# Patient Record
Sex: Male | Born: 1940 | Race: White | Hispanic: No | Marital: Married | Smoking: Former smoker
Health system: Southern US, Community
[De-identification: ages and names within clinical notes are randomized; demographics above are authoritative.]

## PROBLEM LIST (undated history)

## (undated) DIAGNOSIS — F419 Anxiety disorder, unspecified: Secondary | ICD-10-CM

## (undated) DIAGNOSIS — G4730 Sleep apnea, unspecified: Secondary | ICD-10-CM

## (undated) DIAGNOSIS — I509 Heart failure, unspecified: Secondary | ICD-10-CM

## (undated) DIAGNOSIS — I5022 Chronic systolic (congestive) heart failure: Secondary | ICD-10-CM

## (undated) DIAGNOSIS — E079 Disorder of thyroid, unspecified: Secondary | ICD-10-CM

## (undated) DIAGNOSIS — Z95810 Presence of automatic (implantable) cardiac defibrillator: Secondary | ICD-10-CM

## (undated) DIAGNOSIS — J449 Chronic obstructive pulmonary disease, unspecified: Secondary | ICD-10-CM

## (undated) DIAGNOSIS — J069 Acute upper respiratory infection, unspecified: Secondary | ICD-10-CM

## (undated) DIAGNOSIS — H9190 Unspecified hearing loss, unspecified ear: Secondary | ICD-10-CM

## (undated) DIAGNOSIS — C9110 Chronic lymphocytic leukemia of B-cell type not having achieved remission: Secondary | ICD-10-CM

## (undated) DIAGNOSIS — Z9989 Dependence on other enabling machines and devices: Secondary | ICD-10-CM

## (undated) HISTORY — DX: Acute upper respiratory infection, unspecified: J06.9

## (undated) HISTORY — DX: Dependence on other enabling machines and devices: Z99.89

## (undated) HISTORY — DX: Disorder of thyroid, unspecified: E07.9

## (undated) HISTORY — DX: Heart failure, unspecified: I50.9

## (undated) HISTORY — DX: Chronic lymphocytic leukemia of B-cell type not having achieved remission: C91.10

## (undated) HISTORY — DX: Anxiety disorder, unspecified: F41.9

## (undated) HISTORY — DX: Sleep apnea, unspecified: G47.30

---

## 2003-11-20 ENCOUNTER — Other Ambulatory Visit: Payer: Self-pay

## 2004-03-05 ENCOUNTER — Other Ambulatory Visit: Payer: Self-pay

## 2004-09-17 ENCOUNTER — Ambulatory Visit: Payer: Self-pay | Admitting: Internal Medicine

## 2004-09-29 ENCOUNTER — Ambulatory Visit: Payer: Self-pay | Admitting: Internal Medicine

## 2004-11-05 ENCOUNTER — Ambulatory Visit: Payer: Self-pay | Admitting: Cardiology

## 2005-05-15 ENCOUNTER — Ambulatory Visit: Payer: Self-pay | Admitting: Internal Medicine

## 2005-05-30 ENCOUNTER — Ambulatory Visit: Payer: Self-pay | Admitting: Internal Medicine

## 2005-09-11 ENCOUNTER — Ambulatory Visit: Payer: Self-pay

## 2005-10-22 ENCOUNTER — Ambulatory Visit: Payer: Self-pay | Admitting: General Practice

## 2005-11-11 ENCOUNTER — Ambulatory Visit: Payer: Self-pay | Admitting: Internal Medicine

## 2005-11-27 ENCOUNTER — Ambulatory Visit: Payer: Self-pay | Admitting: Internal Medicine

## 2006-07-09 ENCOUNTER — Ambulatory Visit: Payer: Self-pay | Admitting: Internal Medicine

## 2006-07-30 ENCOUNTER — Ambulatory Visit: Payer: Self-pay | Admitting: Internal Medicine

## 2006-11-24 ENCOUNTER — Ambulatory Visit: Payer: Self-pay | Admitting: Internal Medicine

## 2006-11-28 ENCOUNTER — Ambulatory Visit: Payer: Self-pay | Admitting: Internal Medicine

## 2007-05-26 ENCOUNTER — Ambulatory Visit: Payer: Self-pay | Admitting: Internal Medicine

## 2007-05-31 ENCOUNTER — Ambulatory Visit: Payer: Self-pay | Admitting: Internal Medicine

## 2007-10-13 ENCOUNTER — Ambulatory Visit: Payer: Self-pay | Admitting: Internal Medicine

## 2007-10-14 ENCOUNTER — Ambulatory Visit: Payer: Self-pay | Admitting: Internal Medicine

## 2007-10-31 ENCOUNTER — Ambulatory Visit: Payer: Self-pay | Admitting: Internal Medicine

## 2007-11-28 ENCOUNTER — Ambulatory Visit: Payer: Self-pay | Admitting: Internal Medicine

## 2008-04-05 ENCOUNTER — Ambulatory Visit: Payer: Self-pay | Admitting: Internal Medicine

## 2008-04-25 ENCOUNTER — Ambulatory Visit: Payer: Self-pay | Admitting: Internal Medicine

## 2008-05-30 ENCOUNTER — Ambulatory Visit: Payer: Self-pay | Admitting: Internal Medicine

## 2008-06-26 ENCOUNTER — Ambulatory Visit: Payer: Self-pay | Admitting: Internal Medicine

## 2008-06-29 ENCOUNTER — Ambulatory Visit: Payer: Self-pay | Admitting: Internal Medicine

## 2008-10-30 ENCOUNTER — Ambulatory Visit: Payer: Self-pay | Admitting: Internal Medicine

## 2008-11-22 ENCOUNTER — Ambulatory Visit: Payer: Self-pay | Admitting: Internal Medicine

## 2008-11-27 ENCOUNTER — Ambulatory Visit: Payer: Self-pay | Admitting: Internal Medicine

## 2009-07-27 ENCOUNTER — Ambulatory Visit: Payer: Self-pay | Admitting: Internal Medicine

## 2009-09-17 ENCOUNTER — Ambulatory Visit: Payer: Self-pay | Admitting: Internal Medicine

## 2009-09-29 ENCOUNTER — Ambulatory Visit: Payer: Self-pay | Admitting: Internal Medicine

## 2010-01-23 ENCOUNTER — Ambulatory Visit: Payer: Self-pay | Admitting: Internal Medicine

## 2010-01-27 ENCOUNTER — Ambulatory Visit: Payer: Self-pay | Admitting: Internal Medicine

## 2010-07-26 ENCOUNTER — Ambulatory Visit: Payer: Self-pay | Admitting: Internal Medicine

## 2010-07-30 ENCOUNTER — Ambulatory Visit: Payer: Self-pay | Admitting: Internal Medicine

## 2011-01-02 ENCOUNTER — Ambulatory Visit: Payer: Self-pay | Admitting: Internal Medicine

## 2011-01-28 ENCOUNTER — Ambulatory Visit: Payer: Self-pay | Admitting: Internal Medicine

## 2011-06-24 DIAGNOSIS — M169 Osteoarthritis of hip, unspecified: Secondary | ICD-10-CM | POA: Insufficient documentation

## 2011-08-13 ENCOUNTER — Ambulatory Visit: Payer: Self-pay | Admitting: Internal Medicine

## 2011-08-30 ENCOUNTER — Ambulatory Visit: Payer: Self-pay | Admitting: Internal Medicine

## 2012-08-25 ENCOUNTER — Ambulatory Visit: Payer: Self-pay | Admitting: Internal Medicine

## 2012-08-25 LAB — CBC CANCER CENTER
Basophil #: 0 x10 3/mm (ref 0.0–0.1)
Basophil %: 0 %
Eosinophil %: 0.4 %
HCT: 50.3 % (ref 40.0–52.0)
HGB: 16.8 g/dL (ref 13.0–18.0)
Lymphocyte #: 38.2 x10 3/mm — ABNORMAL HIGH (ref 1.0–3.6)
Lymphocyte %: 84.2 %
MCH: 29.4 pg (ref 26.0–34.0)
MCV: 88 fL (ref 80–100)
Monocyte %: 2.3 %
Neutrophil #: 6 x10 3/mm (ref 1.4–6.5)
Platelet: 101 x10 3/mm — ABNORMAL LOW (ref 150–440)
RBC: 5.72 10*6/uL (ref 4.40–5.90)
WBC: 45.4 x10 3/mm — ABNORMAL HIGH (ref 3.8–10.6)

## 2012-08-29 ENCOUNTER — Ambulatory Visit: Payer: Self-pay | Admitting: Internal Medicine

## 2013-08-09 ENCOUNTER — Ambulatory Visit: Payer: Self-pay | Admitting: Family Medicine

## 2013-08-09 LAB — COMPREHENSIVE METABOLIC PANEL
Albumin: 3.9 g/dL (ref 3.4–5.0)
Alkaline Phosphatase: 54 U/L (ref 50–136)
Calcium, Total: 8.3 mg/dL — ABNORMAL LOW (ref 8.5–10.1)
Chloride: 105 mmol/L (ref 98–107)
Co2: 25 mmol/L (ref 21–32)
EGFR (African American): >60
EGFR (Non-African Amer.): >60
Glucose: 111 mg/dL — ABNORMAL HIGH (ref 65–99)
Osmolality: 280 (ref 275–301)
Potassium: 5.1 mmol/L (ref 3.5–5.1)
SGOT(AST): 32 U/L (ref 15–37)
SGPT (ALT): 44 U/L (ref 12–78)
Total Protein: 6.4 g/dL (ref 6.4–8.2)

## 2013-08-09 LAB — URINALYSIS, COMPLETE
Bilirubin,UR: NEGATIVE
Ketone: NEGATIVE
Nitrite: NEGATIVE
Ph: 6.5 (ref 4.5–8.0)
Squamous Epithelial: NONE SEEN
WBC UR: NONE SEEN /HPF (ref 0–5)

## 2013-08-09 LAB — CBC WITH DIFFERENTIAL/PLATELET
HCT: 50.2 % (ref 40.0–52.0)
MCV: 91 fL (ref 80–100)
Monocytes: 2 %
Platelet: 82 10*3/uL — ABNORMAL LOW (ref 150–440)
RDW: 13.9 % (ref 11.5–14.5)
Segmented Neutrophils: 9 %
Variant Lymphocyte - H1-Rlymph: 89 %
WBC: 52.7 10*3/uL — ABNORMAL HIGH (ref 3.8–10.6)

## 2013-08-19 ENCOUNTER — Ambulatory Visit: Payer: Self-pay | Admitting: Internal Medicine

## 2013-08-19 LAB — CBC CANCER CENTER
Basophil #: 0.1 x10 3/mm (ref 0.0–0.1)
Eosinophil #: 0.2 x10 3/mm (ref 0.0–0.7)
Eosinophil %: 0.4 %
HCT: 50.9 % (ref 40.0–52.0)
HGB: 16.1 g/dL (ref 13.0–18.0)
Lymphocyte %: 89.3 %
MCH: 29 pg (ref 26.0–34.0)
MCHC: 31.6 g/dL — ABNORMAL LOW (ref 32.0–36.0)
MCV: 92 fL (ref 80–100)
Neutrophil #: 4.6 x10 3/mm (ref 1.4–6.5)
Neutrophil %: 8.1 %
Platelet: 82 x10 3/mm — ABNORMAL LOW (ref 150–440)
RBC: 5.56 10*6/uL (ref 4.40–5.90)
RDW: 14.1 % (ref 11.5–14.5)
WBC: 56.6 x10 3/mm — ABNORMAL HIGH (ref 3.8–10.6)

## 2013-08-19 LAB — CREATININE, SERUM
Creatinine: 1.19 mg/dL (ref 0.60–1.30)
EGFR (African American): >60

## 2013-08-29 ENCOUNTER — Ambulatory Visit: Payer: Self-pay | Admitting: Internal Medicine

## 2013-09-29 HISTORY — PX: CARDIAC DEFIBRILLATOR PLACEMENT: SHX171

## 2013-11-22 ENCOUNTER — Ambulatory Visit: Payer: Self-pay | Admitting: Internal Medicine

## 2013-11-28 ENCOUNTER — Ambulatory Visit: Payer: Self-pay | Admitting: Internal Medicine

## 2013-11-28 LAB — CBC CANCER CENTER
BASOS ABS: 0 x10 3/mm (ref 0.0–0.1)
Basophil %: 0 %
EOS ABS: 0.2 x10 3/mm (ref 0.0–0.7)
Eosinophil %: 0.3 %
HCT: 52.6 % — AB (ref 40.0–52.0)
HGB: 16.9 g/dL (ref 13.0–18.0)
LYMPHS ABS: 64.4 x10 3/mm — AB (ref 1.0–3.6)
LYMPHS PCT: 89.2 %
MCH: 28.9 pg (ref 26.0–34.0)
MCHC: 32.1 g/dL (ref 32.0–36.0)
MCV: 90 fL (ref 80–100)
Monocyte #: 1.6 x10 3/mm — ABNORMAL HIGH (ref 0.2–1.0)
Monocyte %: 2.3 %
NEUTROS ABS: 5.9 x10 3/mm (ref 1.4–6.5)
Neutrophil %: 8.2 %
Platelet: 85 x10 3/mm — ABNORMAL LOW (ref 150–440)
RBC: 5.85 10*6/uL (ref 4.40–5.90)
RDW: 13.7 % (ref 11.5–14.5)
WBC: 72.1 x10 3/mm — ABNORMAL HIGH (ref 3.8–10.6)

## 2013-12-06 LAB — CBC CANCER CENTER
Basophil #: 0.1 x10 3/mm (ref 0.0–0.1)
Basophil %: 0.2 %
EOS ABS: 0.2 x10 3/mm (ref 0.0–0.7)
Eosinophil %: 0.3 %
HCT: 52 % (ref 40.0–52.0)
HGB: 16.6 g/dL (ref 13.0–18.0)
LYMPHS ABS: 74.2 x10 3/mm — AB (ref 1.0–3.6)
LYMPHS PCT: 89.6 %
MCH: 28.8 pg (ref 26.0–34.0)
MCHC: 32 g/dL (ref 32.0–36.0)
MCV: 90 fL (ref 80–100)
Monocyte #: 1.8 x10 3/mm — ABNORMAL HIGH (ref 0.2–1.0)
Monocyte %: 2.2 %
NEUTROS PCT: 7.7 %
Neutrophil #: 6.4 x10 3/mm (ref 1.4–6.5)
Platelet: 90 x10 3/mm — ABNORMAL LOW (ref 150–440)
RBC: 5.78 10*6/uL (ref 4.40–5.90)
RDW: 13.7 % (ref 11.5–14.5)
WBC: 82.7 x10 3/mm — AB (ref 3.8–10.6)

## 2013-12-13 LAB — CANCER CENTER HEMATOCRIT: HCT: 49.4 % (ref 40.0–52.0)

## 2013-12-20 LAB — CANCER CENTER HEMATOCRIT: HCT: 47.7 % (ref 40.0–52.0)

## 2013-12-27 LAB — CANCER CENTER HEMATOCRIT: HCT: 48.8 % (ref 40.0–52.0)

## 2013-12-28 ENCOUNTER — Ambulatory Visit: Payer: Self-pay | Admitting: Internal Medicine

## 2014-01-03 LAB — CBC CANCER CENTER
BASOS ABS: 0.1 x10 3/mm (ref 0.0–0.1)
BASOS PCT: 0.1 %
EOS ABS: 0.1 x10 3/mm (ref 0.0–0.7)
Eosinophil %: 0.1 %
HCT: 47.8 % (ref 40.0–52.0)
HGB: 15.2 g/dL (ref 13.0–18.0)
LYMPHS ABS: 68.3 x10 3/mm — AB (ref 1.0–3.6)
Lymphocyte %: 89.1 %
MCH: 28.8 pg (ref 26.0–34.0)
MCHC: 31.8 g/dL — AB (ref 32.0–36.0)
MCV: 91 fL (ref 80–100)
MONO ABS: 1.6 x10 3/mm — AB (ref 0.2–1.0)
MONOS PCT: 2.1 %
Neutrophil #: 6.6 x10 3/mm — ABNORMAL HIGH (ref 1.4–6.5)
Neutrophil %: 8.6 %
PLATELETS: 105 x10 3/mm — AB (ref 150–440)
RBC: 5.27 10*6/uL (ref 4.40–5.90)
RDW: 13.9 % (ref 11.5–14.5)
WBC: 76.6 x10 3/mm — AB (ref 3.8–10.6)

## 2014-01-24 LAB — CANCER CENTER HEMATOCRIT: HCT: 47.5 % (ref 40.0–52.0)

## 2014-01-27 ENCOUNTER — Ambulatory Visit: Payer: Self-pay | Admitting: Internal Medicine

## 2014-02-07 DIAGNOSIS — D751 Secondary polycythemia: Secondary | ICD-10-CM | POA: Insufficient documentation

## 2014-02-07 DIAGNOSIS — C9110 Chronic lymphocytic leukemia of B-cell type not having achieved remission: Secondary | ICD-10-CM | POA: Insufficient documentation

## 2014-02-07 DIAGNOSIS — J449 Chronic obstructive pulmonary disease, unspecified: Secondary | ICD-10-CM | POA: Insufficient documentation

## 2014-02-07 DIAGNOSIS — G4733 Obstructive sleep apnea (adult) (pediatric): Secondary | ICD-10-CM | POA: Insufficient documentation

## 2014-02-07 DIAGNOSIS — Z9989 Dependence on other enabling machines and devices: Secondary | ICD-10-CM

## 2014-02-09 LAB — CANCER CENTER HEMATOCRIT: HCT: 45.6 % (ref 40.0–52.0)

## 2014-02-26 ENCOUNTER — Ambulatory Visit: Payer: Self-pay | Admitting: Physician Assistant

## 2014-02-27 ENCOUNTER — Ambulatory Visit: Payer: Self-pay | Admitting: Internal Medicine

## 2014-02-27 ENCOUNTER — Emergency Department: Payer: Self-pay | Admitting: Emergency Medicine

## 2014-02-27 LAB — BASIC METABOLIC PANEL
ANION GAP: 6 — AB (ref 7–16)
BUN: 15 mg/dL (ref 7–18)
CO2: 24 mmol/L (ref 21–32)
Calcium, Total: 8.2 mg/dL — ABNORMAL LOW (ref 8.5–10.1)
Chloride: 103 mmol/L (ref 98–107)
Creatinine: 1.08 mg/dL (ref 0.60–1.30)
EGFR (African American): >60
EGFR (Non-African Amer.): >60
Glucose: 103 mg/dL — ABNORMAL HIGH (ref 65–99)
OSMOLALITY: 267 (ref 275–301)
POTASSIUM: 4.8 mmol/L (ref 3.5–5.1)
Sodium: 133 mmol/L — ABNORMAL LOW (ref 136–145)

## 2014-02-27 LAB — CBC
HCT: 42.8 % (ref 40.0–52.0)
HGB: 13.8 g/dL (ref 13.0–18.0)
MCH: 28 pg (ref 26.0–34.0)
MCHC: 32.3 g/dL (ref 32.0–36.0)
MCV: 87 fL (ref 80–100)
PLATELETS: 119 10*3/uL — AB (ref 150–440)
RBC: 4.93 10*6/uL (ref 4.40–5.90)
RDW: 13.8 % (ref 11.5–14.5)
WBC: 51.4 10*3/uL — AB (ref 3.8–10.6)

## 2014-02-27 LAB — TROPONIN I: Troponin-I: <0.02 ng/mL

## 2014-03-01 ENCOUNTER — Ambulatory Visit: Payer: Self-pay | Admitting: Internal Medicine

## 2014-03-12 DIAGNOSIS — R590 Localized enlarged lymph nodes: Secondary | ICD-10-CM | POA: Insufficient documentation

## 2014-03-28 LAB — CANCER CENTER HEMATOCRIT: HCT: 47.7 % (ref 40.0–52.0)

## 2014-03-29 ENCOUNTER — Ambulatory Visit: Payer: Self-pay | Admitting: Internal Medicine

## 2014-05-08 ENCOUNTER — Ambulatory Visit: Payer: Self-pay | Admitting: Internal Medicine

## 2014-05-08 LAB — CBC CANCER CENTER
BASOS ABS: 0 x10 3/mm (ref 0.0–0.1)
Basophil %: 0 %
Eosinophil #: 0.1 x10 3/mm (ref 0.0–0.7)
Eosinophil %: 0.1 %
HCT: 48.8 % (ref 40.0–52.0)
HGB: 15.3 g/dL (ref 13.0–18.0)
LYMPHS PCT: 87.6 %
Lymphocyte #: 56.5 x10 3/mm — ABNORMAL HIGH (ref 1.0–3.6)
MCH: 26.5 pg (ref 26.0–34.0)
MCHC: 31.4 g/dL — AB (ref 32.0–36.0)
MCV: 85 fL (ref 80–100)
MONO ABS: 1.2 x10 3/mm — AB (ref 0.2–1.0)
MONOS PCT: 1.9 %
NEUTROS ABS: 6.7 x10 3/mm — AB (ref 1.4–6.5)
NEUTROS PCT: 10.4 %
PLATELETS: 103 x10 3/mm — AB (ref 150–440)
RBC: 5.78 10*6/uL (ref 4.40–5.90)
RDW: 15.8 % — ABNORMAL HIGH (ref 11.5–14.5)
WBC: 64.5 x10 3/mm — ABNORMAL HIGH (ref 3.8–10.6)

## 2014-05-08 LAB — CREATININE, SERUM
Creatinine: 1.24 mg/dL (ref 0.60–1.30)
EGFR (African American): >60
EGFR (Non-African Amer.): 58 — ABNORMAL LOW

## 2014-05-26 LAB — CBC CANCER CENTER
BASOS ABS: 0 x10 3/mm (ref 0.0–0.1)
Basophil %: 0.1 %
EOS ABS: 0.2 x10 3/mm (ref 0.0–0.7)
Eosinophil %: 0.4 %
HCT: 44.4 % (ref 40.0–52.0)
HGB: 13.9 g/dL (ref 13.0–18.0)
LYMPHS PCT: 86.6 %
Lymphocyte #: 51.5 x10 3/mm — ABNORMAL HIGH (ref 1.0–3.6)
MCH: 26.8 pg (ref 26.0–34.0)
MCHC: 31.2 g/dL — AB (ref 32.0–36.0)
MCV: 86 fL (ref 80–100)
MONOS PCT: 2.3 %
Monocyte #: 1.3 x10 3/mm — ABNORMAL HIGH (ref 0.2–1.0)
Neutrophil #: 6.3 x10 3/mm (ref 1.4–6.5)
Neutrophil %: 10.6 %
PLATELETS: 111 x10 3/mm — AB (ref 150–440)
RBC: 5.18 10*6/uL (ref 4.40–5.90)
RDW: 16.6 % — ABNORMAL HIGH (ref 11.5–14.5)
WBC: 59.4 x10 3/mm — ABNORMAL HIGH (ref 3.8–10.6)

## 2014-05-30 ENCOUNTER — Ambulatory Visit: Payer: Self-pay | Admitting: Internal Medicine

## 2014-05-30 ENCOUNTER — Ambulatory Visit: Payer: Self-pay | Admitting: Specialist

## 2014-06-14 ENCOUNTER — Ambulatory Visit: Payer: Self-pay | Admitting: Specialist

## 2014-06-19 LAB — CANCER CENTER HEMATOCRIT: HCT: 44.3 % (ref 40.0–52.0)

## 2014-06-29 ENCOUNTER — Ambulatory Visit: Payer: Self-pay | Admitting: Internal Medicine

## 2014-07-13 DIAGNOSIS — I5022 Chronic systolic (congestive) heart failure: Secondary | ICD-10-CM | POA: Insufficient documentation

## 2014-07-24 ENCOUNTER — Inpatient Hospital Stay: Payer: Self-pay | Admitting: Internal Medicine

## 2014-07-24 LAB — COMPREHENSIVE METABOLIC PANEL
ALK PHOS: 53 U/L
Albumin: 4 g/dL (ref 3.4–5.0)
Anion Gap: 8 (ref 7–16)
BUN: 23 mg/dL — ABNORMAL HIGH (ref 7–18)
Bilirubin,Total: 1.1 mg/dL — ABNORMAL HIGH (ref 0.2–1.0)
CREATININE: 1.48 mg/dL — AB (ref 0.60–1.30)
Calcium, Total: 7.9 mg/dL — ABNORMAL LOW (ref 8.5–10.1)
Chloride: 101 mmol/L (ref 98–107)
Co2: 23 mmol/L (ref 21–32)
EGFR (African American): >60
EGFR (Non-African Amer.): 50 — ABNORMAL LOW
Glucose: 117 mg/dL — ABNORMAL HIGH (ref 65–99)
OSMOLALITY: 269 (ref 275–301)
Potassium: 5.3 mmol/L — ABNORMAL HIGH (ref 3.5–5.1)
SGOT(AST): 31 U/L (ref 15–37)
SGPT (ALT): 23 U/L
Sodium: 132 mmol/L — ABNORMAL LOW (ref 136–145)
Total Protein: 6.4 g/dL (ref 6.4–8.2)

## 2014-07-24 LAB — CBC
HCT: 42.3 % (ref 40.0–52.0)
HGB: 12.9 g/dL — AB (ref 13.0–18.0)
MCH: 26.2 pg (ref 26.0–34.0)
MCHC: 30.6 g/dL — ABNORMAL LOW (ref 32.0–36.0)
MCV: 86 fL (ref 80–100)
Platelet: 127 10*3/uL — ABNORMAL LOW (ref 150–440)
RBC: 4.94 10*6/uL (ref 4.40–5.90)
RDW: 15.3 % — AB (ref 11.5–14.5)
WBC: 70.3 10*3/uL — AB (ref 3.8–10.6)

## 2014-07-24 LAB — TROPONIN I
Troponin-I: <0.02 ng/mL
Troponin-I: <0.02 ng/mL
Troponin-I: <0.02 ng/mL

## 2014-07-24 LAB — PRO B NATRIURETIC PEPTIDE: B-Type Natriuretic Peptide: 6967 pg/mL — ABNORMAL HIGH (ref 0–125)

## 2014-07-25 DIAGNOSIS — I428 Other cardiomyopathies: Secondary | ICD-10-CM | POA: Insufficient documentation

## 2014-07-25 LAB — CBC WITH DIFFERENTIAL/PLATELET
BASOS ABS: 0 10*3/uL (ref 0.0–0.1)
Basophil %: 0 %
EOS ABS: 0 10*3/uL (ref 0.0–0.7)
EOS PCT: 0 %
HCT: 41.1 % (ref 40.0–52.0)
HGB: 12.7 g/dL — AB (ref 13.0–18.0)
Lymphocyte #: 65.9 10*3/uL — ABNORMAL HIGH (ref 1.0–3.6)
Lymphocyte %: 85.4 %
MCH: 26.6 pg (ref 26.0–34.0)
MCHC: 31 g/dL — ABNORMAL LOW (ref 32.0–36.0)
MCV: 86 fL (ref 80–100)
MONOS PCT: 0.8 %
Monocyte #: 0.6 x10 3/mm (ref 0.2–1.0)
Neutrophil #: 10.7 10*3/uL — ABNORMAL HIGH (ref 1.4–6.5)
Neutrophil %: 13.8 %
Platelet: 114 10*3/uL — ABNORMAL LOW (ref 150–440)
RBC: 4.79 10*6/uL (ref 4.40–5.90)
RDW: 15.9 % — AB (ref 11.5–14.5)
WBC: 77.2 10*3/uL — ABNORMAL HIGH (ref 3.8–10.6)

## 2014-07-25 LAB — BASIC METABOLIC PANEL
Anion Gap: 11 (ref 7–16)
BUN: 33 mg/dL — ABNORMAL HIGH (ref 7–18)
CREATININE: 1.79 mg/dL — AB (ref 0.60–1.30)
Calcium, Total: 8.3 mg/dL — ABNORMAL LOW (ref 8.5–10.1)
Chloride: 101 mmol/L (ref 98–107)
Co2: 24 mmol/L (ref 21–32)
EGFR (African American): 48 — ABNORMAL LOW
GFR CALC NON AF AMER: 40 — AB
GLUCOSE: 143 mg/dL — AB (ref 65–99)
Osmolality: 282 (ref 275–301)
POTASSIUM: 5.6 mmol/L — AB (ref 3.5–5.1)
SODIUM: 136 mmol/L (ref 136–145)

## 2014-07-25 LAB — TSH: Thyroid Stimulating Horm: 1.4 u[IU]/mL

## 2014-07-25 LAB — LIPID PANEL
Cholesterol: 104 mg/dL (ref 0–200)
HDL: 18 mg/dL — AB (ref 40–60)
Ldl Cholesterol, Calc: 74 mg/dL (ref 0–100)
TRIGLYCERIDES: 59 mg/dL (ref 0–200)
VLDL CHOLESTEROL, CALC: 12 mg/dL (ref 5–40)

## 2014-07-29 LAB — CULTURE, BLOOD (SINGLE)

## 2014-08-23 ENCOUNTER — Ambulatory Visit: Payer: Self-pay | Admitting: Internal Medicine

## 2014-08-23 LAB — CREATININE, SERUM
CREATININE: 1.52 mg/dL — AB (ref 0.60–1.30)
EGFR (Non-African Amer.): 48 — ABNORMAL LOW
GFR CALC AF AMER: 58 — AB

## 2014-08-23 LAB — CBC CANCER CENTER
Basophil #: 0.1 x10 3/mm (ref 0.0–0.1)
Basophil %: 0.2 %
Eosinophil #: 0.1 x10 3/mm (ref 0.0–0.7)
Eosinophil %: 0.1 %
HCT: 40.9 % (ref 40.0–52.0)
HGB: 12.8 g/dL — ABNORMAL LOW (ref 13.0–18.0)
Lymphocyte #: 47.5 x10 3/mm — ABNORMAL HIGH (ref 1.0–3.6)
Lymphocyte %: 84.5 %
MCH: 26.2 pg (ref 26.0–34.0)
MCHC: 31.2 g/dL — ABNORMAL LOW (ref 32.0–36.0)
MCV: 84 fL (ref 80–100)
MONO ABS: 1 x10 3/mm (ref 0.2–1.0)
MONOS PCT: 1.8 %
NEUTROS ABS: 7.5 x10 3/mm — AB (ref 1.4–6.5)
NEUTROS PCT: 13.4 %
Platelet: 113 x10 3/mm — ABNORMAL LOW (ref 150–440)
RBC: 4.89 10*6/uL (ref 4.40–5.90)
RDW: 15.4 % — AB (ref 11.5–14.5)
WBC: 56.1 x10 3/mm — AB (ref 3.8–10.6)

## 2014-08-23 LAB — LACTATE DEHYDROGENASE: LDH: 121 U/L (ref 85–241)

## 2014-08-29 ENCOUNTER — Ambulatory Visit: Payer: Self-pay | Admitting: Internal Medicine

## 2014-09-01 DIAGNOSIS — I480 Paroxysmal atrial fibrillation: Secondary | ICD-10-CM | POA: Insufficient documentation

## 2014-09-18 DIAGNOSIS — I2510 Atherosclerotic heart disease of native coronary artery without angina pectoris: Secondary | ICD-10-CM | POA: Insufficient documentation

## 2014-09-20 ENCOUNTER — Ambulatory Visit: Payer: Self-pay | Admitting: Internal Medicine

## 2014-10-19 ENCOUNTER — Encounter: Payer: Self-pay | Admitting: Internal Medicine

## 2014-10-23 ENCOUNTER — Ambulatory Visit: Payer: Self-pay | Admitting: Internal Medicine

## 2014-10-23 LAB — CANCER CENTER HEMATOCRIT: HCT: 42.1 % (ref 40.0–52.0)

## 2014-10-30 ENCOUNTER — Ambulatory Visit: Payer: Self-pay | Admitting: Internal Medicine

## 2014-12-04 ENCOUNTER — Ambulatory Visit
Admit: 2014-12-04 | Disposition: A | Discharge status: Home / Self Care | Payer: Self-pay | Attending: Internal Medicine | Admitting: Internal Medicine

## 2014-12-29 ENCOUNTER — Ambulatory Visit
Admit: 2014-12-29 | Disposition: A | Discharge status: Home / Self Care | Payer: Self-pay | Attending: Internal Medicine | Admitting: Internal Medicine

## 2015-01-15 LAB — CBC CANCER CENTER
BASOS ABS: 0.1 x10 3/mm (ref 0.0–0.1)
Basophil %: 0.1 %
EOS PCT: 0.2 %
Eosinophil #: 0.2 x10 3/mm (ref 0.0–0.7)
HCT: 45.6 % (ref 40.0–52.0)
HGB: 14.7 g/dL (ref 13.0–18.0)
LYMPHS ABS: 77.7 x10 3/mm — AB (ref 1.0–3.6)
Lymphocyte %: 90.3 %
MCH: 29 pg (ref 26.0–34.0)
MCHC: 32.3 g/dL (ref 32.0–36.0)
MCV: 90 fL (ref 80–100)
MONO ABS: 1.7 x10 3/mm — AB (ref 0.2–1.0)
Monocyte %: 1.9 %
NEUTROS ABS: 6.5 x10 3/mm (ref 1.4–6.5)
Neutrophil %: 7.5 %
Platelet: 82 x10 3/mm — ABNORMAL LOW (ref 150–440)
RBC: 5.09 10*6/uL (ref 4.40–5.90)
RDW: 14.8 % — AB (ref 11.5–14.5)
WBC: 86.2 x10 3/mm — ABNORMAL HIGH (ref 3.8–10.6)

## 2015-01-15 LAB — CREATININE, SERUM
CREATININE: 1.34 mg/dL — AB
GFR CALC NON AF AMER: 52 — AB

## 2015-01-20 NOTE — Discharge Summary (Signed)
PATIENT NAME:  Clifford Herrera, Clifford Herrera MR#:  [id number] DATE OF BIRTH:  [birth year]  DATE OF ADMISSION:  07/24/2014 DATE OF DISCHARGE:  07/25/2014, transfer  PRIMARY CARE PHYSICIAN: Glendon Axe, MD.   INDICATION FOR ADMISSION: Shortness of breath and heart failure.   HISTORY OF PRESENT ILLNESS: The patient was admitted with heart failure, abnormal echocardiogram and chest x-ray. The patient was treated with diuretics, telemetry, and beta blockers. He had slightly decreased blood pressure but his pulse saturations stayed okay. He had renal insufficiency and was treated for possible pneumonia and lung infection. He subsequently underwent cardiac catheter on 10/27 which showed normal coronaries, but severe nonischemic cardiomyopathy. The patient's blood pressure was in the 90s. Because of his low blood pressure and severe heart failure, arrangements were made for transfer to the heart failure center at [facility] and he was transferred to Dr. Shirley Friar for heart failure management, possible AICD placement. The patient accepted transfer and was willing to go for further management and therapy.   TREATMENTS: None.   DIET: Low sodium.   Rehab potential is unknown.   ACTIVITY: Bedrest until evaluated at [facility].   DISPOSITION: The patient is transferred to Dr. Charlane Ferretti at [facility] for a higher level care for severe heart failure. The case discussed with Dr. Candiss Norse, who is his primary physician.     ____________________________ Loran Senters. Clayborn Bigness, MD ddc:at D: 07/25/2014 16:39:15 ET T: 07/25/2014 17:05:13 ET JOB#: [id number]  cc: Joyceann Kruser D. Clayborn Bigness, MD, <Dictator> Yolonda Kida MD ELECTRONICALLY SIGNED 08/21/2014 14:22

## 2015-01-20 NOTE — Consult Note (Signed)
PATIENT NAME:  Clifford Herrera, MCMANAWAY MR#:  [id number] DATE OF BIRTH:  [birth year]  DATE OF CONSULTATION:  07/24/2014  PRIMARY CARE PHYSICIAN: Glendon Axe, MD  CONSULTING PHYSICIAN:  Elif Yonts D. Jatavious Peppard, MD  INDICATION FOR ADMISSION:  Shortness of breath.    HISTORY OF PRESENT ILLNESS:  The patient is a 74 year old white male, recent history of possible pneumonia, being treated by pulmonary, he was admitted back in June.  He states he never really got over it.  Over the last few months, he has not been able to lay down to sleep.  He usually sleeps in a recliner, has been short of breath for the last few months.  Thursday, he was put on Levaquin, some inhaler and he just could not take any more insomnia and shortness of breath was so profound, he came to the Emergency Room for evaluation.  CAT scan in the ER shows no pulmonary emboli, but bilateral, small pleural effusion, patchy airspace opacities.  There was some concern about possible pneumonia, infectious etiology.  He has a history of CLL. BNP was 6967.  He was subsequently admitted for further evaluation and care for his shortness of breath.   PAST MEDICAL HISTORY: Again CLL, sleep apnea, polycythemia, mediastinal adenopathy, chronic obstructive pulmonary disease.   PAST SURGICAL HISTORY: Bilateral hip replacement, tonsillectomy.   ALLERGIES:  None.    FAMILY HISTORY:  Myocardial infarction in his dad, and he had diabetes. Mother died of an MI, also had diabetes.    SOCIAL HISTORY:  Quit smoking 20 years ago. Rare alcohol consumption, drug use.   Still works part-time.  Lives at home with his wife.    REVIEW OF SYSTEMS:  No blackout spells or syncope. No nausea or vomiting. No fever. No chills. Has had some sweats. No weight loss. No weight gain. No hemoptysis or hematemesis. Denies bright red blood per rectum, vision change or hearing change. Denies sputum production. Does have a cough,    and shortness of breath. No significant leg edema.    PHYSICAL EXAMINATION:  VITAL SIGNS: Blood pressure 110/70, pulse elevated 110  HEENT: Normocephalic, atraumatic, Pupils equal and reactive to light.  NECK:  Supple, No significant JVD, bruits or adenopathy.  LUNGS: Bilateral rhonchi in the bases.  HEART: Regular rate PMI slightly  EXTREMITIES:  WNL  normal, normal pulses  LABORATORY DATA:  Chest x-ray mild bilateral, mediastinal adenopathy with trace pleural effusion and bilateral opacities, bilateral lower lobe.  Glucose 117, BUN 23, creatinine 1.48, sodium of 132, potassium 5.3, chloride 101, CO2 of 23, calcium 7.9.  LFTs negative.  White count 70,000, hemoglobin 12.9, hematocrit 42.3, platelet count 127,000. BNP 7000. CT of the chest: No pulmonary embolus, bilateral mild pleural effusion with patchy infiltrates. EKG initially sinus tach at about 130, nonspecific ST changes.    ASSESSMENT:  Shortness of breath, possible heart failure, chronic obstructive pulmonary disease, possible   chronic lymphocytic leukemia, obstructive sleep apnea, obesity, renal insufficiency, mild hyperkalemia.   PLAN:    1.  Recommend further evaluation for possible heart failure, echocardiogram  , recommend Lasix therapy, recommend possibly Coreg.  Consider ACE inhibitor versus hydralazine  because of his insufficiency.   Would probably recommend   evaluation, possibly cardiac catheterization for evaluation of his heart failure with his elevated BNP and abnormal chest x-ray.   2.   Chronic obstructive pulmonary disease, abnormal chest x-ray. Recommend inhalers, consider pulmonary input, consider steroid therapy with antibiotics.  Supplemental oxygen  with pulmonary input and surgical evaluation.  3.  Possible infection, consider antibiotics for possible   with history of cancer.   Consider evaluation by ID and/or oncology, also would consider blood cultures.  4.  Chronic lymphocytic leukemia. Continue current therapy, recommend oncology consult for evaluation and  management. 5.  Renal insufficiency.  Would recommend nephrology input for what appears to be stage III renal insufficiency,  and weight loss.  6.  Hyperkalemia, not sure of the etiology, may be related renal insufficiency. Again, initial recommendations for evaluation is for heart failure. Rule out etiology, coronary artery disease, and then treat medically for now as this mechanical intervention that would be helpful. We will base further evaluation and results       ____________________________ Loran Senters. Clayborn Bigness, MD ddc:DT D: 07/25/2014 08:49:07 ET T: 07/25/2014 13:36:13 ET JOB#: [id number]  cc: Mackenzi Krogh D. Clayborn Bigness, MD, <Dictator> Yolonda Kida MD ELECTRONICALLY SIGNED 08/28/2014 9:46

## 2015-01-20 NOTE — Consult Note (Signed)
PATIENT NAME:  Clifford Herrera, Clifford Herrera MR#:  [id number] DATE OF BIRTH:  21-Sep-1941  DATE OF CONSULTATION:  07/25/2014  REQUESTING PHYSICIAN: Dwayne D. Clayborn Bigness, MD  CONSULTING PHYSICIAN:  Yahir Tavano Lilian Kapur, MD  REASON FOR CONSULTATION: Acute renal failure.   HISTORY OF PRESENT ILLNESS: The patient is a very pleasant 74 year old Caucasian male with past medical history of chronic lymphocytic leukemia, sleep apnea, polycythemia, mediastinal adenopathy, COPD, severe ischemic cardiomyopathy, who presented to [facility] with complaints of shortness of breath. He reports that he has been having ongoing shortness of breath for at least 3 months. He had pneumonia in June and never fully recovered per his report. However, recently he has been having progressive shortness of breath. He was having significant orthopnea as well as paroxysmal nocturnal dyspnea. He also has mild lower extremity edema. Initial workup here revealed that he had very severe heart failure with ejection fraction less than 20%. We were asked to see him given his acute renal failure in the setting of severe cardiomyopathy and plans for cardiac catheterization today.   He underwent cardiac catheterization today; however, normal coronary arteries were found. Therefore, he was subsequently felt to have severe nonischemic cardiomyopathy. He has history of alcohol use. He was given bicarbonate drip as well as infusion with normal saline.   PAST MEDICAL HISTORY: 1.  Chronic lymphocytic leukemia followed by Dr. Ma Hillock. 2.  Obstructive sleep apnea.  3.  Polycythemia.  4.  Mediastinal adenopathy.  5.  COPD. 6.  Severe nonischemic cardiomyopathy, ejection fraction less than 20%.   PAST SURGICAL HISTORY: 1.  Bilateral hip replacement.  2.  Tonsillectomy.   ALLERGIES: No known drug allergies.   CURRENT INPATIENT MEDICATIONS: Include:  1.  Sodium bicarbonate drip.  2.  Alprazolam 0.25 mg p.o. q. 8 hours p.r.n. anxiety.  3.   Aspirin 81 mg daily.  4.  Lasix 20 mg IV b.i.d.  5.  Solu-Medrol 40 mg IV q. 8 h.  6.  Metoprolol 37.5 mg p.o. b.i.d.  7.  Spiriva 1 capsule inhaled daily.  8.  Levaquin 250 mg p.o. q. 24 hours.   SOCIAL HISTORY: The patient lives in [facility] and is married. He quit smoking 20 years ago, but still chews tobacco. He drinks alcohol on occasion. Drinks beer in particular. Denies illicit drug use.   FAMILY HISTORY: The patient's father deceased at age 37 secondary to a myocardial infarction. The patient's father also had diabetes mellitus. The patient's mother also died at age 39 and had history of diabetes mellitus.   REVIEW OF SYSTEMS:  CONSTITUTIONAL: The patient denies fevers, chills, or malaise.  HEENT: Eyes, denies diplopia, blurry vision. Denies headaches, hearing loss. Denies epistaxis, sore throat.  PULMONARY: The patient endorses shortness of breath, but denies hemoptysis.  CARDIOVASCULAR: Denies chest pain, but does have paroxysmal nocturnal dyspnea, as well as orthopnea.  GASTROINTESTINAL: Denies nausea, vomiting, bloody stools. Endorses constipation.  GENITOURINARY: Denies frequency, urgency, or dysuria.  MUSCULOSKELETAL: Denies joint pain, swelling, or redness.  INTEGUMENTARY: Denies skin rashes or lesions.  NEUROLOGIC: Denies focal extremity numbness or weakness.  ENDOCRINE: Denies polyuria, polydipsia, or polyphagia.  HEMATOLOGIC AND LYMPHATIC: Denies easy bruisability, bleeding, or swollen lymph nodes.  ALLERGY AND IMMUNOLOGIC: Denies. He has no allergy  or history of immunodeficiency.  PSYCHIATRIC: Denies depression, bipolar disorder.  PHYSICAL EXAMINATION: VITAL SIGNS: Temperature 97.6, pulse 102, respirations 20, blood pressure 97/65, pulse oximetry 96%.   GENERAL: Reveals a well-developed, well-nourished Caucasian male who appears his stated  age, currently in no acute distress.  HEENT: Normocephalic, atraumatic. Extraocular movements are intact. Pupils  equal, round, and reactive to light. No scleral icterus. Conjunctivae are pink. No epistaxis noted. Gross hearing intact. Oral mucosa moist.  NECK: Supple and without bruits or lymphadenopathy.  LUNGS: Demonstrate minimal basilar rales, and otherwise normal respiratory effort.  HEART: S1, S2. The patient noted to  be slightly tachycardic. No murmurs or rubs appreciated.  ABDOMEN: Soft, nontender, slight distention noted. Bowel sounds present. No rebound or guarding. No gross organomegaly appreciated.  EXTREMITIES: No clubbing or cyanosis noted; 1+ bilateral pitting edema noted.  NEUROLOGIC: The patient is awake, alert, and oriented to time, person, and place. Strength is 5/5 in both upper and lower extremities.  GENITOURINARY: No suprapubic tenderness noted at this time.  MUSCULOSKELETAL: No joint redness, swelling, or tenderness appreciated.  SKIN: Warm and dry. No rashes noted.  PSYCHIATRIC: The patient is with appropriate affect and appears to have good insight into his current illness.   LABORATORY DATA: Sodium 136, potassium 5.6, chloride 101, CO2 of 24, BUN 33, creatinine 1.79, glucose 143. Troponin less than 0.02. TSH 1.40. CBC shows WBC 77.2, hemoglobin 12.7, hematocrit 41, platelets of 114,000. Blood cultures x 2 sets are negative. Urinalysis from 08/09/2013 was negative for protein.   IMPRESSION: This is a 74 year old Caucasian male with past medical history of chronic lymphocytic leukemia, obstructive sleep apnea, polycythemia, mediastinal adenopathy, chronic obstructive pulmonary disease, acute systolic heart failure exacerbation, nonischemic cardiomyopathy, who presented to [facility] with shortness of breath.   PROBLEM LIST: 1.  Acute renal failure.  2.  Hyperkalemia.  3.  Acute systolic heart failure exacerbation.  4.  Chronic lymphocytic leukemia.   PLAN:  The patient presented with a very interesting case. He was found to have new onset acute heart failure  exacerbation. Ejection fraction less than 20%. We suspect that acute renal failure is secondary to altered cardiorenal hemodynamics. He had cardiac catheterization today and was appropriately administered sodium bicarbonate drip. Given his severe acute congestive heart failure, it is acceptable to continue furosemide 20 mg IV b.i.d. If he is still here tomorrow, we will proceed with additional workup including SPEP, UPEP, ANA, and renal ultrasound. He does have some risk for contrast-induced nephropathy and we will need to monitor creatinine trend closely after cardiac catheterization. Would certainly avoid any further nephrotoxin administration or any further contrast administration. Further plan as the patient progresses.   I would like to thank Dr. Clayborn Bigness for this kind referral.    ____________________________ Tama High, MD mnl:LT D: 07/25/2014 20:28:17 ET T: 07/25/2014 20:58:46 ET JOB#: [id number]  cc: Tama High, MD, <Dictator> Mariah Milling Welles Walthall MD ELECTRONICALLY SIGNED 08/03/2014 10:54

## 2015-01-20 NOTE — H&P (Signed)
PATIENT NAME:  Clifford Herrera, Clifford Herrera MR#:  [id number] DATE OF BIRTH:  June 14, 1941  DATE OF ADMISSION:  07/24/2014  PRIMARY CARE PHYSICIAN: Glendon Axe, MD  CHIEF COMPLAINT: Shortness of breath.   HISTORY OF PRESENT ILLNESS: This is a 74 year old man who stated he had pneumonia in June and does not think he got over it. Over the past 3 months, he cannot lie down to sleep. He has to sleep in a recliner. He has been short of breath for the past 3 months. On Thursday, he was put on Levaquin and started on some inhalers. Today, he just could not take it anymore because of the shortness of breath and unable to sleep very well. He came to the ER for further evaluation. In the ER, he had a CAT scan of the chest that showed no pulmonary embolism, bilateral small pleural effusions, and patchy areas of ground glass opacities. Differential includes mild pulmonary edema versus pneumonitis, infectious or inflammatory etiology, mild mediastinal lymph nodes similar in appearance compared to the last exam. The patient with a history of CLL. BNP was elevated at 6967. Hospitalist services was contacted for further evaluation.   PAST MEDICAL HISTORY: CLL, sleep apnea, polycythemia mediastinal adenopathy, COPD.  PAST SURGICAL HISTORY: Bilateral hip replacements, tonsils.   ALLERGIES: No known drug allergies.   MEDICATIONS: The patient is on include Advair Diskus 250/50 one inhalation twice a day, Xanax 0.25 mg once a day at bedtime, aspirin 81 mg daily, ibuprofen 200 mg 2 tablets every 6 hours as needed for pain, Levaquin started on Thursday 750 mg daily, ProAir HFA CFC 2 puffs every 6 hours as needed, Spiriva 1 inhalation daily.   SOCIAL HISTORY: Quit smoking 20 years ago. Rare alcohol. No drug use. 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   FAMILY HISTORY: Father died at 76 of MI, also had diabetes. Mother also died at 20 of a MI, had diabetes.   REVIEW OF SYSTEMS.  CONSTITUTIONAL: Positive for  sweats. No fever or chills. Slow weight loss over time. Decreased appetite, no energy.  EYES: Does wear glasses.  EARS, NOSE, MOUTH AND THROAT: Decreased hearing. Positive for postnasal drip. No sore throat. No difficulty swallowing.  CARDIOVASCULAR: No chest pain. No palpitations.  RESPIRATORY: Positive for shortness of breath, cough occasionally, nonproductive.  GASTROINTESTINAL: Positive for constipation. Last bowel movement this morning. No nausea. No vomiting. No abdominal pain. No bright red blood per rectum. No melena.  GENITOURINARY: No burning on urination or hematuria.  MUSCULOSKELETAL: No joint pain or muscle pain.  INTEGUMENT: No rashes or eruptions.  NEUROLOGIC: Feeling dizzy, but no fainting or blackouts.  PSYCHIATRIC: No anxiety or depression.  ENDOCRINE: No thyroid problems.  HEMATOLOGIC AND LYMPHATIC: Positive for CLL with high white count and recently high hemoglobins requiring blood draws.   PHYSICAL EXAMINATION: VITAL SIGNS: Temperature 97.8, pulse 132, respirations 20, blood pressure 104/76, pulse oximetry 99% on room air.  GENERAL: No respiratory distress.  EYES: Conjunctivae and lids normal. Pupils equal, round, and reactive to light. Extraocular muscles intact. No nystagmus.  EARS, NOSE, MOUTH AND THROAT: Tympanic membranes: No erythema. Nasal mucosa: No erythema. Throat: No erythema. No exudate seen. Lips and gums: No lesions.  NECK: No JVD. No bruits. No lymphadenopathy. No thyromegaly. No thyroid nodules palpated.  RESPIRATORY: Decreased breath sounds bilaterally. Positive rales bilateral bases. No use of accessory muscles to breathe.  CARDIOVASCULAR: S1 and S2, tachycardic. No gallops, rubs, or murmurs heard. Carotid upstroke 2+ bilaterally. No bruits.  EXTREMITIES: Dorsalis pedis pulses 1+ bilaterally, 2+ edema bilateral lower extremities. ABDOMEN: Soft, nontender. No organosplenomegaly. Normoactive bowel sounds. No masses felt.  LYMPHATIC: No lymph nodes in the  neck.  MUSCULOSKELETAL: 2+ edema. No clubbing. No cyanosis.  SKIN: No ulcers or lesions.  NEUROLOGIC: Cranial nerves II through XII grossly intact. Deep tendon reflexes 2+ bilateral lower extremities.  PSYCHIATRIC: The patient is oriented to person, place, and time.   LABORATORY AND RADIOLOGICAL DATA: Chest x-ray: Mild bilateral interstitial thickening, right greater than left, trace right pleural effusion, mild pneumonitis versus interstitial edema. Troponin negative. Glucose 117, BUN 23, creatinine 1.48, sodium 132, potassium 5.3, chloride 101, CO2 23, calcium 7.9. Liver function tests normal range. White blood cell count 70,000. Hemoglobin 12.9, hematocrit 42.3, platelet count of 127,000, BNP 6967. CT scan of the chest: No pulmonary embolism, bilateral small pleural effusions. Patchy areas of ground glass opacities. The differential diagnosis: Included mild pulmonary edema versus pneumonitis, infectious or inflammatory etiology, mild mediastinal lymphadenopathy similar in appearance compared with prior exam.   EKG: Sinus tachycardia at 133 beats per minute, premature ventricular contractions.   ASSESSMENT AND PLAN: 1. Acute congestive heart failure. The patient is unable lie flat for the past 3 months. Swelling of the lower extremities. We will give Lasix 20 mg IV b.i.d., start metoprolol 25 mg twice a day, get an echocardiogram to determine ejection fraction and type of congestive heart failure this is. Continue to monitor closely.  2. Chronic obstructive pulmonary disease exacerbation. The patient states that even with the Advair inhaler, he is having side effects with the steroid. Will do budesonide nebulizers and try to hold off on systemic steroids at this point. Continue the Levaquin that he has had outpatient. We will get sputum cultures and blood cultures drawn already in the Emergency Room.  3. Sleep apnea continue CPAP at night.  4. Systemic inflammatory response syndrome with tachycardia  and leukocytosis. I think the leukocytosis is secondary to the CLL. This is stable, and elevated tachycardia, probably secondary to the patient to be unable to breathe very well. Follow up on cultures.  5. Chronic lymphocytic leukemia, polycythemia, thrombocytopenia similar to previous on follow-up with Dr. Ma Hillock. Continue to monitor while here.  6. Chronic kidney disease, stage 3. Continue to monitor with diuresis.  7. Hyperkalemia. Should improve with the Lasix. Continue to monitor and check in the a.m.   TIME SPENT ON ADMISSION: 55 minutes.   The patient is a full code    ____________________________ Tana Conch. Leslye Peer, MD rjw:dw D: 07/24/2014 11:54:53 ET T: 07/24/2014 13:01:20 ET JOB#: [id number]  cc: Tana Conch. Leslye Peer, MD, <Dictator> Glendon Axe, MD Marisue Brooklyn MD ELECTRONICALLY SIGNED 07/24/2014 17:47

## 2015-02-21 DIAGNOSIS — I340 Nonrheumatic mitral (valve) insufficiency: Secondary | ICD-10-CM | POA: Insufficient documentation

## 2015-03-10 ENCOUNTER — Other Ambulatory Visit: Payer: Self-pay

## 2015-03-10 DIAGNOSIS — D751 Secondary polycythemia: Secondary | ICD-10-CM

## 2015-03-12 ENCOUNTER — Inpatient Hospital Stay: Payer: Medicare Other | Attending: Internal Medicine

## 2015-03-12 ENCOUNTER — Ambulatory Visit: Payer: Medicare Other

## 2015-03-12 DIAGNOSIS — D751 Secondary polycythemia: Secondary | ICD-10-CM

## 2015-03-12 DIAGNOSIS — C9110 Chronic lymphocytic leukemia of B-cell type not having achieved remission: Secondary | ICD-10-CM | POA: Insufficient documentation

## 2015-03-12 LAB — CBC WITH DIFFERENTIAL/PLATELET
BASOS ABS: 0.2 10*3/uL — AB (ref 0–0.1)
Basophils Relative: 0 %
EOS ABS: 0.3 10*3/uL (ref 0–0.7)
Eosinophils Relative: 0 %
HCT: 47.3 % (ref 40.0–52.0)
HEMOGLOBIN: 14.9 g/dL (ref 13.0–18.0)
Lymphocytes Relative: 91 %
Lymphs Abs: 110.4 10*3/uL — ABNORMAL HIGH (ref 1.0–3.6)
MCH: 28.8 pg (ref 26.0–34.0)
MCHC: 31.5 g/dL — AB (ref 32.0–36.0)
MCV: 91.6 fL (ref 80.0–100.0)
Monocytes Absolute: 1.9 10*3/uL — ABNORMAL HIGH (ref 0.2–1.0)
NEUTROS ABS: 9 10*3/uL — AB (ref 1.4–6.5)
PLATELETS: 101 10*3/uL — AB (ref 150–440)
RBC: 5.16 MIL/uL (ref 4.40–5.90)
RDW: 14.4 % (ref 11.5–14.5)
WBC: 121.8 10*3/uL (ref 3.8–10.6)

## 2015-03-19 ENCOUNTER — Other Ambulatory Visit: Payer: Self-pay | Admitting: Internal Medicine

## 2015-03-19 DIAGNOSIS — C9110 Chronic lymphocytic leukemia of B-cell type not having achieved remission: Secondary | ICD-10-CM

## 2015-04-04 ENCOUNTER — Inpatient Hospital Stay: Payer: Medicare Other | Attending: Internal Medicine

## 2015-04-04 ENCOUNTER — Other Ambulatory Visit: Payer: Self-pay | Admitting: Internal Medicine

## 2015-04-04 DIAGNOSIS — C9110 Chronic lymphocytic leukemia of B-cell type not having achieved remission: Secondary | ICD-10-CM | POA: Diagnosis not present

## 2015-04-04 LAB — CBC WITH DIFFERENTIAL/PLATELET
BASOS ABS: 0.2 10*3/uL — AB (ref 0–0.1)
Basophils Relative: 0 %
EOS ABS: 0.2 10*3/uL (ref 0–0.7)
Eosinophils Relative: 0 %
HEMATOCRIT: 44.7 % (ref 40.0–52.0)
Hemoglobin: 14.3 g/dL (ref 13.0–18.0)
Lymphocytes Relative: 92 %
Lymphs Abs: 102.9 10*3/uL — ABNORMAL HIGH (ref 1.0–3.6)
MCH: 29 pg (ref 26.0–34.0)
MCHC: 32 g/dL (ref 32.0–36.0)
MCV: 90.5 fL (ref 80.0–100.0)
Monocytes Absolute: 1.1 10*3/uL — ABNORMAL HIGH (ref 0.2–1.0)
Monocytes Relative: 1 %
NEUTROS ABS: 8 10*3/uL — AB (ref 1.4–6.5)
Platelets: 90 10*3/uL — ABNORMAL LOW (ref 150–440)
RBC: 4.94 MIL/uL (ref 4.40–5.90)
RDW: 14.5 % (ref 11.5–14.5)
WBC: 112.5 10*3/uL — AB (ref 3.8–10.6)

## 2015-04-04 LAB — CREATININE, SERUM
Creatinine, Ser: 1.34 mg/dL — ABNORMAL HIGH (ref 0.61–1.24)
GFR, EST AFRICAN AMERICAN: 59 mL/min — AB (ref >60)
GFR, EST NON AFRICAN AMERICAN: 51 mL/min — AB (ref >60)

## 2015-04-04 LAB — URIC ACID: Uric Acid, Serum: 8.9 mg/dL — ABNORMAL HIGH (ref 4.4–7.6)

## 2015-04-04 LAB — LACTATE DEHYDROGENASE: LDH: 136 U/L (ref 98–192)

## 2015-05-07 ENCOUNTER — Inpatient Hospital Stay: Payer: Medicare Other

## 2015-05-08 ENCOUNTER — Inpatient Hospital Stay: Payer: Medicare Other | Attending: Internal Medicine

## 2015-05-08 DIAGNOSIS — C9110 Chronic lymphocytic leukemia of B-cell type not having achieved remission: Secondary | ICD-10-CM | POA: Diagnosis present

## 2015-05-08 DIAGNOSIS — D751 Secondary polycythemia: Secondary | ICD-10-CM

## 2015-05-08 LAB — HEMATOCRIT: HCT: 42.2 % (ref 40.0–52.0)

## 2015-05-08 LAB — URIC ACID: URIC ACID, SERUM: 8 mg/dL — AB (ref 4.4–7.6)

## 2015-05-11 ENCOUNTER — Telehealth: Payer: Self-pay | Admitting: *Deleted

## 2015-05-11 ENCOUNTER — Other Ambulatory Visit: Payer: Self-pay | Admitting: Internal Medicine

## 2015-05-11 DIAGNOSIS — C9110 Chronic lymphocytic leukemia of B-cell type not having achieved remission: Secondary | ICD-10-CM

## 2015-05-11 MED ORDER — ALLOPURINOL 100 MG PO TABS: 100.0000 mg | ORAL_TABLET | Freq: Every day | ORAL | Status: DC

## 2015-05-11 NOTE — Telephone Encounter (Signed)
Pt is asking if he needs to continue taking Allopurinol. He is not having any symptoms of gout right now.

## 2015-05-11 NOTE — Telephone Encounter (Signed)
Pt informed to get Allopurinol refilled

## 2015-07-02 ENCOUNTER — Inpatient Hospital Stay: Payer: Medicare Other

## 2015-07-03 ENCOUNTER — Inpatient Hospital Stay: Payer: Medicare Other

## 2015-07-03 ENCOUNTER — Inpatient Hospital Stay: Payer: Medicare Other | Attending: Internal Medicine

## 2015-07-03 DIAGNOSIS — D751 Secondary polycythemia: Secondary | ICD-10-CM

## 2015-07-03 DIAGNOSIS — C9110 Chronic lymphocytic leukemia of B-cell type not having achieved remission: Secondary | ICD-10-CM | POA: Insufficient documentation

## 2015-07-03 LAB — URIC ACID: Uric Acid, Serum: 8.1 mg/dL — ABNORMAL HIGH (ref 4.4–7.6)

## 2015-07-03 LAB — HEMATOCRIT: HCT: 43 % (ref 40.0–52.0)

## 2015-07-10 ENCOUNTER — Other Ambulatory Visit: Payer: Self-pay | Admitting: *Deleted

## 2015-07-10 DIAGNOSIS — C9110 Chronic lymphocytic leukemia of B-cell type not having achieved remission: Secondary | ICD-10-CM

## 2015-07-10 MED ORDER — ALLOPURINOL 100 MG PO TABS: 100.0000 mg | ORAL_TABLET | Freq: Every day | ORAL | Status: DC

## 2015-08-17 ENCOUNTER — Other Ambulatory Visit: Payer: Self-pay | Admitting: Physician Assistant

## 2015-08-17 ENCOUNTER — Ambulatory Visit
Admission: RE | Admit: 2015-08-17 | Discharge: 2015-08-17 | Disposition: A | Discharge status: Home / Self Care | Payer: Medicare Other | Source: Ambulatory Visit | Attending: Physician Assistant | Admitting: Physician Assistant

## 2015-08-17 DIAGNOSIS — M79605 Pain in left leg: Secondary | ICD-10-CM | POA: Insufficient documentation

## 2015-08-17 DIAGNOSIS — R600 Localized edema: Secondary | ICD-10-CM

## 2015-08-27 ENCOUNTER — Inpatient Hospital Stay: Payer: Medicare Other

## 2015-08-29 ENCOUNTER — Inpatient Hospital Stay: Payer: Medicare Other

## 2015-08-29 ENCOUNTER — Inpatient Hospital Stay: Payer: Medicare Other | Attending: Internal Medicine | Admitting: *Deleted

## 2015-08-29 DIAGNOSIS — D751 Secondary polycythemia: Secondary | ICD-10-CM | POA: Diagnosis not present

## 2015-08-29 DIAGNOSIS — C9110 Chronic lymphocytic leukemia of B-cell type not having achieved remission: Secondary | ICD-10-CM | POA: Diagnosis present

## 2015-08-29 LAB — HEMATOCRIT: HCT: 41.7 % (ref 40.0–52.0)

## 2015-10-22 ENCOUNTER — Inpatient Hospital Stay: Payer: Medicare Other

## 2015-10-22 ENCOUNTER — Inpatient Hospital Stay: Payer: Medicare Other | Attending: Internal Medicine | Admitting: *Deleted

## 2015-10-22 DIAGNOSIS — D751 Secondary polycythemia: Secondary | ICD-10-CM | POA: Diagnosis not present

## 2015-10-22 DIAGNOSIS — C9110 Chronic lymphocytic leukemia of B-cell type not having achieved remission: Secondary | ICD-10-CM | POA: Insufficient documentation

## 2015-10-22 LAB — HEMATOCRIT: HEMATOCRIT: 41.6 % (ref 40.0–52.0)

## 2015-11-07 ENCOUNTER — Other Ambulatory Visit: Payer: Self-pay | Admitting: *Deleted

## 2015-11-07 DIAGNOSIS — C9110 Chronic lymphocytic leukemia of B-cell type not having achieved remission: Secondary | ICD-10-CM

## 2015-11-07 MED ORDER — ALLOPURINOL 100 MG PO TABS: 100.0000 mg | ORAL_TABLET | Freq: Every day | ORAL | Status: DC

## 2015-11-07 NOTE — Addendum Note (Signed)
Addended by: Betti Cruz on: 11/07/2015 11:54 AM   Modules accepted: Orders, Medications

## 2015-11-07 NOTE — Telephone Encounter (Signed)
Next appt 12/17/15     Ref Range  2wk ago  32mo ago  64mo ago 68mo ago   HCT 40.0 - 52.0 % 41.6 41.7 43.0 42.2       Current order for Phlebotomies Hold if HCT <47. Draw 35ml.

## 2015-11-26 ENCOUNTER — Telehealth: Payer: Self-pay | Admitting: *Deleted

## 2015-11-26 ENCOUNTER — Other Ambulatory Visit: Payer: Self-pay | Admitting: *Deleted

## 2015-11-26 DIAGNOSIS — D751 Secondary polycythemia: Secondary | ICD-10-CM

## 2015-11-26 DIAGNOSIS — C9110 Chronic lymphocytic leukemia of B-cell type not having achieved remission: Secondary | ICD-10-CM

## 2015-11-26 NOTE — Telephone Encounter (Signed)
-----   Message from Clifford Sickle, MD sent at 11/26/2015  4:03 PM EST ----- Regarding: RE: need lab orders Cbc/cmp/ldh- Thx  ----- Message -----    From: Sabino Gasser, RN    Sent: 11/26/2015  12:10 PM      To: Clifford Sickle, MD Subject: need lab orders                                Pt coming to see you in f/u for erythrocytosis and h/o CLL  What labs do you want?  Dr. Ma Hillock wanted a cbc and a creatinine.  I copied part of the last note to help you. The last note is in scm.     1. Chronic lymphocytic leukemia, early stage -  reviewed labs from today and d/w patient. He is doing well clinically without any progressive superficial lymphadenopathy, fevers or night sweats.  CBC shows progression of Leukocytosis/lymphocytosis. 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 ZAP-70 is positive. 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 Do not see the need any treatment at this time, but will monitor closely. He will get CBC and differential at q8 weeks, will see him back at 48 weeks since he is coming for erythrocytosis followup. Have explained that if he has progressive CLL or symptoms, then may need to consider treatment.. 2. 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 On 12/13/13 - COHb 1.5%, serum EPO 6.1, [id number] mutation negative. ? sleep apnea vs myeloproliferative disorder vs secondary (remote history of smoking, chews tobacco). Hct under beter control. Will conitnue to monitor Hematocrit q8 weeks and pursue phlebotomy 300 mL each time if Hct is 47 or higher. Next MD followup in 48 weeks with CBC, Cr, possible phlebotomy.

## 2015-11-26 NOTE — Telephone Encounter (Signed)
Lab values entered per md response.

## 2015-11-29 ENCOUNTER — Inpatient Hospital Stay: Payer: Medicare Other

## 2015-11-29 ENCOUNTER — Inpatient Hospital Stay (HOSPITAL_BASED_OUTPATIENT_CLINIC_OR_DEPARTMENT_OTHER): Payer: Medicare Other | Admitting: Internal Medicine

## 2015-11-29 ENCOUNTER — Encounter: Payer: Self-pay | Admitting: Internal Medicine

## 2015-11-29 ENCOUNTER — Inpatient Hospital Stay: Payer: Medicare Other | Attending: Internal Medicine

## 2015-11-29 VITALS — BP 72/46 | HR 99 | Temp 98.2°F | Resp 18 | Ht 73.0 in | Wt 216.5 lb

## 2015-11-29 DIAGNOSIS — G4730 Sleep apnea, unspecified: Secondary | ICD-10-CM

## 2015-11-29 DIAGNOSIS — D751 Secondary polycythemia: Secondary | ICD-10-CM

## 2015-11-29 DIAGNOSIS — C9110 Chronic lymphocytic leukemia of B-cell type not having achieved remission: Secondary | ICD-10-CM | POA: Diagnosis not present

## 2015-11-29 DIAGNOSIS — Z79899 Other long term (current) drug therapy: Secondary | ICD-10-CM

## 2015-11-29 DIAGNOSIS — R42 Dizziness and giddiness: Secondary | ICD-10-CM | POA: Diagnosis not present

## 2015-11-29 DIAGNOSIS — Z7901 Long term (current) use of anticoagulants: Secondary | ICD-10-CM | POA: Diagnosis not present

## 2015-11-29 DIAGNOSIS — Z87891 Personal history of nicotine dependence: Secondary | ICD-10-CM | POA: Insufficient documentation

## 2015-11-29 DIAGNOSIS — I509 Heart failure, unspecified: Secondary | ICD-10-CM | POA: Insufficient documentation

## 2015-11-29 DIAGNOSIS — I951 Orthostatic hypotension: Secondary | ICD-10-CM | POA: Diagnosis not present

## 2015-11-29 DIAGNOSIS — C8300 Small cell B-cell lymphoma, unspecified site: Secondary | ICD-10-CM

## 2015-11-29 LAB — CBC WITH DIFFERENTIAL/PLATELET
BASOS ABS: 0 10*3/uL (ref 0–0.1)
Basophils Relative: 0 %
EOS ABS: 0 10*3/uL (ref 0–0.7)
Eosinophils Relative: 0 %
HCT: 36.3 % — ABNORMAL LOW (ref 40.0–52.0)
HEMOGLOBIN: 12.4 g/dL — AB (ref 13.0–18.0)
LYMPHS PCT: 86 %
Lymphs Abs: 148.7 10*3/uL — ABNORMAL HIGH (ref 1.0–3.6)
MCH: 30.5 pg (ref 26.0–34.0)
MCHC: 34.1 g/dL (ref 32.0–36.0)
MCV: 89.6 fL (ref 80.0–100.0)
Monocytes Absolute: 3.5 10*3/uL — ABNORMAL HIGH (ref 0.2–1.0)
Monocytes Relative: 2 %
NEUTROS PCT: 12 %
Neutro Abs: 20.7 10*3/uL — ABNORMAL HIGH (ref 1.4–6.5)
Platelets: 91 10*3/uL — ABNORMAL LOW (ref 150–440)
RBC: 4.05 MIL/uL — ABNORMAL LOW (ref 4.40–5.90)
RDW: 14.8 % — ABNORMAL HIGH (ref 11.5–14.5)
WBC: 172.9 10*3/uL — AB (ref 3.8–10.6)

## 2015-11-29 LAB — COMPREHENSIVE METABOLIC PANEL
ALK PHOS: 54 U/L (ref 38–126)
ALT: 14 U/L — ABNORMAL LOW (ref 17–63)
AST: 16 U/L (ref 15–41)
Albumin: 3.8 g/dL (ref 3.5–5.0)
Anion gap: 7 (ref 5–15)
BILIRUBIN TOTAL: 1.3 mg/dL — AB (ref 0.3–1.2)
BUN: 30 mg/dL — ABNORMAL HIGH (ref 6–20)
CALCIUM: 8 mg/dL — AB (ref 8.9–10.3)
CO2: 25 mmol/L (ref 22–32)
Chloride: 95 mmol/L — ABNORMAL LOW (ref 101–111)
Creatinine, Ser: 1.69 mg/dL — ABNORMAL HIGH (ref 0.61–1.24)
GFR calc Af Amer: 44 mL/min — ABNORMAL LOW (ref >60)
GFR, EST NON AFRICAN AMERICAN: 38 mL/min — AB (ref >60)
Glucose, Bld: 128 mg/dL — ABNORMAL HIGH (ref 65–99)
POTASSIUM: 4.1 mmol/L (ref 3.5–5.1)
Sodium: 127 mmol/L — ABNORMAL LOW (ref 135–145)
TOTAL PROTEIN: 6.5 g/dL (ref 6.5–8.1)

## 2015-11-29 LAB — LACTATE DEHYDROGENASE: LDH: 120 U/L (ref 98–192)

## 2015-11-29 NOTE — Progress Notes (Signed)
[place] OFFICE PROGRESS NOTE  Patient Care Team: Glendon Axe, MD as PCP - General (Internal Medicine)   SUMMARY OF HEMATOLOGIC HISTORY:  # CHRONIC LYMPHOCYTIC LEUKEMIA/-  # Erythrocytosis  INTERVAL HISTORY:  This is my first interaction with the patient since I joined the practice September 2016. I reviewed the patient's prior charts/pertinent labs/imaging in detail; findings are summarized above.   A very pleasant 75 year old male patient with above history of chronic lymphocytic leukemia currently on surveillance.   patient denies any unusual  shortness of beath or chest pain. Denies any  Weight loss or  Night sweats. Appetite is good.   chronic shortness of breath; also noted to have dry cough; no sputum. Chronic leg swelling/ Hx of CHF; also complains of dizziness sec to cardiac medications.    REVIEW OF SYSTEMS:  A complete 10 point review of system is done which is negative except mentioned above/history of present illness.   PAST MEDICAL HISTORY :  Past Medical History  Diagnosis Date  . CLL (chronic lymphocytic leukemia) ([place])   . Heart failure ([place])   . Sleep apnea   . CPAP (continuous positive airway pressure) dependence   . Upper respiratory infection     chronic    PAST SURGICAL HISTORY :   Past Surgical History  Procedure Laterality Date  . Cardiac defibrillator placement  2015    FAMILY HISTORY :   Family History  Problem Relation Age of Onset  . Diabetes Mother   . CAD Mother   . Diabetes Father   . CAD Father     SOCIAL HISTORY:   Social History  Substance Use Topics  . Smoking status: Former Research scientist (life sciences)  . Smokeless tobacco: Current User    Types: Chew  . Alcohol Use: Yes     Comment: 1 beer a month    ALLERGIES:  has No Known Allergies.  MEDICATIONS:  Current Outpatient Prescriptions  Medication Sig Dispense Refill  . albuterol (PROVENTIL HFA;VENTOLIN HFA) 108 (90 Base) MCG/ACT inhaler Inhale 2 puffs into the lungs  every 6 (six) hours as needed for wheezing or shortness of breath.    . allopurinol (ZYLOPRIM) 100 MG tablet Take 1 tablet (100 mg total) by mouth daily. 30 tablet 0  . apixaban (ELIQUIS) 5 MG TABS tablet Take 5 mg by mouth 2 (two) times daily.    . fluticasone (FLOVENT HFA) 220 MCG/ACT inhaler Inhale 2 puffs into the lungs 2 (two) times daily.    . furosemide (LASIX) 40 MG tablet Take 40 mg by mouth 2 (two) times daily.    Marland Kitchen ipratropium-albuterol (DUONEB) 0.5-2.5 (3) MG/3ML SOLN Take 3 mLs by nebulization every 6 (six) hours as needed.    Marland Kitchen lisinopril (PRINIVIL,ZESTRIL) 2.5 MG tablet Take 2.5 mg by mouth daily.    . metoprolol succinate (TOPROL-XL) 25 MG 24 hr tablet Take 25 mg by mouth daily.     No current facility-administered medications for this visit.    PHYSICAL EXAMINATION:   BP 72/46 mmHg  Pulse 99  Temp(Src) 98.2 F (36.8 C) (Tympanic)  Resp 18  Ht 6\' 1"  (1.854 m)  Wt 216 lb 7.9 oz (98.2 kg)  BMI 28.57 kg/m2  Filed Weights   11/29/15 1014  Weight: 216 lb 7.9 oz (98.2 kg)    GENERAL: Well-nourished well-developed; Alert, no distress and comfortable.  Accompanied by his wife. EYES: No pallor or icterus OROPHARYNX: no thrush or ulceration. NECK: supple, no masses felt LYMPH:  no palpable lymphadenopathy in  the cervical, axillary or inguinal regions LUNGS: clear to auscultation and  No wheeze or crackles HEART/CVS: regular rate & rhythm and no murmurs; No lower extremity edema ABDOMEN:abdomen soft, non-tender and normal bowel sounds Musculoskeletal:no cyanosis of digits and no clubbing  PSYCH: alert & oriented x 3 with fluent speech NEURO: no focal motor/sensory deficits SKIN:  no rashes or significant lesions  LABORATORY DATA:  I have reviewed the data as listed    Component Value Date/Time   NA 127* 11/29/2015 0935   NA 136 07/25/2014 0414   K 4.1 11/29/2015 0935   K 5.6* 07/25/2014 0414   CL 95* 11/29/2015 0935   CL 101 07/25/2014 0414   CO2 25 11/29/2015  0935   CO2 24 07/25/2014 0414   GLUCOSE 128* 11/29/2015 0935   GLUCOSE 143* 07/25/2014 0414   BUN 30* 11/29/2015 0935   BUN 33* 07/25/2014 0414   CREATININE 1.69* 11/29/2015 0935   CREATININE 1.34* 01/15/2015 0956   CALCIUM 8.0* 11/29/2015 0935   CALCIUM 8.3* 07/25/2014 0414   PROT 6.5 11/29/2015 0935   PROT 6.4 07/24/2014 0937   ALBUMIN 3.8 11/29/2015 0935   ALBUMIN 4.0 07/24/2014 0937   AST 16 11/29/2015 0935   AST 31 07/24/2014 0937   ALT 14* 11/29/2015 0935   ALT 23 07/24/2014 0937   ALKPHOS 54 11/29/2015 0935   ALKPHOS 53 07/24/2014 0937   BILITOT 1.3* 11/29/2015 0935   BILITOT 1.1* 07/24/2014 0937   GFRNONAA 38* 11/29/2015 0935   GFRNONAA 52* 01/15/2015 0956   GFRNONAA 48* 08/23/2014 1016   GFRAA 44* 11/29/2015 0935   GFRAA >60 01/15/2015 0956   GFRAA 58* 08/23/2014 1016    No results found for: SPEP, UPEP  Lab Results  Component Value Date   WBC 172.9* 11/29/2015   NEUTROABS 20.7* 11/29/2015   HGB 12.4* 11/29/2015   HCT 36.3* 11/29/2015   MCV 89.6 11/29/2015   PLT 91* 11/29/2015      Chemistry      Component Value Date/Time   NA 127* 11/29/2015 0935   NA 136 07/25/2014 0414   K 4.1 11/29/2015 0935   K 5.6* 07/25/2014 0414   CL 95* 11/29/2015 0935   CL 101 07/25/2014 0414   CO2 25 11/29/2015 0935   CO2 24 07/25/2014 0414   BUN 30* 11/29/2015 0935   BUN 33* 07/25/2014 0414   CREATININE 1.69* 11/29/2015 0935   CREATININE 1.34* 01/15/2015 0956      Component Value Date/Time   CALCIUM 8.0* 11/29/2015 0935   CALCIUM 8.3* 07/25/2014 0414   ALKPHOS 54 11/29/2015 0935   ALKPHOS 53 07/24/2014 0937   AST 16 11/29/2015 0935   AST 31 07/24/2014 0937   ALT 14* 11/29/2015 0935   ALT 23 07/24/2014 0937   BILITOT 1.3* 11/29/2015 0935   BILITOT 1.1* 07/24/2014 0937         ASSESSMENT & PLAN:   # 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 Today patient's white count is 170,000 [previously July 120];  hemoglobin on 12 platelets 90. Patient continues to be asymptomatic from his CLL at this time. However I would recommend getting a PET scan at this time for further evaluation of his internal abdominal/mediastinal adenopathy. I would recommend checking CLL fish profile; IGV H mutation status also.  # also discussed with patient that he might be a candidate for treatments if he starts developing any new symptoms- the treatment options including rituximab/Gazyva and ibrutinib [ibrutinib the concern given his cardiac issues]  #  cough- question related to allergies recommend Claritin.  # dizziness/orthostatic hypotension-defer to cardiology for titration of the patient's medications.  # the patient will follow-up with me in approximately 4 weeks/PET scan prior/labs 1 week prior.  # 25 minutes face-to-face with the patient discussing the above plan of care; more than 50% of time spent on prognosis/ natural history; counseling and coordination.      Cammie Sickle, MD 11/29/2015 2:45 PM

## 2015-11-29 NOTE — Progress Notes (Signed)
Pt been feeling dizzy. Saw kowalski and he dec. B/p med in 1/2.  B/p sitting 72/46 standing 64/38. Does not cause him to pass out.  He just feels like his head is not right. He has cough over a month at least and had 2 rounds of levaquin and prednisone without helping. kowalkski gave him neb, and that has helped to get some sputum up.

## 2015-12-17 ENCOUNTER — Ambulatory Visit: Payer: Self-pay | Admitting: Internal Medicine

## 2015-12-17 ENCOUNTER — Other Ambulatory Visit: Payer: Self-pay

## 2015-12-20 ENCOUNTER — Inpatient Hospital Stay: Payer: Medicare Other

## 2015-12-20 DIAGNOSIS — C9110 Chronic lymphocytic leukemia of B-cell type not having achieved remission: Secondary | ICD-10-CM

## 2015-12-20 DIAGNOSIS — C8300 Small cell B-cell lymphoma, unspecified site: Secondary | ICD-10-CM

## 2015-12-20 LAB — CBC WITH DIFFERENTIAL/PLATELET
BASOS ABS: 0.1 10*3/uL (ref 0–0.1)
Eosinophils Absolute: 0.3 10*3/uL (ref 0–0.7)
Eosinophils Relative: 0 %
HEMATOCRIT: 39.2 % — AB (ref 40.0–52.0)
HEMOGLOBIN: 12.6 g/dL — AB (ref 13.0–18.0)
Lymphocytes Relative: 90 %
Lymphs Abs: 100.9 10*3/uL — ABNORMAL HIGH (ref 1.0–3.6)
MCH: 29.6 pg (ref 26.0–34.0)
MCHC: 32.2 g/dL (ref 32.0–36.0)
MCV: 92 fL (ref 80.0–100.0)
MONO ABS: 1.5 10*3/uL — AB (ref 0.2–1.0)
Monocytes Relative: 1 %
NEUTROS ABS: 9.9 10*3/uL — AB (ref 1.4–6.5)
Platelets: 113 10*3/uL — ABNORMAL LOW (ref 150–440)
RBC: 4.26 MIL/uL — ABNORMAL LOW (ref 4.40–5.90)
RDW: 15 % — AB (ref 11.5–14.5)
WBC: 112.8 10*3/uL (ref 3.8–10.6)

## 2015-12-20 LAB — COMPREHENSIVE METABOLIC PANEL
ALT: 16 U/L — ABNORMAL LOW (ref 17–63)
AST: 28 U/L (ref 15–41)
Albumin: 4.1 g/dL (ref 3.5–5.0)
Alkaline Phosphatase: 55 U/L (ref 38–126)
Anion gap: 7 (ref 5–15)
BILIRUBIN TOTAL: 0.7 mg/dL (ref 0.3–1.2)
BUN: 27 mg/dL — AB (ref 6–20)
CO2: 26 mmol/L (ref 22–32)
CREATININE: 1.59 mg/dL — AB (ref 0.61–1.24)
Calcium: 8.2 mg/dL — ABNORMAL LOW (ref 8.9–10.3)
Chloride: 99 mmol/L — ABNORMAL LOW (ref 101–111)
GFR, EST AFRICAN AMERICAN: 48 mL/min — AB (ref >60)
GFR, EST NON AFRICAN AMERICAN: 41 mL/min — AB (ref >60)
Glucose, Bld: 142 mg/dL — ABNORMAL HIGH (ref 65–99)
POTASSIUM: 3.6 mmol/L (ref 3.5–5.1)
Sodium: 132 mmol/L — ABNORMAL LOW (ref 135–145)
TOTAL PROTEIN: 6 g/dL — AB (ref 6.5–8.1)

## 2015-12-25 ENCOUNTER — Ambulatory Visit
Admission: RE | Admit: 2015-12-25 | Discharge: 2015-12-25 | Disposition: A | Discharge status: Home / Self Care | Payer: Medicare Other | Source: Ambulatory Visit | Attending: Internal Medicine | Admitting: Internal Medicine

## 2015-12-25 DIAGNOSIS — C9110 Chronic lymphocytic leukemia of B-cell type not having achieved remission: Secondary | ICD-10-CM

## 2015-12-25 DIAGNOSIS — C8300 Small cell B-cell lymphoma, unspecified site: Secondary | ICD-10-CM

## 2015-12-26 ENCOUNTER — Ambulatory Visit
Admission: RE | Admit: 2015-12-26 | Discharge: 2015-12-26 | Disposition: A | Discharge status: Home / Self Care | Payer: Medicare Other | Source: Ambulatory Visit | Attending: Internal Medicine | Admitting: Internal Medicine

## 2015-12-26 DIAGNOSIS — I2510 Atherosclerotic heart disease of native coronary artery without angina pectoris: Secondary | ICD-10-CM | POA: Diagnosis not present

## 2015-12-26 DIAGNOSIS — R161 Splenomegaly, not elsewhere classified: Secondary | ICD-10-CM | POA: Insufficient documentation

## 2015-12-26 DIAGNOSIS — N289 Disorder of kidney and ureter, unspecified: Secondary | ICD-10-CM | POA: Diagnosis not present

## 2015-12-26 DIAGNOSIS — C9110 Chronic lymphocytic leukemia of B-cell type not having achieved remission: Secondary | ICD-10-CM | POA: Insufficient documentation

## 2015-12-26 DIAGNOSIS — C8300 Small cell B-cell lymphoma, unspecified site: Secondary | ICD-10-CM | POA: Diagnosis not present

## 2015-12-26 LAB — MISC LABCORP TEST (SEND OUT): Labcorp test code: 510340

## 2015-12-26 LAB — GLUCOSE, CAPILLARY: GLUCOSE-CAPILLARY: 82 mg/dL (ref 65–99)

## 2015-12-26 MED ORDER — FLUDEOXYGLUCOSE F - 18 (FDG) INJECTION
12.8000 | Freq: Once | INTRAVENOUS | Status: AC | PRN
  Administered 2015-12-26: 12.8 via INTRAVENOUS

## 2015-12-27 ENCOUNTER — Inpatient Hospital Stay (HOSPITAL_BASED_OUTPATIENT_CLINIC_OR_DEPARTMENT_OTHER): Payer: Medicare Other | Admitting: Internal Medicine

## 2015-12-27 VITALS — BP 105/61 | HR 77 | Temp 96.0°F | Resp 18 | Wt 212.7 lb

## 2015-12-27 DIAGNOSIS — Z87891 Personal history of nicotine dependence: Secondary | ICD-10-CM

## 2015-12-27 DIAGNOSIS — G4730 Sleep apnea, unspecified: Secondary | ICD-10-CM

## 2015-12-27 DIAGNOSIS — C9110 Chronic lymphocytic leukemia of B-cell type not having achieved remission: Secondary | ICD-10-CM | POA: Diagnosis not present

## 2015-12-27 DIAGNOSIS — R42 Dizziness and giddiness: Secondary | ICD-10-CM

## 2015-12-27 DIAGNOSIS — Z7901 Long term (current) use of anticoagulants: Secondary | ICD-10-CM

## 2015-12-27 DIAGNOSIS — Z79899 Other long term (current) drug therapy: Secondary | ICD-10-CM

## 2015-12-27 DIAGNOSIS — D751 Secondary polycythemia: Secondary | ICD-10-CM

## 2015-12-27 DIAGNOSIS — I951 Orthostatic hypotension: Secondary | ICD-10-CM | POA: Diagnosis not present

## 2015-12-27 DIAGNOSIS — I509 Heart failure, unspecified: Secondary | ICD-10-CM

## 2015-12-27 NOTE — Progress Notes (Signed)
Patient complains of constant fatigue. Patient here today for PET results.  Would like copy of scan results.

## 2015-12-27 NOTE — Progress Notes (Signed)
[facility] OFFICE PROGRESS NOTE  Patient Care Team: Glendon Axe, MD as PCP - General (Internal Medicine)   SUMMARY OF HEMATOLOGIC HISTORY:  # CHRONIC LYMPHOCYTIC LEUKEMIA/SLL- March 2017-CLL FISH- 95% OF NUCLEI POSITIVE FOR 13Q DELETION  #CHF/CAD; Orthostatic hypotension  INTERVAL HISTORY:  A very pleasant 75 year old male patient with above history of chronic lymphocytic leukemia currently on surveillance- and also comorbid conditions including CHF CAD orthostatic hypotension is here for follow-up/to review PET scans.  Patient has chronic shortness of breath; not any unusual  shortness of beath or chest pain. Denies any  Weight loss or  Night sweats. Appetite is good.  Chronic leg swelling/ Hx of CHF; also complains of dizziness sec to cardiac medications. Orthostasis is improving. No nausea no vomiting.   REVIEW OF SYSTEMS:  A complete 10 point review of system is done which is negative except mentioned above/history of present illness.   PAST MEDICAL HISTORY :  Past Medical History  Diagnosis Date  . CLL (chronic lymphocytic leukemia) ([place])   . Heart failure ([place])   . Sleep apnea   . CPAP (continuous positive airway pressure) dependence   . Upper respiratory infection     chronic    PAST SURGICAL HISTORY :   Past Surgical History  Procedure Laterality Date  . Cardiac defibrillator placement  2015    FAMILY HISTORY :   Family History  Problem Relation Age of Onset  . Diabetes Mother   . CAD Mother   . Diabetes Father   . CAD Father     SOCIAL HISTORY:   Social History  Substance Use Topics  . Smoking status: Former Research scientist (life sciences)  . Smokeless tobacco: Current User    Types: Chew  . Alcohol Use: Yes     Comment: 1 beer a month    ALLERGIES:  has No Known Allergies.  MEDICATIONS:  Current Outpatient Prescriptions  Medication Sig Dispense Refill  . albuterol (PROVENTIL HFA;VENTOLIN HFA) 108 (90 Base) MCG/ACT inhaler Inhale 2 puffs into the lungs  every 6 (six) hours as needed for wheezing or shortness of breath.    . allopurinol (ZYLOPRIM) 100 MG tablet Take 1 tablet (100 mg total) by mouth daily. 30 tablet 0  . amiodarone (PACERONE) 200 MG tablet Take by mouth.    Marland Kitchen apixaban (ELIQUIS) 5 MG TABS tablet Take 5 mg by mouth 2 (two) times daily.    . fluticasone (FLOVENT HFA) 220 MCG/ACT inhaler Inhale 2 puffs into the lungs 2 (two) times daily.    . furosemide (LASIX) 40 MG tablet Take 40 mg by mouth 2 (two) times daily.    Marland Kitchen ipratropium-albuterol (DUONEB) 0.5-2.5 (3) MG/3ML SOLN Take 3 mLs by nebulization every 6 (six) hours as needed.    Marland Kitchen lisinopril (PRINIVIL,ZESTRIL) 2.5 MG tablet Take 2.5 mg by mouth daily.    . metoprolol succinate (TOPROL-XL) 25 MG 24 hr tablet Take 25 mg by mouth daily.    Marland Kitchen SPIRIVA HANDIHALER 18 MCG inhalation capsule     . temazepam (RESTORIL) 7.5 MG capsule      No current facility-administered medications for this visit.    PHYSICAL EXAMINATION:   BP 105/61 mmHg  Pulse 77  Temp(Src) 96 F (35.6 C) (Tympanic)  Resp 18  Wt 212 lb 11.9 oz (96.5 kg)  Filed Weights   12/27/15 1029  Weight: 212 lb 11.9 oz (96.5 kg)    GENERAL: Well-nourished well-developed; Alert, no distress and comfortable.  Accompanied by his wife. EYES: No pallor or  icterus OROPHARYNX: no thrush or ulceration. NECK: supple, no masses felt LYMPH:  no palpable lymphadenopathy in the cervical, axillary or inguinal regions LUNGS: clear to auscultation and  No wheeze or crackles HEART/CVS: regular rate & rhythm and no murmurs; No lower extremity edema ABDOMEN:abdomen soft, non-tender and normal bowel sounds; positive for splenomegaly- just below the umbilicus/left costal margin.  Musculoskeletal:no cyanosis of digits and no clubbing  PSYCH: alert & oriented x 3 with fluent speech NEURO: no focal motor/sensory deficits SKIN:  no rashes or significant lesions  LABORATORY DATA:  I have reviewed the data as listed    Component  Value Date/Time   NA 132* 12/20/2015 1028   NA 136 07/25/2014 0414   K 3.6 12/20/2015 1028   K 5.6* 07/25/2014 0414   CL 99* 12/20/2015 1028   CL 101 07/25/2014 0414   CO2 26 12/20/2015 1028   CO2 24 07/25/2014 0414   GLUCOSE 142* 12/20/2015 1028   GLUCOSE 143* 07/25/2014 0414   BUN 27* 12/20/2015 1028   BUN 33* 07/25/2014 0414   CREATININE 1.59* 12/20/2015 1028   CREATININE 1.34* 01/15/2015 0956   CALCIUM 8.2* 12/20/2015 1028   CALCIUM 8.3* 07/25/2014 0414   PROT 6.0* 12/20/2015 1028   PROT 6.4 07/24/2014 0937   ALBUMIN 4.1 12/20/2015 1028   ALBUMIN 4.0 07/24/2014 0937   AST 28 12/20/2015 1028   AST 31 07/24/2014 0937   ALT 16* 12/20/2015 1028   ALT 23 07/24/2014 0937   ALKPHOS 55 12/20/2015 1028   ALKPHOS 53 07/24/2014 0937   BILITOT 0.7 12/20/2015 1028   BILITOT 1.1* 07/24/2014 0937   GFRNONAA 41* 12/20/2015 1028   GFRNONAA 52* 01/15/2015 0956   GFRNONAA 48* 08/23/2014 1016   GFRAA 48* 12/20/2015 1028   GFRAA >60 01/15/2015 0956   GFRAA 58* 08/23/2014 1016    No results found for: SPEP, UPEP  Lab Results  Component Value Date   WBC 112.8* 12/20/2015   NEUTROABS 9.9* 12/20/2015   HGB 12.6* 12/20/2015   HCT 39.2* 12/20/2015   MCV 92.0 12/20/2015   PLT 113* 12/20/2015      Chemistry      Component Value Date/Time   NA 132* 12/20/2015 1028   NA 136 07/25/2014 0414   K 3.6 12/20/2015 1028   K 5.6* 07/25/2014 0414   CL 99* 12/20/2015 1028   CL 101 07/25/2014 0414   CO2 26 12/20/2015 1028   CO2 24 07/25/2014 0414   BUN 27* 12/20/2015 1028   BUN 33* 07/25/2014 0414   CREATININE 1.59* 12/20/2015 1028   CREATININE 1.34* 01/15/2015 0956      Component Value Date/Time   CALCIUM 8.2* 12/20/2015 1028   CALCIUM 8.3* 07/25/2014 0414   ALKPHOS 55 12/20/2015 1028   ALKPHOS 53 07/24/2014 0937   AST 28 12/20/2015 1028   AST 31 07/24/2014 0937   ALT 16* 12/20/2015 1028   ALT 23 07/24/2014 0937   BILITOT 0.7 12/20/2015 1028   BILITOT 1.1* 07/24/2014 0937          ASSESSMENT & PLAN:   # 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 PET scan shows massive splenomegaly/ mild mediastinal and retroperitoneal/periportal adenopathy 1-2 cm in size. CBC shows white count of 112/improved/hemoglobin 12/platelets 113. Given the patient is asymptomatic from his lymphoma/CLL-and especially the context of his multiple comorbidities- continue surveillance at this time.  # I again  discussed with patient that he might be a candidate for treatments if he starts developing any new  symptoms- the treatment options including rituximab/Gazyva and ibrutinib [ibrutinib- given his cardiac issues]  # dizziness/orthostatic hypotension- followed by cardiology. Currently stable.  # I reviewed the images myself with the patient's family.; He was given a copy of his scans/ labs.  # Patient will follow-up with me in approximately 2 months with labs; need for scans will be based upon his clinical status.   # 25 minutes face-to-face with the patient discussing the above plan of care; more than 50% of time spent on prognosis/ natural history; counseling and coordination.     Cammie Sickle, MD 12/27/2015 11:04 AM

## 2016-02-28 ENCOUNTER — Inpatient Hospital Stay: Payer: Medicare Other | Attending: Internal Medicine

## 2016-02-28 ENCOUNTER — Inpatient Hospital Stay (HOSPITAL_BASED_OUTPATIENT_CLINIC_OR_DEPARTMENT_OTHER): Payer: Medicare Other | Admitting: Internal Medicine

## 2016-02-28 VITALS — BP 98/60 | HR 76 | Temp 97.0°F | Resp 20 | Ht 73.0 in | Wt 214.6 lb

## 2016-02-28 DIAGNOSIS — G4730 Sleep apnea, unspecified: Secondary | ICD-10-CM | POA: Diagnosis not present

## 2016-02-28 DIAGNOSIS — Z7901 Long term (current) use of anticoagulants: Secondary | ICD-10-CM | POA: Insufficient documentation

## 2016-02-28 DIAGNOSIS — M7989 Other specified soft tissue disorders: Secondary | ICD-10-CM | POA: Insufficient documentation

## 2016-02-28 DIAGNOSIS — Z79899 Other long term (current) drug therapy: Secondary | ICD-10-CM | POA: Diagnosis not present

## 2016-02-28 DIAGNOSIS — I509 Heart failure, unspecified: Secondary | ICD-10-CM

## 2016-02-28 DIAGNOSIS — C9110 Chronic lymphocytic leukemia of B-cell type not having achieved remission: Secondary | ICD-10-CM

## 2016-02-28 DIAGNOSIS — D696 Thrombocytopenia, unspecified: Secondary | ICD-10-CM | POA: Diagnosis not present

## 2016-02-28 DIAGNOSIS — Z87891 Personal history of nicotine dependence: Secondary | ICD-10-CM | POA: Insufficient documentation

## 2016-02-28 DIAGNOSIS — R161 Splenomegaly, not elsewhere classified: Secondary | ICD-10-CM | POA: Diagnosis not present

## 2016-02-28 DIAGNOSIS — I951 Orthostatic hypotension: Secondary | ICD-10-CM | POA: Insufficient documentation

## 2016-02-28 DIAGNOSIS — I2510 Atherosclerotic heart disease of native coronary artery without angina pectoris: Secondary | ICD-10-CM | POA: Insufficient documentation

## 2016-02-28 DIAGNOSIS — J398 Other specified diseases of upper respiratory tract: Secondary | ICD-10-CM | POA: Diagnosis not present

## 2016-02-28 LAB — COMPREHENSIVE METABOLIC PANEL
ALK PHOS: 58 U/L (ref 38–126)
ALT: 21 U/L (ref 17–63)
AST: 28 U/L (ref 15–41)
Albumin: 4.4 g/dL (ref 3.5–5.0)
Anion gap: 7 (ref 5–15)
BILIRUBIN TOTAL: 0.9 mg/dL (ref 0.3–1.2)
BUN: 32 mg/dL — ABNORMAL HIGH (ref 6–20)
CHLORIDE: 101 mmol/L (ref 101–111)
CO2: 27 mmol/L (ref 22–32)
Calcium: 8.4 mg/dL — ABNORMAL LOW (ref 8.9–10.3)
Creatinine, Ser: 1.54 mg/dL — ABNORMAL HIGH (ref 0.61–1.24)
GFR, EST AFRICAN AMERICAN: 50 mL/min — AB (ref >60)
GFR, EST NON AFRICAN AMERICAN: 43 mL/min — AB (ref >60)
Glucose, Bld: 115 mg/dL — ABNORMAL HIGH (ref 65–99)
POTASSIUM: 3.8 mmol/L (ref 3.5–5.1)
Sodium: 135 mmol/L (ref 135–145)
Total Protein: 6.4 g/dL — ABNORMAL LOW (ref 6.5–8.1)

## 2016-02-28 LAB — LACTATE DEHYDROGENASE: LDH: 133 U/L (ref 98–192)

## 2016-02-28 LAB — CBC WITH DIFFERENTIAL/PLATELET
BASOS PCT: 0 %
Basophils Absolute: 0 10*3/uL (ref 0–0.1)
EOS PCT: 0 %
Eosinophils Absolute: 0 10*3/uL (ref 0–0.7)
HEMATOCRIT: 42.4 % (ref 40.0–52.0)
HEMOGLOBIN: 13.7 g/dL (ref 13.0–18.0)
LYMPHS PCT: 94 %
Lymphs Abs: 101.4 10*3/uL — ABNORMAL HIGH (ref 1.0–3.6)
MCH: 29.5 pg (ref 26.0–34.0)
MCHC: 32.4 g/dL (ref 32.0–36.0)
MCV: 91.1 fL (ref 80.0–100.0)
MONO ABS: 1.1 10*3/uL — AB (ref 0.2–1.0)
MONOS PCT: 1 %
NEUTROS PCT: 5 %
Neutro Abs: 5.4 10*3/uL (ref 1.4–6.5)
Platelets: 85 10*3/uL — ABNORMAL LOW (ref 150–440)
RBC: 4.66 MIL/uL (ref 4.40–5.90)
RDW: 14.6 % — ABNORMAL HIGH (ref 11.5–14.5)
WBC: 107.9 10*3/uL — AB (ref 3.8–10.6)

## 2016-02-28 NOTE — Progress Notes (Signed)
[facility] OFFICE PROGRESS NOTE  Patient Care Team: Glendon Axe, MD as PCP - General (Internal Medicine)   SUMMARY OF HEMATOLOGIC HISTORY:  # CHRONIC LYMPHOCYTIC LEUKEMIA/SLL- March 2017-CLL FISH- 95% OF NUCLEI POSITIVE FOR 13Q DELETION; March 2017- PET 1-2Cm LN/Splenomegaly. Surveillance  #CHF/CAD s/p Defib- on Eliquis  [Dr.Kowalski]; Orthostatic hypotension  INTERVAL HISTORY:  A very pleasant 75 year old male patient with above history of chronic lymphocytic leukemia currently on surveillance- and also comorbid conditions including CHF CAD orthostatic hypotension is here for follow-up.  Patient denies any unusual night sweats or weight loss. Denies any fevers or frequent infections. Appetite is fair. His chronic mild swelling in the legs not any worse. Mild chronic shortness of breath again not any worse. No nausea no vomiting.  REVIEW OF SYSTEMS:  A complete 10 point review of system is done which is negative except mentioned above/history of present illness.   PAST MEDICAL HISTORY :  Past Medical History  Diagnosis Date  . CLL (chronic lymphocytic leukemia) ([place])   . Heart failure ([place])   . Sleep apnea   . CPAP (continuous positive airway pressure) dependence   . Upper respiratory infection     chronic    PAST SURGICAL HISTORY :   Past Surgical History  Procedure Laterality Date  . Cardiac defibrillator placement  2015    FAMILY HISTORY :   Family History  Problem Relation Age of Onset  . Diabetes Mother   . CAD Mother   . Diabetes Father   . CAD Father     SOCIAL HISTORY:   Social History  Substance Use Topics  . Smoking status: Former Research scientist (life sciences)  . Smokeless tobacco: Current User    Types: Chew  . Alcohol Use: Yes     Comment: 1 beer a month    ALLERGIES:  has No Known Allergies.  MEDICATIONS:  Current Outpatient Prescriptions  Medication Sig Dispense Refill  . albuterol (PROVENTIL HFA;VENTOLIN HFA) 108 (90 Base) MCG/ACT inhaler Inhale 2  puffs into the lungs every 6 (six) hours as needed for wheezing or shortness of breath.    . allopurinol (ZYLOPRIM) 100 MG tablet Take 1 tablet (100 mg total) by mouth daily. 30 tablet 0  . amiodarone (PACERONE) 200 MG tablet Take by mouth.    Marland Kitchen apixaban (ELIQUIS) 5 MG TABS tablet Take 5 mg by mouth 2 (two) times daily.    . fluticasone (FLOVENT HFA) 220 MCG/ACT inhaler Inhale 2 puffs into the lungs 2 (two) times daily.    . furosemide (LASIX) 40 MG tablet Take 40 mg by mouth 2 (two) times daily.    Marland Kitchen ipratropium-albuterol (DUONEB) 0.5-2.5 (3) MG/3ML SOLN Take 3 mLs by nebulization every 6 (six) hours as needed.    Marland Kitchen lisinopril (PRINIVIL,ZESTRIL) 2.5 MG tablet Take 2.5 mg by mouth daily.    . metoprolol succinate (TOPROL-XL) 25 MG 24 hr tablet Take 25 mg by mouth daily.    Marland Kitchen SPIRIVA HANDIHALER 18 MCG inhalation capsule     . temazepam (RESTORIL) 7.5 MG capsule      No current facility-administered medications for this visit.    PHYSICAL EXAMINATION:   BP 98/60 mmHg  Pulse 76  Temp(Src) 97 F (36.1 C) (Tympanic)  Ht 6\' 1"  (1.854 m)  Wt 214 lb 9.9 oz (97.35 kg)  BMI 28.32 kg/m2  Filed Weights   02/28/16 1046  Weight: 214 lb 9.9 oz (97.35 kg)    GENERAL: Well-nourished well-developed; Alert, no distress and comfortable.  Accompanied by  his wife. EYES: No pallor or icterus OROPHARYNX: no thrush or ulceration. NECK: supple, no masses felt LYMPH:  no palpable lymphadenopathy in the cervical, axillary or inguinal regions LUNGS: clear to auscultation and  No wheeze or crackles HEART/CVS: regular rate & rhythm and no murmurs; No lower extremity edema ABDOMEN:abdomen soft, non-tender and normal bowel sounds; positive for splenomegaly- just below the umbilicus/left costal margin.  Musculoskeletal:no cyanosis of digits and no clubbing  PSYCH: alert & oriented x 3 with fluent speech NEURO: no focal motor/sensory deficits SKIN:  no rashes or significant lesions  LABORATORY DATA:  I  have reviewed the data as listed    Component Value Date/Time   NA 132* 12/20/2015 1028   NA 136 07/25/2014 0414   K 3.6 12/20/2015 1028   K 5.6* 07/25/2014 0414   CL 99* 12/20/2015 1028   CL 101 07/25/2014 0414   CO2 26 12/20/2015 1028   CO2 24 07/25/2014 0414   GLUCOSE 142* 12/20/2015 1028   GLUCOSE 143* 07/25/2014 0414   BUN 27* 12/20/2015 1028   BUN 33* 07/25/2014 0414   CREATININE 1.59* 12/20/2015 1028   CREATININE 1.34* 01/15/2015 0956   CALCIUM 8.2* 12/20/2015 1028   CALCIUM 8.3* 07/25/2014 0414   PROT 6.0* 12/20/2015 1028   PROT 6.4 07/24/2014 0937   ALBUMIN 4.1 12/20/2015 1028   ALBUMIN 4.0 07/24/2014 0937   AST 28 12/20/2015 1028   AST 31 07/24/2014 0937   ALT 16* 12/20/2015 1028   ALT 23 07/24/2014 0937   ALKPHOS 55 12/20/2015 1028   ALKPHOS 53 07/24/2014 0937   BILITOT 0.7 12/20/2015 1028   BILITOT 1.1* 07/24/2014 0937   GFRNONAA 41* 12/20/2015 1028   GFRNONAA 52* 01/15/2015 0956   GFRNONAA 48* 08/23/2014 1016   GFRAA 48* 12/20/2015 1028   GFRAA >60 01/15/2015 0956   GFRAA 58* 08/23/2014 1016    No results found for: SPEP, UPEP  Lab Results  Component Value Date   WBC 112.8* 12/20/2015   NEUTROABS 9.9* 12/20/2015   HGB 12.6* 12/20/2015   HCT 39.2* 12/20/2015   MCV 92.0 12/20/2015   PLT 113* 12/20/2015      Chemistry      Component Value Date/Time   NA 132* 12/20/2015 1028   NA 136 07/25/2014 0414   K 3.6 12/20/2015 1028   K 5.6* 07/25/2014 0414   CL 99* 12/20/2015 1028   CL 101 07/25/2014 0414   CO2 26 12/20/2015 1028   CO2 24 07/25/2014 0414   BUN 27* 12/20/2015 1028   BUN 33* 07/25/2014 0414   CREATININE 1.59* 12/20/2015 1028   CREATININE 1.34* 01/15/2015 0956      Component Value Date/Time   CALCIUM 8.2* 12/20/2015 1028   CALCIUM 8.3* 07/25/2014 0414   ALKPHOS 55 12/20/2015 1028   ALKPHOS 53 07/24/2014 0937   AST 28 12/20/2015 1028   AST 31 07/24/2014 0937   ALT 16* 12/20/2015 1028   ALT 23 07/24/2014 0937   BILITOT 0.7  12/20/2015 1028   BILITOT 1.1* 07/24/2014 0937         ASSESSMENT & PLAN:   # 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  #  CBC shows white count of 107/improved/hemoglobin 13/platelets 85;However, most recently patient's platelets have been above 100,000. I would recommend continued surveillance at this time given his multiple comorbidities; and the fact patient does not have any systemic symptoms. I again  reviewed the treatment options including rituximab/Gazyva and ibrutinib [ibrutinib-less preferred given his cardiac issues].   # Worsening thrombocytopenia/ the context of patient's anticoagulation with Eliquis- recommend monitoring the platelet count on a monthly basis. If further drop in the platelets to 50s and 60s- I will discuss with cardiology regarding holding Eliquis if this is feasible. I will give a trial of steroids to improve the platelets; if it does not recommend treatment for the underlying CLL- with above treatment options mentioned.  # Patient will follow-up with me in approximately 2 months with labs; CBC monthly.  # 25 minutes face-to-face with the patient discussing the above plan of care; more than 50% of time spent on prognosis/ natural history; counseling and coordination.     Cammie Sickle, MD 02/28/2016 10:48 AM

## 2016-02-28 NOTE — Progress Notes (Signed)
Patient here for follow up no concerns today. 

## 2016-04-10 ENCOUNTER — Inpatient Hospital Stay: Payer: Medicare Other | Attending: Internal Medicine

## 2016-04-10 DIAGNOSIS — C9111 Chronic lymphocytic leukemia of B-cell type in remission: Secondary | ICD-10-CM | POA: Diagnosis present

## 2016-04-10 DIAGNOSIS — C9110 Chronic lymphocytic leukemia of B-cell type not having achieved remission: Secondary | ICD-10-CM

## 2016-04-10 LAB — CBC WITH DIFFERENTIAL/PLATELET
Basophils Absolute: 0 10*3/uL (ref 0–0.1)
Basophils Relative: 0 %
Eosinophils Absolute: 0.2 10*3/uL (ref 0–0.7)
Eosinophils Relative: 0 %
HCT: 40.2 % (ref 40.0–52.0)
Hemoglobin: 13.2 g/dL (ref 13.0–18.0)
Lymphocytes Relative: 91 %
Lymphs Abs: 87.9 10*3/uL — ABNORMAL HIGH (ref 1.0–3.6)
MCH: 29.7 pg (ref 26.0–34.0)
MCHC: 32.8 g/dL (ref 32.0–36.0)
MCV: 90.5 fL (ref 80.0–100.0)
Monocytes Absolute: 2 10*3/uL — ABNORMAL HIGH (ref 0.2–1.0)
Monocytes Relative: 2 %
Neutro Abs: 6.3 10*3/uL (ref 1.4–6.5)
Neutrophils Relative %: 7 %
Platelets: 72 10*3/uL — ABNORMAL LOW (ref 150–440)
RBC: 4.45 MIL/uL (ref 4.40–5.90)
RDW: 14.5 % (ref 11.5–14.5)
WBC: 96.5 10*3/uL (ref 3.8–10.6)

## 2016-04-11 ENCOUNTER — Telehealth: Payer: Self-pay | Admitting: *Deleted

## 2016-04-11 NOTE — Telephone Encounter (Signed)
-----   Message from Cammie Sickle, MD sent at 04/10/2016  5:46 PM EDT ----- Please inform patient that labs are- stable but platelets are slightly lower. Await labs again next month/follow-up. Thx

## 2016-04-11 NOTE — Telephone Encounter (Signed)
Called patient to inform him labs are stable. Platelets are slightly lower. Will follow up in one month with labs.

## 2016-05-01 ENCOUNTER — Inpatient Hospital Stay (HOSPITAL_BASED_OUTPATIENT_CLINIC_OR_DEPARTMENT_OTHER): Payer: Medicare Other | Admitting: Internal Medicine

## 2016-05-01 ENCOUNTER — Inpatient Hospital Stay: Payer: Medicare Other | Attending: Internal Medicine

## 2016-05-01 VITALS — BP 101/64 | HR 78 | Temp 96.5°F | Resp 18 | Wt 211.1 lb

## 2016-05-01 DIAGNOSIS — I951 Orthostatic hypotension: Secondary | ICD-10-CM

## 2016-05-01 DIAGNOSIS — J398 Other specified diseases of upper respiratory tract: Secondary | ICD-10-CM | POA: Diagnosis not present

## 2016-05-01 DIAGNOSIS — R161 Splenomegaly, not elsewhere classified: Secondary | ICD-10-CM | POA: Insufficient documentation

## 2016-05-01 DIAGNOSIS — Z79899 Other long term (current) drug therapy: Secondary | ICD-10-CM | POA: Diagnosis not present

## 2016-05-01 DIAGNOSIS — Z7901 Long term (current) use of anticoagulants: Secondary | ICD-10-CM | POA: Insufficient documentation

## 2016-05-01 DIAGNOSIS — I509 Heart failure, unspecified: Secondary | ICD-10-CM | POA: Diagnosis not present

## 2016-05-01 DIAGNOSIS — I2510 Atherosclerotic heart disease of native coronary artery without angina pectoris: Secondary | ICD-10-CM | POA: Diagnosis not present

## 2016-05-01 DIAGNOSIS — Z87891 Personal history of nicotine dependence: Secondary | ICD-10-CM

## 2016-05-01 DIAGNOSIS — G4730 Sleep apnea, unspecified: Secondary | ICD-10-CM | POA: Insufficient documentation

## 2016-05-01 DIAGNOSIS — R599 Enlarged lymph nodes, unspecified: Secondary | ICD-10-CM | POA: Diagnosis not present

## 2016-05-01 DIAGNOSIS — C9110 Chronic lymphocytic leukemia of B-cell type not having achieved remission: Secondary | ICD-10-CM

## 2016-05-01 LAB — CBC WITH DIFFERENTIAL/PLATELET
BASOS ABS: 0 10*3/uL (ref 0–0.1)
Eosinophils Absolute: 0.1 10*3/uL (ref 0–0.7)
Eosinophils Relative: 0 %
HEMATOCRIT: 41.2 % (ref 40.0–52.0)
Hemoglobin: 13.6 g/dL (ref 13.0–18.0)
Lymphs Abs: 82.1 10*3/uL — ABNORMAL HIGH (ref 1.0–3.6)
MCH: 29.8 pg (ref 26.0–34.0)
MCHC: 32.9 g/dL (ref 32.0–36.0)
MCV: 90.4 fL (ref 80.0–100.0)
Monocytes Absolute: 2.2 10*3/uL — ABNORMAL HIGH (ref 0.2–1.0)
Monocytes Relative: 2 %
NEUTROS ABS: 8.5 10*3/uL — AB (ref 1.4–6.5)
Platelets: 82 10*3/uL — ABNORMAL LOW (ref 150–440)
RBC: 4.56 MIL/uL (ref 4.40–5.90)
RDW: 14.6 % — ABNORMAL HIGH (ref 11.5–14.5)
WBC: 93 10*3/uL — AB (ref 3.8–10.6)

## 2016-05-01 NOTE — Assessment & Plan Note (Signed)
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 On surveillance.   #  CBC shows white count of 97/improved/hemoglobin 13/platelets 87;on eliquis monitor for now.   # Patient will follow-up with me in approximately 2 months with labs;Marland Kitchen

## 2016-05-01 NOTE — Progress Notes (Signed)
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  Patient overall is doing well. Denies any recent admission the hospital. Denies any chest pain or shortness of breath.  Patient denies any unusual night sweats or weight loss. Denies any fevers or frequent infections. Appetite is fair.   REVIEW OF SYSTEMS:  A complete 10 point review of system is done which is negative except mentioned above/history of present illness.   PAST MEDICAL HISTORY :  Past Medical History:  Diagnosis Date  . CLL (chronic lymphocytic leukemia) ([place])   . CPAP (continuous positive airway pressure) dependence   . Heart failure ([place])   . Sleep apnea   . Upper respiratory infection    chronic    PAST SURGICAL HISTORY :   Past Surgical History:  Procedure Laterality Date  . CARDIAC DEFIBRILLATOR PLACEMENT  2015    FAMILY HISTORY :   Family History  Problem Relation Age of Onset  . Diabetes Mother   . CAD Mother   . Diabetes Father   . CAD Father     SOCIAL HISTORY:   Social History  Substance Use Topics  . Smoking status: Former Research scientist (life sciences)  . Smokeless tobacco: Current User    Types: Chew  . Alcohol use Yes     Comment: 1 beer a month    ALLERGIES:  has No Known Allergies.  MEDICATIONS:  Current  Outpatient Prescriptions  Medication Sig Dispense Refill  . albuterol (PROVENTIL HFA;VENTOLIN HFA) 108 (90 Base) MCG/ACT inhaler Inhale 2 puffs into the lungs every 6 (six) hours as needed for wheezing or shortness of breath.    . allopurinol (ZYLOPRIM) 100 MG tablet Take 1 tablet (100 mg total) by mouth daily. 30 tablet 0  . amiodarone (PACERONE) 200 MG tablet Take by mouth.    Marland Kitchen apixaban (ELIQUIS) 5 MG TABS tablet Take 5 mg by mouth 2 (two) times daily.    . fluticasone (FLOVENT HFA) 220 MCG/ACT inhaler Inhale 2 puffs into the lungs 2 (two) times daily.    . furosemide (LASIX) 40 MG tablet Take 40 mg by mouth 2 (two) times daily.    Marland Kitchen ipratropium-albuterol (DUONEB) 0.5-2.5 (3) MG/3ML SOLN Take 3 mLs by nebulization every 6 (six) hours as needed.    Marland Kitchen lisinopril (PRINIVIL,ZESTRIL) 2.5 MG tablet Take 2.5 mg by mouth daily.    . metoprolol succinate (TOPROL-XL) 25 MG 24 hr tablet Take 25 mg by mouth daily.    Marland Kitchen SPIRIVA HANDIHALER 18 MCG inhalation capsule      No current facility-administered medications for this visit.     PHYSICAL EXAMINATION:   BP 101/64 (BP Location: Left Arm, Patient Position: Sitting)   Pulse 78   Temp (!) 96.5 F (35.8 C) (Tympanic)   Resp 18   Wt 211 lb 2 oz (95.8 kg)   BMI 27.85 kg/m   Filed  Weights   05/01/16 1041  Weight: 211 lb 2 oz (95.8 kg)    GENERAL: Well-nourished well-developed; Alert, no distress and comfortable.  Accompanied by his wife. EYES: No pallor or icterus OROPHARYNX: no thrush or ulceration. NECK: supple, no masses felt LYMPH:  no palpable lymphadenopathy in the cervical, axillary or inguinal regions LUNGS: clear to auscultation and  No wheeze or crackles HEART/CVS: regular rate & rhythm and no murmurs; No lower extremity edema ABDOMEN:abdomen soft, non-tender and normal bowel sounds; positive for splenomegaly- just below the umbilicus/left costal margin.  Musculoskeletal:no cyanosis of digits and no clubbing  PSYCH: alert &  oriented x 3 with fluent speech NEURO: no focal motor/sensory deficits SKIN:  no rashes or significant lesions  LABORATORY DATA:  I have reviewed the data as listed    Component Value Date/Time   NA 135 02/28/2016 1032   NA 136 07/25/2014 0414   K 3.8 02/28/2016 1032   K 5.6 (H) 07/25/2014 0414   CL 101 02/28/2016 1032   CL 101 07/25/2014 0414   CO2 27 02/28/2016 1032   CO2 24 07/25/2014 0414   GLUCOSE 115 (H) 02/28/2016 1032   GLUCOSE 143 (H) 07/25/2014 0414   BUN 32 (H) 02/28/2016 1032   BUN 33 (H) 07/25/2014 0414   CREATININE 1.54 (H) 02/28/2016 1032   CREATININE 1.34 (H) 01/15/2015 0956   CALCIUM 8.4 (L) 02/28/2016 1032   CALCIUM 8.3 (L) 07/25/2014 0414   PROT 6.4 (L) 02/28/2016 1032   PROT 6.4 07/24/2014 0937   ALBUMIN 4.4 02/28/2016 1032   ALBUMIN 4.0 07/24/2014 0937   AST 28 02/28/2016 1032   AST 31 07/24/2014 0937   ALT 21 02/28/2016 1032   ALT 23 07/24/2014 0937   ALKPHOS 58 02/28/2016 1032   ALKPHOS 53 07/24/2014 0937   BILITOT 0.9 02/28/2016 1032   BILITOT 1.1 (H) 07/24/2014 0937   GFRNONAA 43 (L) 02/28/2016 1032   GFRNONAA 52 (L) 01/15/2015 0956   GFRAA 50 (L) 02/28/2016 1032   GFRAA >60 01/15/2015 0956    No results found for: SPEP, UPEP  Lab Results  Component Value Date   WBC 93.0 (HH) 05/01/2016   NEUTROABS 8.5 (H) 05/01/2016   HGB 13.6 05/01/2016   HCT 41.2 05/01/2016   MCV 90.4 05/01/2016   PLT 82 (L) 05/01/2016      Chemistry      Component Value Date/Time   NA 135 02/28/2016 1032   NA 136 07/25/2014 0414   K 3.8 02/28/2016 1032   K 5.6 (H) 07/25/2014 0414   CL 101 02/28/2016 1032   CL 101 07/25/2014 0414   CO2 27 02/28/2016 1032   CO2 24 07/25/2014 0414   BUN 32 (H) 02/28/2016 1032   BUN 33 (H) 07/25/2014 0414   CREATININE 1.54 (H) 02/28/2016 1032   CREATININE 1.34 (H) 01/15/2015 0956      Component Value Date/Time   CALCIUM 8.4 (L) 02/28/2016 1032   CALCIUM 8.3 (L) 07/25/2014 0414   ALKPHOS 58 02/28/2016 1032   ALKPHOS 53  07/24/2014 0937   AST 28 02/28/2016 1032   AST 31 07/24/2014 0937   ALT 21 02/28/2016 1032   ALT 23 07/24/2014 0937   BILITOT 0.9 02/28/2016 1032   BILITOT 1.1 (H) 07/24/2014 0937         ASSESSMENT & PLAN:   Chronic lymphocytic leukemia ([place]) # 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 On surveillance.   #  CBC shows white count of 97/improved/hemoglobin 13/platelets 87;on eliquis monitor for now.   # Patient will follow-up with me in approximately 2 months with labs;Marland Kitchen       Cammie Sickle, MD 05/01/2016 5:41 PM

## 2016-07-03 ENCOUNTER — Inpatient Hospital Stay (HOSPITAL_BASED_OUTPATIENT_CLINIC_OR_DEPARTMENT_OTHER): Payer: Medicare Other | Admitting: Internal Medicine

## 2016-07-03 ENCOUNTER — Inpatient Hospital Stay: Payer: Medicare Other | Attending: Internal Medicine

## 2016-07-03 ENCOUNTER — Encounter: Payer: Self-pay | Admitting: Internal Medicine

## 2016-07-03 ENCOUNTER — Other Ambulatory Visit: Payer: Self-pay

## 2016-07-03 DIAGNOSIS — R161 Splenomegaly, not elsewhere classified: Secondary | ICD-10-CM | POA: Insufficient documentation

## 2016-07-03 DIAGNOSIS — Z87891 Personal history of nicotine dependence: Secondary | ICD-10-CM

## 2016-07-03 DIAGNOSIS — R599 Enlarged lymph nodes, unspecified: Secondary | ICD-10-CM

## 2016-07-03 DIAGNOSIS — C9110 Chronic lymphocytic leukemia of B-cell type not having achieved remission: Secondary | ICD-10-CM

## 2016-07-03 DIAGNOSIS — Z7901 Long term (current) use of anticoagulants: Secondary | ICD-10-CM

## 2016-07-03 DIAGNOSIS — J398 Other specified diseases of upper respiratory tract: Secondary | ICD-10-CM

## 2016-07-03 DIAGNOSIS — I509 Heart failure, unspecified: Secondary | ICD-10-CM | POA: Diagnosis not present

## 2016-07-03 DIAGNOSIS — G4730 Sleep apnea, unspecified: Secondary | ICD-10-CM

## 2016-07-03 DIAGNOSIS — I2510 Atherosclerotic heart disease of native coronary artery without angina pectoris: Secondary | ICD-10-CM

## 2016-07-03 DIAGNOSIS — I951 Orthostatic hypotension: Secondary | ICD-10-CM | POA: Diagnosis not present

## 2016-07-03 LAB — BASIC METABOLIC PANEL
ANION GAP: 9 (ref 5–15)
BUN: 29 mg/dL — ABNORMAL HIGH (ref 6–20)
CHLORIDE: 98 mmol/L — AB (ref 101–111)
CO2: 26 mmol/L (ref 22–32)
Calcium: 8.7 mg/dL — ABNORMAL LOW (ref 8.9–10.3)
Creatinine, Ser: 1.47 mg/dL — ABNORMAL HIGH (ref 0.61–1.24)
GFR calc non Af Amer: 45 mL/min — ABNORMAL LOW (ref >60)
GFR, EST AFRICAN AMERICAN: 52 mL/min — AB (ref >60)
GLUCOSE: 118 mg/dL — AB (ref 65–99)
POTASSIUM: 4.2 mmol/L (ref 3.5–5.1)
Sodium: 133 mmol/L — ABNORMAL LOW (ref 135–145)

## 2016-07-03 LAB — CBC WITH DIFFERENTIAL/PLATELET
BASOS ABS: 0.1 10*3/uL (ref 0–0.1)
BASOS PCT: 0 %
Eosinophils Absolute: 0.2 10*3/uL (ref 0–0.7)
Eosinophils Relative: 0 %
HEMATOCRIT: 42.6 % (ref 40.0–52.0)
HEMOGLOBIN: 13.9 g/dL (ref 13.0–18.0)
LYMPHS PCT: 92 %
Lymphs Abs: 103.9 10*3/uL — ABNORMAL HIGH (ref 1.0–3.6)
MCH: 29.7 pg (ref 26.0–34.0)
MCHC: 32.7 g/dL (ref 32.0–36.0)
MCV: 90.8 fL (ref 80.0–100.0)
MONO ABS: 1.4 10*3/uL — AB (ref 0.2–1.0)
Monocytes Relative: 1 %
NEUTROS ABS: 7.5 10*3/uL — AB (ref 1.4–6.5)
NEUTROS PCT: 7 %
Platelets: 76 10*3/uL — ABNORMAL LOW (ref 150–440)
RBC: 4.69 MIL/uL (ref 4.40–5.90)
RDW: 14.4 % (ref 11.5–14.5)
WBC: 113 10*3/uL (ref 4.0–10.5)

## 2016-07-03 NOTE — Progress Notes (Signed)
No concerns today 

## 2016-07-03 NOTE — Progress Notes (Signed)
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 Surveillance  #CHF/CAD s/p Defib- on Eliquis  [Dr.Kowalski]; Orthostatic hypotension      Chronic lymphocytic leukemia ([place])   02/07/2014 Initial Diagnosis    Chronic lymphocytic leukemia ([place])       INTERVAL HISTORY:  A very pleasant 75 year old male patient with above history of chronic lymphocytic leukemia currently on surveillance- and also comorbid conditions including CHF CAD orthostatic hypotension is here for follow-up.  Patient denies any unusual night sweats or weight loss. Denies any fevers or frequent infections. Appetite is fair. Denies any chest pain or shortness of breath.    REVIEW OF SYSTEMS:  A complete 10 point review of system is done which is negative except mentioned above/history of present illness.   PAST MEDICAL HISTORY :  Past Medical History:  Diagnosis Date  . CLL (chronic lymphocytic leukemia) ([place])   . CPAP (continuous positive airway pressure) dependence   . Heart failure ([place])   . Sleep apnea   . Upper respiratory infection    chronic    PAST SURGICAL HISTORY :   Past Surgical History:  Procedure Laterality Date  . CARDIAC DEFIBRILLATOR PLACEMENT  2015    FAMILY HISTORY :   Family History  Problem Relation Age of Onset  . Diabetes Mother   . CAD Mother   . Diabetes Father   . CAD Father     SOCIAL HISTORY:   Social History  Substance Use Topics  . Smoking status: Former Research scientist (life sciences)  . Smokeless tobacco: Current User    Types: Chew  . Alcohol use Yes     Comment: 1 beer a month    ALLERGIES:  has No Known Allergies.  MEDICATIONS:  Current Outpatient Prescriptions  Medication Sig Dispense Refill  . albuterol  (PROVENTIL HFA;VENTOLIN HFA) 108 (90 Base) MCG/ACT inhaler Inhale 2 puffs into the lungs every 6 (six) hours as needed for wheezing or shortness of breath.    . allopurinol (ZYLOPRIM) 100 MG tablet Take 1 tablet (100 mg total) by mouth daily. 30 tablet 0  . apixaban (ELIQUIS) 5 MG TABS tablet Take 5 mg by mouth 2 (two) times daily.    . fluticasone (FLOVENT HFA) 220 MCG/ACT inhaler Inhale 2 puffs into the lungs 2 (two) times daily.    . furosemide (LASIX) 40 MG tablet Take 40 mg by mouth 2 (two) times daily.    Marland Kitchen ipratropium-albuterol (DUONEB) 0.5-2.5 (3) MG/3ML SOLN Take 3 mLs by nebulization every 6 (six) hours as needed.    Marland Kitchen lisinopril (PRINIVIL,ZESTRIL) 2.5 MG tablet Take 2.5 mg by mouth daily.    . metoprolol succinate (TOPROL-XL) 25 MG 24 hr tablet Take 25 mg by mouth daily.    Marland Kitchen SPIRIVA HANDIHALER 18 MCG inhalation capsule     . amiodarone (PACERONE) 200 MG tablet Take by mouth.    . torsemide (DEMADEX) 20 MG tablet      No current facility-administered medications for this visit.     PHYSICAL EXAMINATION:   BP 99/64 (BP Location: Left Arm, Patient Position: Sitting)   Pulse 76   Temp (!) 95.7 F (35.4 C) (Tympanic)   Resp 15   Ht 6\' 1"  (1.854 m)   Wt 212 lb 3.2 oz (96.3 kg)  BMI 28.00 kg/m   Filed Weights   07/03/16 1111  Weight: 212 lb 3.2 oz (96.3 kg)    GENERAL: Well-nourished well-developed; Alert, no distress and comfortable. He is alone. Walking himself EYES: No pallor or icterus OROPHARYNX: no thrush or ulceration. NECK: supple, no masses felt LYMPH:  no palpable lymphadenopathy in the cervical, axillary or inguinal regions LUNGS: clear to auscultation and  No wheeze or crackles HEART/CVS: regular rate & rhythm and no murmurs; No lower extremity edema ABDOMEN:abdomen soft, non-tender and normal bowel sounds; positive for splenomegaly- just below the umbilicus/left costal margin.  Musculoskeletal:no cyanosis of digits and no clubbing  PSYCH: alert & oriented  x 3 with fluent speech NEURO: no focal motor/sensory deficits SKIN:  no rashes or significant lesions  LABORATORY DATA:  I have reviewed the data as listed    Component Value Date/Time   NA 133 (L) 07/03/2016 1050   NA 136 07/25/2014 0414   K 4.2 07/03/2016 1050   K 5.6 (H) 07/25/2014 0414   CL 98 (L) 07/03/2016 1050   CL 101 07/25/2014 0414   CO2 26 07/03/2016 1050   CO2 24 07/25/2014 0414   GLUCOSE 118 (H) 07/03/2016 1050   GLUCOSE 143 (H) 07/25/2014 0414   BUN 29 (H) 07/03/2016 1050   BUN 33 (H) 07/25/2014 0414   CREATININE 1.47 (H) 07/03/2016 1050   CREATININE 1.34 (H) 01/15/2015 0956   CALCIUM 8.7 (L) 07/03/2016 1050   CALCIUM 8.3 (L) 07/25/2014 0414   PROT 6.4 (L) 02/28/2016 1032   PROT 6.4 07/24/2014 0937   ALBUMIN 4.4 02/28/2016 1032   ALBUMIN 4.0 07/24/2014 0937   AST 28 02/28/2016 1032   AST 31 07/24/2014 0937   ALT 21 02/28/2016 1032   ALT 23 07/24/2014 0937   ALKPHOS 58 02/28/2016 1032   ALKPHOS 53 07/24/2014 0937   BILITOT 0.9 02/28/2016 1032   BILITOT 1.1 (H) 07/24/2014 0937   GFRNONAA 45 (L) 07/03/2016 1050   GFRNONAA 52 (L) 01/15/2015 0956   GFRAA 52 (L) 07/03/2016 1050   GFRAA >60 01/15/2015 0956    No results found for: SPEP, UPEP  Lab Results  Component Value Date   WBC 113.0 (HH) 07/03/2016   NEUTROABS 7.5 (H) 07/03/2016   HGB 13.9 07/03/2016   HCT 42.6 07/03/2016   MCV 90.8 07/03/2016   PLT 76 (L) 07/03/2016      Chemistry      Component Value Date/Time   NA 133 (L) 07/03/2016 1050   NA 136 07/25/2014 0414   K 4.2 07/03/2016 1050   K 5.6 (H) 07/25/2014 0414   CL 98 (L) 07/03/2016 1050   CL 101 07/25/2014 0414   CO2 26 07/03/2016 1050   CO2 24 07/25/2014 0414   BUN 29 (H) 07/03/2016 1050   BUN 33 (H) 07/25/2014 0414   CREATININE 1.47 (H) 07/03/2016 1050   CREATININE 1.34 (H) 01/15/2015 0956      Component Value Date/Time   CALCIUM 8.7 (L) 07/03/2016 1050   CALCIUM 8.3 (L) 07/25/2014 0414   ALKPHOS 58 02/28/2016 1032    ALKPHOS 53 07/24/2014 0937   AST 28 02/28/2016 1032   AST 31 07/24/2014 0937   ALT 21 02/28/2016 1032   ALT 23 07/24/2014 0937   BILITOT 0.9 02/28/2016 1032   BILITOT 1.1 (H) 07/24/2014 0937         ASSESSMENT & PLAN:   Chronic lymphocytic leukemia ([place]) # 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 March- 2017-  PET scan shows massive splenomegaly/ mild mediastinal and RP/periportal adenopathy 1-2 cm in size. On surveillance.   #  CBC shows white count of  113 /increasing/hemoglobin 13.9/platelets 78 [stable];on eliquis monitor for now. Discussed with the patient that he will need start treatment and he gets symptomatic/or significant changes in this hemoglobin a platelet count.  # Patient will follow-up with me in approximately 2 months with labs. Check IGVH status- drawn at next visit.      Cammie Sickle, MD 07/04/2016 7:44 AM

## 2016-07-03 NOTE — Assessment & Plan Note (Addendum)
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 March- 2017- PET scan shows massive splenomegaly/ mild mediastinal and RP/periportal adenopathy 1-2 cm in size. On surveillance.   #  CBC shows white count of  113 /increasing/hemoglobin 13.9/platelets 78 [stable];on eliquis monitor for now. Discussed with the patient that he will need start treatment and he gets symptomatic/or significant changes in this hemoglobin a platelet count.  # Patient will follow-up with me in approximately 2 months with labs. Check IGVH status- drawn at next visit.

## 2016-09-04 ENCOUNTER — Inpatient Hospital Stay: Payer: Medicare Other | Attending: Internal Medicine | Admitting: *Deleted

## 2016-09-04 ENCOUNTER — Inpatient Hospital Stay (HOSPITAL_BASED_OUTPATIENT_CLINIC_OR_DEPARTMENT_OTHER): Payer: Medicare Other | Admitting: Internal Medicine

## 2016-09-04 ENCOUNTER — Encounter: Payer: Self-pay | Admitting: Internal Medicine

## 2016-09-04 VITALS — BP 98/58 | HR 73 | Temp 95.8°F | Wt 219.0 lb

## 2016-09-04 DIAGNOSIS — N400 Enlarged prostate without lower urinary tract symptoms: Secondary | ICD-10-CM

## 2016-09-04 DIAGNOSIS — J398 Other specified diseases of upper respiratory tract: Secondary | ICD-10-CM

## 2016-09-04 DIAGNOSIS — I509 Heart failure, unspecified: Secondary | ICD-10-CM | POA: Diagnosis not present

## 2016-09-04 DIAGNOSIS — I951 Orthostatic hypotension: Secondary | ICD-10-CM | POA: Insufficient documentation

## 2016-09-04 DIAGNOSIS — R599 Enlarged lymph nodes, unspecified: Secondary | ICD-10-CM | POA: Insufficient documentation

## 2016-09-04 DIAGNOSIS — Z87891 Personal history of nicotine dependence: Secondary | ICD-10-CM | POA: Insufficient documentation

## 2016-09-04 DIAGNOSIS — Z79899 Other long term (current) drug therapy: Secondary | ICD-10-CM | POA: Insufficient documentation

## 2016-09-04 DIAGNOSIS — R161 Splenomegaly, not elsewhere classified: Secondary | ICD-10-CM

## 2016-09-04 DIAGNOSIS — I2510 Atherosclerotic heart disease of native coronary artery without angina pectoris: Secondary | ICD-10-CM

## 2016-09-04 DIAGNOSIS — C9111 Chronic lymphocytic leukemia of B-cell type in remission: Secondary | ICD-10-CM | POA: Diagnosis not present

## 2016-09-04 DIAGNOSIS — C9110 Chronic lymphocytic leukemia of B-cell type not having achieved remission: Secondary | ICD-10-CM

## 2016-09-04 DIAGNOSIS — G4730 Sleep apnea, unspecified: Secondary | ICD-10-CM | POA: Insufficient documentation

## 2016-09-04 DIAGNOSIS — Z7901 Long term (current) use of anticoagulants: Secondary | ICD-10-CM | POA: Insufficient documentation

## 2016-09-04 DIAGNOSIS — C8303 Small cell B-cell lymphoma, intra-abdominal lymph nodes: Secondary | ICD-10-CM | POA: Insufficient documentation

## 2016-09-04 DIAGNOSIS — Z95810 Presence of automatic (implantable) cardiac defibrillator: Secondary | ICD-10-CM | POA: Diagnosis not present

## 2016-09-04 LAB — COMPREHENSIVE METABOLIC PANEL
ALBUMIN: 4.1 g/dL (ref 3.5–5.0)
ALK PHOS: 61 U/L (ref 38–126)
ALT: 19 U/L (ref 17–63)
ANION GAP: 7 (ref 5–15)
AST: 26 U/L (ref 15–41)
BILIRUBIN TOTAL: 0.5 mg/dL (ref 0.3–1.2)
BUN: 23 mg/dL — ABNORMAL HIGH (ref 6–20)
CALCIUM: 8.3 mg/dL — AB (ref 8.9–10.3)
CO2: 27 mmol/L (ref 22–32)
Chloride: 99 mmol/L — ABNORMAL LOW (ref 101–111)
Creatinine, Ser: 1.24 mg/dL (ref 0.61–1.24)
GFR calc non Af Amer: 55 mL/min — ABNORMAL LOW (ref >60)
GLUCOSE: 106 mg/dL — AB (ref 65–99)
Potassium: 4.5 mmol/L (ref 3.5–5.1)
Sodium: 133 mmol/L — ABNORMAL LOW (ref 135–145)
TOTAL PROTEIN: 6.1 g/dL — AB (ref 6.5–8.1)

## 2016-09-04 LAB — CBC WITH DIFFERENTIAL/PLATELET
BASOS PCT: 0 %
Basophils Absolute: 0.4 10*3/uL — ABNORMAL HIGH (ref 0–0.1)
EOS ABS: 0.2 10*3/uL (ref 0–0.7)
EOS PCT: 0 %
HCT: 40.7 % (ref 40.0–52.0)
HEMOGLOBIN: 13.3 g/dL (ref 13.0–18.0)
LYMPHS ABS: 89.4 10*3/uL — AB (ref 1.0–3.6)
Lymphocytes Relative: 91 %
MCH: 29.4 pg (ref 26.0–34.0)
MCHC: 32.6 g/dL (ref 32.0–36.0)
MCV: 90.1 fL (ref 80.0–100.0)
MONO ABS: 2.2 10*3/uL — AB (ref 0.2–1.0)
MONOS PCT: 2 %
Neutro Abs: 6.8 10*3/uL — ABNORMAL HIGH (ref 1.4–6.5)
Neutrophils Relative %: 7 %
Platelets: 76 10*3/uL — ABNORMAL LOW (ref 150–440)
RBC: 4.52 MIL/uL (ref 4.40–5.90)
RDW: 14.2 % (ref 11.5–14.5)
WBC: 98.9 10*3/uL (ref 3.8–10.6)

## 2016-09-04 LAB — LACTATE DEHYDROGENASE: LDH: 127 U/L (ref 98–192)

## 2016-09-04 NOTE — Assessment & Plan Note (Addendum)
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 March- 2017- PET scan shows massive splenomegaly/ mild mediastinal and RP/periportal adenopathy 1-2 cm in size. On surveillance. Repeat Scan in 3 months.   #  CBC shows white count of  98 /Hemoglobin 13.9/platelets 78 [stable];on eliquis monitor for now. Discussed with the patient that he will need start treatment and he gets symptomatic/or significant changes in this hemoglobin a platelet count.  # Prostate enlargement- PSA check.   # Patient will follow-up with me in approximately 3 months with labs. Scan/labs prior.

## 2016-09-04 NOTE — Progress Notes (Signed)
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 Surveillance  #CHF/CAD s/p Defib- on Eliquis  [Dr.Kowalski]; Orthostatic hypotension      Chronic lymphocytic leukemia ([place])   02/07/2014 Initial Diagnosis    Chronic lymphocytic leukemia ([place])       INTERVAL HISTORY:  A very pleasant 75 year old male patient with above history of chronic lymphocytic leukemia currently on surveillance Is here for follow-up.  Patient denies any unusual night sweats or weight loss. Denies any fevers or frequent infections. Appetite is fair. Denies any chest pain or shortness of breath. No recent admission the hospital for CHF for COPD. No bleeding noted.  Patient does note easy bruising. Also notes that he has to urinates few times at night.    REVIEW OF SYSTEMS:  A complete 10 point review of system is done which is negative except mentioned above/history of present illness.   PAST MEDICAL HISTORY :  Past Medical History:  Diagnosis Date  . CLL (chronic lymphocytic leukemia) ([place])   . CPAP (continuous positive airway pressure) dependence   . Heart failure ([place])   . Sleep apnea   . Upper respiratory infection    chronic    PAST SURGICAL HISTORY :   Past Surgical History:  Procedure Laterality Date  . CARDIAC DEFIBRILLATOR PLACEMENT  2015    FAMILY HISTORY :   Family History  Problem Relation Age of Onset  . Diabetes Mother   . CAD Mother   . Diabetes Father   . CAD Father     SOCIAL HISTORY:   Social History  Substance Use Topics  . Smoking status: Former Research scientist (life sciences)  . Smokeless tobacco: Current User    Types: Chew  . Alcohol use Yes     Comment: 1 beer a month    ALLERGIES:  has No Known Allergies.  MEDICATIONS:   Current Outpatient Prescriptions  Medication Sig Dispense Refill  . albuterol (PROVENTIL HFA;VENTOLIN HFA) 108 (90 Base) MCG/ACT inhaler Inhale 2 puffs into the lungs every 6 (six) hours as needed for wheezing or shortness of breath.    . allopurinol (ZYLOPRIM) 100 MG tablet Take 1 tablet (100 mg total) by mouth daily. 30 tablet 0  . apixaban (ELIQUIS) 5 MG TABS tablet Take 5 mg by mouth 2 (two) times daily.    . fluticasone (FLOVENT HFA) 220 MCG/ACT inhaler Inhale 2 puffs into the lungs 2 (two) times daily.    . furosemide (LASIX) 40 MG tablet Take 40 mg by mouth 2 (two) times daily.    Marland Kitchen ipratropium-albuterol (DUONEB) 0.5-2.5 (3) MG/3ML SOLN Take 3 mLs by nebulization every 6 (six) hours as needed.    Marland Kitchen lisinopril (PRINIVIL,ZESTRIL) 2.5 MG tablet Take 2.5 mg by mouth daily.    . metoprolol succinate (TOPROL-XL) 25 MG 24 hr tablet Take 25 mg by mouth daily.    Marland Kitchen SPIRIVA HANDIHALER 18 MCG inhalation capsule     . torsemide (DEMADEX) 20 MG tablet     . amiodarone (PACERONE) 200 MG tablet Take by mouth.     No current facility-administered medications for this visit.     PHYSICAL EXAMINATION:   BP (!) 98/58 (BP Location: Right Arm, Patient Position: Sitting)   Pulse 73   Temp (!) 95.8 F (35.4 C) (Tympanic)  Wt 219 lb (99.3 kg)   BMI 28.89 kg/m   Filed Weights   09/04/16 1036  Weight: 219 lb (99.3 kg)    GENERAL: Well-nourished well-developed; Alert, no distress and comfortable. He is alone. Walking himself EYES: No pallor or icterus OROPHARYNX: no thrush or ulceration. NECK: supple, no masses felt LYMPH:  no palpable lymphadenopathy in the cervical, axillary or inguinal regions LUNGS: clear to auscultation and  No wheeze or crackles HEART/CVS: regular rate & rhythm and no murmurs; No lower extremity edema ABDOMEN:abdomen soft, non-tender and normal bowel sounds; positive for splenomegaly- just below the umbilicus/left costal margin.  Musculoskeletal:no cyanosis of digits  and no clubbing  PSYCH: alert & oriented x 3 with fluent speech NEURO: no focal motor/sensory deficits SKIN:  no rashes or significant lesions  LABORATORY DATA:  I have reviewed the data as listed    Component Value Date/Time   NA 133 (L) 09/04/2016 0951   NA 136 07/25/2014 0414   K 4.5 09/04/2016 0951   K 5.6 (H) 07/25/2014 0414   CL 99 (L) 09/04/2016 0951   CL 101 07/25/2014 0414   CO2 27 09/04/2016 0951   CO2 24 07/25/2014 0414   GLUCOSE 106 (H) 09/04/2016 0951   GLUCOSE 143 (H) 07/25/2014 0414   BUN 23 (H) 09/04/2016 0951   BUN 33 (H) 07/25/2014 0414   CREATININE 1.24 09/04/2016 0951   CREATININE 1.34 (H) 01/15/2015 0956   CALCIUM 8.3 (L) 09/04/2016 0951   CALCIUM 8.3 (L) 07/25/2014 0414   PROT 6.1 (L) 09/04/2016 0951   PROT 6.4 07/24/2014 0937   ALBUMIN 4.1 09/04/2016 0951   ALBUMIN 4.0 07/24/2014 0937   AST 26 09/04/2016 0951   AST 31 07/24/2014 0937   ALT 19 09/04/2016 0951   ALT 23 07/24/2014 0937   ALKPHOS 61 09/04/2016 0951   ALKPHOS 53 07/24/2014 0937   BILITOT 0.5 09/04/2016 0951   BILITOT 1.1 (H) 07/24/2014 0937   GFRNONAA 55 (L) 09/04/2016 0951   GFRNONAA 52 (L) 01/15/2015 0956   GFRAA >60 09/04/2016 0951   GFRAA >60 01/15/2015 0956    No results found for: SPEP, UPEP  Lab Results  Component Value Date   WBC 98.9 (HH) 09/04/2016   NEUTROABS 6.8 (H) 09/04/2016   HGB 13.3 09/04/2016   HCT 40.7 09/04/2016   MCV 90.1 09/04/2016   PLT 76 (L) 09/04/2016      Chemistry      Component Value Date/Time   NA 133 (L) 09/04/2016 0951   NA 136 07/25/2014 0414   K 4.5 09/04/2016 0951   K 5.6 (H) 07/25/2014 0414   CL 99 (L) 09/04/2016 0951   CL 101 07/25/2014 0414   CO2 27 09/04/2016 0951   CO2 24 07/25/2014 0414   BUN 23 (H) 09/04/2016 0951   BUN 33 (H) 07/25/2014 0414   CREATININE 1.24 09/04/2016 0951   CREATININE 1.34 (H) 01/15/2015 0956      Component Value Date/Time   CALCIUM 8.3 (L) 09/04/2016 0951   CALCIUM 8.3 (L) 07/25/2014 0414    ALKPHOS 61 09/04/2016 0951   ALKPHOS 53 07/24/2014 0937   AST 26 09/04/2016 0951   AST 31 07/24/2014 0937   ALT 19 09/04/2016 0951   ALT 23 07/24/2014 0937   BILITOT 0.5 09/04/2016 0951   BILITOT 1.1 (H) 07/24/2014 0937         ASSESSMENT & PLAN:   Chronic lymphocytic leukemia ([place]) # 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  March- 2017- PET scan shows massive splenomegaly/ mild mediastinal and RP/periportal adenopathy 1-2 cm in size. On surveillance. Repeat Scan in 3 months.   #  CBC shows white count of  98 /Hemoglobin 13.9/platelets 78 [stable];on eliquis monitor for now. Discussed with the patient that he will need start treatment and he gets symptomatic/or significant changes in this hemoglobin a platelet count.  # Prostate enlargement- PSA check.   # Patient will follow-up with me in approximately 3 months with labs. Scan/labs prior.      Cammie Sickle, MD 09/04/2016 1:31 PM

## 2016-09-04 NOTE — Progress Notes (Signed)
Patient here today for follow up.  Patient has no new concerns today  

## 2016-09-05 LAB — MISC LABCORP TEST (SEND OUT): LABCORP TEST CODE: 113753

## 2016-09-10 ENCOUNTER — Encounter: Payer: Self-pay | Admitting: *Deleted

## 2016-09-10 ENCOUNTER — Ambulatory Visit
Admission: EM | Admit: 2016-09-10 | Discharge: 2016-09-10 | Disposition: A | Discharge status: Home / Self Care | Payer: Medicare Other | Attending: Family Medicine | Admitting: Family Medicine

## 2016-09-10 DIAGNOSIS — L308 Other specified dermatitis: Secondary | ICD-10-CM | POA: Diagnosis not present

## 2016-09-10 MED ORDER — TRIAMCINOLONE ACETONIDE 0.1 % EX OINT: 1.0000 "application " | TOPICAL_OINTMENT | Freq: Two times a day (BID) | CUTANEOUS | 0 refills | Status: DC

## 2016-09-10 NOTE — ED Triage Notes (Signed)
Rash to upper chest and back. Pt c/o itching. Denies pain.

## 2016-09-10 NOTE — ED Provider Notes (Signed)
[facility]    CSN: [id number] Arrival date & time: 09/10/16  1118     History   Chief Complaint Chief Complaint  Patient presents with  . Rash    HPI Clifford Herrera is a 75 y.o. male.   The history is provided by the patient.  Rash  Location:  Torso Torso rash location:  L chest and R chest Quality: itchiness and scaling   Severity:  Mild Onset quality:  Sudden Duration:  1 week Timing:  Constant Progression:  Unchanged Chronicity:  New Context: not animal contact, not chemical exposure, not diapers, not eggs, not exposure to similar rash, not food, not hot tub use, not insect bite/sting, not medications, not new detergent/soap, not nuts, not plant contact, not pollen, not pregnancy, not sick contacts and not sun exposure   Relieved by:  None tried Ineffective treatments:  None tried Associated symptoms: no abdominal pain, no diarrhea, no fatigue, no fever, no headaches, no hoarse voice, no induration, no joint pain, no myalgias, no nausea, no periorbital edema, no shortness of breath, no sore throat, no throat swelling, no tongue swelling, no URI, not vomiting and not wheezing     Past Medical History:  Diagnosis Date  . CLL (chronic lymphocytic leukemia) ([place])   . CPAP (continuous positive airway pressure) dependence   . Heart failure ([place])   . Sleep apnea   . Upper respiratory infection    chronic    Patient Active Problem List   Diagnosis Date Noted  . 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 Prostate enlargement 09/04/2016  . Chronic lymphocytic leukemia ([place]) 02/07/2014    Past Surgical History:  Procedure Laterality Date  . CARDIAC DEFIBRILLATOR PLACEMENT  2015       Home Medications    Prior to Admission medications   Medication Sig Start Date End Date Taking? Authorizing Provider  allopurinol (ZYLOPRIM) 100 MG tablet Take 1 tablet (100 mg total) by mouth daily. 11/07/15  Yes Cammie Sickle, MD  apixaban (ELIQUIS) 5 MG TABS tablet Take 5 mg by mouth 2 (two) times daily.   Yes Historical Provider, MD  fluticasone (FLOVENT HFA) 220 MCG/ACT inhaler Inhale 2 puffs into the lungs 2 (two) times daily.   Yes Historical Provider, MD  furosemide (LASIX) 40 MG tablet Take 40 mg by mouth 2 (two) times daily.   Yes Historical Provider, MD  lisinopril (PRINIVIL,ZESTRIL) 2.5 MG tablet Take 2.5 mg by mouth daily.   Yes Historical Provider, MD  metoprolol succinate (TOPROL-XL) 25 MG 24 hr tablet Take 25 mg by mouth daily.   Yes Historical Provider, MD  SPIRIVA HANDIHALER 18 MCG inhalation capsule  11/27/15  Yes Historical Provider, MD  torsemide (DEMADEX) 20 MG tablet  05/16/16  Yes Historical Provider, MD  albuterol (PROVENTIL HFA;VENTOLIN HFA) 108 (90 Base) MCG/ACT inhaler Inhale 2 puffs into the lungs every 6 (six) hours as needed for wheezing or shortness of breath.    Historical Provider, MD  amiodarone (PACERONE) 200 MG tablet Take by mouth. 05/30/15 05/29/16  Historical Provider, MD  ipratropium-albuterol (DUONEB) 0.5-2.5 (3) MG/3ML SOLN Take 3 mLs by nebulization every 6 (six) hours as needed.    Historical Provider, MD  triamcinolone ointment (KENALOG) 0.1 % Apply 1 application topically 2 (two) times daily. 09/10/16   Norval Gable, MD    Family History Family History  Problem Relation Age of Onset  . Diabetes Mother   . CAD Mother   .  Diabetes Father   . CAD Father     Social History Social History  Substance Use Topics  . Smoking status: Former Research scientist (life sciences)  . Smokeless tobacco: Current User    Types: Chew  . Alcohol use Yes     Comment: 1 beer a month     Allergies   Patient has no known allergies.   Review of Systems Review of Systems  Constitutional: Negative for fatigue and fever.  HENT: Negative for hoarse voice and sore throat.   Respiratory: Negative for shortness of breath and wheezing.   Gastrointestinal: Negative for abdominal pain, diarrhea, nausea and  vomiting.  Musculoskeletal: Negative for arthralgias and myalgias.  Skin: Positive for rash.  Neurological: Negative for headaches.     Physical Exam Triage Vital Signs ED Triage Vitals  Enc Vitals Group     BP 09/10/16 1206 107/64     Pulse Rate 09/10/16 1206 66     Resp 09/10/16 1206 16     Temp 09/10/16 1206 97.8 F (36.6 C)     Temp Source 09/10/16 1206 Oral     SpO2 09/10/16 1206 100 %     Weight 09/10/16 1215 216 lb (98 kg)     Height 09/10/16 1215 6\' 1"  (1.854 m)     Head Circumference --      Peak Flow --      Pain Score --      Pain Loc --      Pain Edu? --      Excl. in [place]? --    No data found.   Updated Vital Signs BP 107/64 (BP Location: Left Arm)   Pulse 66   Temp 97.8 F (36.6 C) (Oral)   Resp 16   Ht 6\' 1"  (1.854 m)   Wt 216 lb (98 kg)   SpO2 100%   BMI 28.50 kg/m   Visual Acuity Right Eye Distance:   Left Eye Distance:   Bilateral Distance:    Right Eye Near:   Left Eye Near:    Bilateral Near:     Physical Exam  Constitutional: He appears well-developed and well-nourished.  Skin: Rash noted. There is erythema.  Scaly, erythematous, papulo macular rash on upper chest and upper arms  Nursing note and vitals reviewed.    UC Treatments / Results  Labs (all labs ordered are listed, but only abnormal results are displayed) Labs Reviewed - No data to display  EKG  EKG Interpretation None       Radiology No results found.  Procedures Procedures (including critical care time)  Medications Ordered in UC Medications - No data to display   Initial Impression / Assessment and Plan / UC Course  I have reviewed the triage vital signs and the nursing notes.  Pertinent labs & imaging results that were available during my care of the patient were reviewed by me and considered in my medical decision making (see chart for details).  Clinical Course       Final Clinical Impressions(s) / UC Diagnoses   Final diagnoses:  Other  eczema    New Prescriptions Discharge Medication List as of 09/10/2016 12:52 PM    START taking these medications   Details  triamcinolone ointment (KENALOG) 0.1 % Apply 1 application topically 2 (two) times daily., Starting Wed 09/10/2016, Normal        1.diagnosis reviewed with patient 2. rx as per orders above; reviewed possible side effects, interactions, risks and benefits  3. Recommend supportive treatment with  increased water intake; skin moisturizers 4. Follow-up prn if symptoms worsen or don't improve   Norval Gable, MD 09/10/16 1310

## 2016-09-30 DIAGNOSIS — R21 Rash and other nonspecific skin eruption: Secondary | ICD-10-CM | POA: Insufficient documentation

## 2016-10-23 DIAGNOSIS — G2581 Restless legs syndrome: Secondary | ICD-10-CM | POA: Insufficient documentation

## 2016-11-26 ENCOUNTER — Telehealth: Payer: Self-pay | Admitting: Internal Medicine

## 2016-11-26 ENCOUNTER — Ambulatory Visit
Admission: RE | Admit: 2016-11-26 | Discharge: 2016-11-26 | Disposition: A | Discharge status: Home / Self Care | Payer: Medicare Other | Source: Ambulatory Visit | Attending: Internal Medicine | Admitting: Internal Medicine

## 2016-11-26 ENCOUNTER — Inpatient Hospital Stay: Payer: Medicare Other | Attending: Internal Medicine

## 2016-11-26 DIAGNOSIS — C8303 Small cell B-cell lymphoma, intra-abdominal lymph nodes: Secondary | ICD-10-CM

## 2016-11-26 DIAGNOSIS — K4090 Unilateral inguinal hernia, without obstruction or gangrene, not specified as recurrent: Secondary | ICD-10-CM | POA: Insufficient documentation

## 2016-11-26 DIAGNOSIS — I700 Atherosclerosis of aorta: Secondary | ICD-10-CM | POA: Diagnosis not present

## 2016-11-26 DIAGNOSIS — N400 Enlarged prostate without lower urinary tract symptoms: Secondary | ICD-10-CM

## 2016-11-26 DIAGNOSIS — R161 Splenomegaly, not elsewhere classified: Secondary | ICD-10-CM | POA: Insufficient documentation

## 2016-11-26 DIAGNOSIS — R590 Localized enlarged lymph nodes: Secondary | ICD-10-CM | POA: Insufficient documentation

## 2016-11-26 DIAGNOSIS — N281 Cyst of kidney, acquired: Secondary | ICD-10-CM | POA: Diagnosis not present

## 2016-11-26 DIAGNOSIS — C9110 Chronic lymphocytic leukemia of B-cell type not having achieved remission: Secondary | ICD-10-CM

## 2016-11-26 LAB — COMPREHENSIVE METABOLIC PANEL
ALK PHOS: 64 U/L (ref 38–126)
ALT: 21 U/L (ref 17–63)
AST: 29 U/L (ref 15–41)
Albumin: 4.4 g/dL (ref 3.5–5.0)
Anion gap: 7 (ref 5–15)
BILIRUBIN TOTAL: 0.7 mg/dL (ref 0.3–1.2)
BUN: 21 mg/dL — AB (ref 6–20)
CALCIUM: 8.5 mg/dL — AB (ref 8.9–10.3)
CHLORIDE: 101 mmol/L (ref 101–111)
CO2: 25 mmol/L (ref 22–32)
CREATININE: 1.29 mg/dL — AB (ref 0.61–1.24)
GFR, EST NON AFRICAN AMERICAN: 53 mL/min — AB (ref >60)
Glucose, Bld: 111 mg/dL — ABNORMAL HIGH (ref 65–99)
Potassium: 4.6 mmol/L (ref 3.5–5.1)
Sodium: 133 mmol/L — ABNORMAL LOW (ref 135–145)
TOTAL PROTEIN: 6.3 g/dL — AB (ref 6.5–8.1)

## 2016-11-26 LAB — CBC WITH DIFFERENTIAL/PLATELET
BASOS PCT: 0 %
Basophils Absolute: 0 10*3/uL (ref 0–0.1)
EOS ABS: 0 10*3/uL (ref 0–0.7)
EOS PCT: 0 %
HEMATOCRIT: 43.9 % (ref 40.0–52.0)
Hemoglobin: 14.5 g/dL (ref 13.0–18.0)
LYMPHS PCT: 91 %
Lymphs Abs: 101.4 10*3/uL — ABNORMAL HIGH (ref 1.0–3.6)
MCH: 29.8 pg (ref 26.0–34.0)
MCHC: 33.1 g/dL (ref 32.0–36.0)
MCV: 89.8 fL (ref 80.0–100.0)
MONO ABS: 2.2 10*3/uL — AB (ref 0.2–1.0)
Monocytes Relative: 2 %
Neutro Abs: 7.8 10*3/uL — ABNORMAL HIGH (ref 1.4–6.5)
Neutrophils Relative %: 7 %
PLATELETS: 73 10*3/uL — AB (ref 150–440)
RBC: 4.88 MIL/uL (ref 4.40–5.90)
RDW: 14.9 % — ABNORMAL HIGH (ref 11.5–14.5)
WBC: 111.4 10*3/uL (ref 3.8–10.6)

## 2016-11-26 LAB — PSA: PSA: 2.13 ng/mL (ref 0.00–4.00)

## 2016-11-26 LAB — GLUCOSE, CAPILLARY: GLUCOSE-CAPILLARY: 111 mg/dL — AB (ref 65–99)

## 2016-11-26 LAB — LACTATE DEHYDROGENASE: LDH: 136 U/L (ref 98–192)

## 2016-11-26 MED ORDER — FLUDEOXYGLUCOSE F - 18 (FDG) INJECTION
12.9700 | Freq: Once | INTRAVENOUS | Status: AC | PRN
  Administered 2016-11-26: 12.97 via INTRAVENOUS

## 2016-11-26 NOTE — Telephone Encounter (Signed)
Please inform pt that his counts are stable; compared to last visit. continie monitoring as planned.   Please follow up on IGVH testing results thru [facility] that was ordered in dec 2017. Thx

## 2016-11-27 NOTE — Telephone Encounter (Signed)
Spoke with [facility]. Results rcvd via fax in Wandra Arthurs, RN on 11/27/16

## 2016-12-03 ENCOUNTER — Other Ambulatory Visit: Payer: Medicare Other

## 2016-12-03 ENCOUNTER — Inpatient Hospital Stay: Payer: Medicare Other | Attending: Internal Medicine | Admitting: Internal Medicine

## 2016-12-03 VITALS — BP 105/62 | HR 72 | Temp 97.6°F | Resp 20 | Ht 73.0 in | Wt 216.1 lb

## 2016-12-03 DIAGNOSIS — R161 Splenomegaly, not elsewhere classified: Secondary | ICD-10-CM

## 2016-12-03 DIAGNOSIS — R590 Localized enlarged lymph nodes: Secondary | ICD-10-CM | POA: Diagnosis not present

## 2016-12-03 DIAGNOSIS — Z7901 Long term (current) use of anticoagulants: Secondary | ICD-10-CM | POA: Diagnosis not present

## 2016-12-03 DIAGNOSIS — I509 Heart failure, unspecified: Secondary | ICD-10-CM | POA: Diagnosis not present

## 2016-12-03 DIAGNOSIS — I2510 Atherosclerotic heart disease of native coronary artery without angina pectoris: Secondary | ICD-10-CM | POA: Diagnosis not present

## 2016-12-03 DIAGNOSIS — I4891 Unspecified atrial fibrillation: Secondary | ICD-10-CM

## 2016-12-03 DIAGNOSIS — C8303 Small cell B-cell lymphoma, intra-abdominal lymph nodes: Secondary | ICD-10-CM

## 2016-12-03 DIAGNOSIS — C9110 Chronic lymphocytic leukemia of B-cell type not having achieved remission: Secondary | ICD-10-CM | POA: Diagnosis present

## 2016-12-03 DIAGNOSIS — Z87891 Personal history of nicotine dependence: Secondary | ICD-10-CM

## 2016-12-03 DIAGNOSIS — G4730 Sleep apnea, unspecified: Secondary | ICD-10-CM | POA: Diagnosis not present

## 2016-12-03 DIAGNOSIS — Z79899 Other long term (current) drug therapy: Secondary | ICD-10-CM

## 2016-12-03 NOTE — Progress Notes (Signed)
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   Patient continues to have easy bruising. Otherwise denies any unusual shortness of breath or cough. Denies any weight loss. Denies any fevers or frequent infections. No night sweats. No lumps or bumps.  REVIEW OF SYSTEMS:  A complete 10 point review of system is done which is negative except mentioned above/history of present illness.   PAST MEDICAL HISTORY :  Past Medical History:  Diagnosis Date  . CLL (chronic lymphocytic leukemia) ([place])   . CPAP (continuous positive airway pressure) dependence   . Heart failure ([place])   . Sleep apnea   . Upper respiratory infection    chronic    PAST SURGICAL HISTORY :   Past Surgical History:  Procedure Laterality Date  . CARDIAC DEFIBRILLATOR PLACEMENT  2015    FAMILY HISTORY :   Family History  Problem Relation Age of Onset  . Diabetes Mother   . CAD Mother   . Diabetes Father   . CAD Father     SOCIAL HISTORY:   Social History  Substance Use Topics  . Smoking status: Former Research scientist (life sciences)  . Smokeless tobacco: Current User    Types: Chew  . Alcohol use Yes     Comment: 1 beer a month    ALLERGIES:  has No Known Allergies.  MEDICATIONS:  Current Outpatient Prescriptions  Medication Sig  Dispense Refill  . allopurinol (ZYLOPRIM) 100 MG tablet Take 1 tablet (100 mg total) by mouth daily. 30 tablet 0  . apixaban (ELIQUIS) 5 MG TABS tablet Take 5 mg by mouth 2 (two) times daily.    . fluticasone (FLOVENT HFA) 220 MCG/ACT inhaler Inhale 2 puffs into the lungs 2 (two) times daily.    Marland Kitchen ipratropium-albuterol (DUONEB) 0.5-2.5 (3) MG/3ML SOLN Take 3 mLs by nebulization every 6 (six) hours as needed.    Marland Kitchen lisinopril (PRINIVIL,ZESTRIL) 5 MG tablet Take 2.5 mg by mouth daily.    . metoprolol succinate (TOPROL-XL) 25 MG 24 hr tablet Take 25 mg by mouth 2 (two) times daily.     Marland Kitchen SPIRIVA HANDIHALER 18 MCG inhalation capsule Place 18 mcg into inhaler and inhale daily.     Marland Kitchen torsemide (DEMADEX) 20 MG tablet Take 20 mg by mouth every morning.     Marland Kitchen albuterol (PROVENTIL HFA;VENTOLIN HFA) 108 (90 Base) MCG/ACT inhaler Inhale 2 puffs into the lungs every 6 (six) hours as needed for wheezing or shortness of breath.    Marland Kitchen amiodarone (PACERONE) 200 MG tablet Take by mouth.     No current facility-administered medications for this visit.     PHYSICAL EXAMINATION:   BP 105/62 (Patient Position: Sitting)   Pulse 72   Temp 97.6 F (36.4 C) (Tympanic)   Resp 20   Ht 6\' 1"  (1.854 m)   Wt 216 lb 1.6 oz (  98 kg)   BMI 28.51 kg/m   Filed Weights   12/03/16 1107  Weight: 216 lb 1.6 oz (98 kg)    GENERAL: Well-nourished well-developed; Alert, no distress and comfortable. He is Accompanied by family.. Walking himself EYES: No pallor or icterus OROPHARYNX: no thrush or ulceration. NECK: supple, no masses felt LYMPH:  no palpable lymphadenopathy in the cervical, axillary or inguinal regions LUNGS: clear to auscultation and  No wheeze or crackles HEART/CVS: regular rate & rhythm and no murmurs; No lower extremity edema ABDOMEN:abdomen soft, non-tender and normal bowel sounds; positive for splenomegaly.  Musculoskeletal:no cyanosis of digits and no clubbing  PSYCH: alert & oriented x 3 with fluent  speech NEURO: no focal motor/sensory deficits SKIN:  no rashes or significant lesions  LABORATORY DATA:  I have reviewed the data as listed    Component Value Date/Time   NA 133 (L) 11/26/2016 0830   NA 136 07/25/2014 0414   K 4.6 11/26/2016 0830   K 5.6 (H) 07/25/2014 0414   CL 101 11/26/2016 0830   CL 101 07/25/2014 0414   CO2 25 11/26/2016 0830   CO2 24 07/25/2014 0414   GLUCOSE 111 (H) 11/26/2016 0830   GLUCOSE 143 (H) 07/25/2014 0414   BUN 21 (H) 11/26/2016 0830   BUN 33 (H) 07/25/2014 0414   CREATININE 1.29 (H) 11/26/2016 0830   CREATININE 1.34 (H) 01/15/2015 0956   CALCIUM 8.5 (L) 11/26/2016 0830   CALCIUM 8.3 (L) 07/25/2014 0414   PROT 6.3 (L) 11/26/2016 0830   PROT 6.4 07/24/2014 0937   ALBUMIN 4.4 11/26/2016 0830   ALBUMIN 4.0 07/24/2014 0937   AST 29 11/26/2016 0830   AST 31 07/24/2014 0937   ALT 21 11/26/2016 0830   ALT 23 07/24/2014 0937   ALKPHOS 64 11/26/2016 0830   ALKPHOS 53 07/24/2014 0937   BILITOT 0.7 11/26/2016 0830   BILITOT 1.1 (H) 07/24/2014 0937   GFRNONAA 53 (L) 11/26/2016 0830   GFRNONAA 52 (L) 01/15/2015 0956   GFRAA >60 11/26/2016 0830   GFRAA >60 01/15/2015 0956    No results found for: SPEP, UPEP  Lab Results  Component Value Date   WBC 111.4 (HH) 11/26/2016   NEUTROABS 7.8 (H) 11/26/2016   HGB 14.5 11/26/2016   HCT 43.9 11/26/2016   MCV 89.8 11/26/2016   PLT 73 (L) 11/26/2016      Chemistry      Component Value Date/Time   NA 133 (L) 11/26/2016 0830   NA 136 07/25/2014 0414   K 4.6 11/26/2016 0830   K 5.6 (H) 07/25/2014 0414   CL 101 11/26/2016 0830   CL 101 07/25/2014 0414   CO2 25 11/26/2016 0830   CO2 24 07/25/2014 0414   BUN 21 (H) 11/26/2016 0830   BUN 33 (H) 07/25/2014 0414   CREATININE 1.29 (H) 11/26/2016 0830   CREATININE 1.34 (H) 01/15/2015 0956      Component Value Date/Time   CALCIUM 8.5 (L) 11/26/2016 0830   CALCIUM 8.3 (L) 07/25/2014 0414   ALKPHOS 64 11/26/2016 0830   ALKPHOS 53 07/24/2014 0937    AST 29 11/26/2016 0830   AST 31 07/24/2014 0937   ALT 21 11/26/2016 0830   ALT 23 07/24/2014 0937   BILITOT 0.7 11/26/2016 0830   BILITOT 1.1 (H) 07/24/2014 0937         ASSESSMENT & PLAN:   Chronic lymphocytic leukemia ([place]) # 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 March 2018- PET scan shows  massive splenomegaly/ mild mediastinal and RP/periportal adenopathy 1-2 cm in size; overall stable/improved imaging.  # Patient continues to be asymptomatic;  CBC shows white count of  113 /Hemoglobin 13.9/platelets 73 [stable;on eliquis monitor for now]. Continue surveillance.  # He will inform us if he notes to have increase in bruising or bleeding. Might consider Gazyva.   # Hx of A.fib- on eliquis.   # follow up in 88months/labs/MD.   # I reviewed the blood work- with the patient in detail; also reviewed the imaging independently [as summarized above]; and with the patient in detail.       Cammie Sickle, MD 12/03/2016 1:27 PM

## 2016-12-03 NOTE — Assessment & Plan Note (Addendum)
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 March 2018- PET scan shows massive splenomegaly/ mild mediastinal and RP/periportal adenopathy 1-2 cm in size; overall stable/improved imaging.  # Patient continues to be asymptomatic;  CBC shows white count of  113 /Hemoglobin 13.9/platelets 73 [stable;on eliquis monitor for now]. Continue surveillance.  # He will inform us if he notes to have increase in bruising or bleeding. Might consider Gazyva.   # Hx of A.fib- on eliquis.   # follow up in 12months/labs/MD.   # I reviewed the blood work- with the patient in detail; also reviewed the imaging independently [as summarized above]; and with the patient in detail.

## 2017-02-10 ENCOUNTER — Other Ambulatory Visit: Payer: Medicare Other

## 2017-02-10 ENCOUNTER — Inpatient Hospital Stay: Payer: Medicare Other | Attending: Internal Medicine | Admitting: Internal Medicine

## 2017-02-10 ENCOUNTER — Inpatient Hospital Stay: Payer: Medicare Other

## 2017-02-10 DIAGNOSIS — C9190 Lymphoid leukemia, unspecified not having achieved remission: Secondary | ICD-10-CM | POA: Diagnosis not present

## 2017-02-10 DIAGNOSIS — Z79899 Other long term (current) drug therapy: Secondary | ICD-10-CM | POA: Diagnosis not present

## 2017-02-10 DIAGNOSIS — R161 Splenomegaly, not elsewhere classified: Secondary | ICD-10-CM | POA: Diagnosis not present

## 2017-02-10 DIAGNOSIS — R590 Localized enlarged lymph nodes: Secondary | ICD-10-CM | POA: Insufficient documentation

## 2017-02-10 DIAGNOSIS — C8303 Small cell B-cell lymphoma, intra-abdominal lymph nodes: Secondary | ICD-10-CM

## 2017-02-10 DIAGNOSIS — G4730 Sleep apnea, unspecified: Secondary | ICD-10-CM | POA: Diagnosis not present

## 2017-02-10 DIAGNOSIS — I4891 Unspecified atrial fibrillation: Secondary | ICD-10-CM | POA: Diagnosis not present

## 2017-02-10 DIAGNOSIS — Z7901 Long term (current) use of anticoagulants: Secondary | ICD-10-CM | POA: Diagnosis not present

## 2017-02-10 DIAGNOSIS — Z87891 Personal history of nicotine dependence: Secondary | ICD-10-CM | POA: Diagnosis not present

## 2017-02-10 DIAGNOSIS — I2510 Atherosclerotic heart disease of native coronary artery without angina pectoris: Secondary | ICD-10-CM | POA: Diagnosis not present

## 2017-02-10 DIAGNOSIS — C9110 Chronic lymphocytic leukemia of B-cell type not having achieved remission: Secondary | ICD-10-CM

## 2017-02-10 LAB — CBC WITH DIFFERENTIAL/PLATELET
BASOS PCT: 1 %
Basophils Absolute: 0.7 10*3/uL — ABNORMAL HIGH (ref 0–0.1)
Eosinophils Absolute: 0.3 10*3/uL (ref 0–0.7)
Eosinophils Relative: 0 %
HEMATOCRIT: 43.6 % (ref 40.0–52.0)
HEMOGLOBIN: 14.5 g/dL (ref 13.0–18.0)
LYMPHS PCT: 89 %
Lymphs Abs: 130.5 10*3/uL — ABNORMAL HIGH (ref 1.0–3.6)
MCH: 29.8 pg (ref 26.0–34.0)
MCHC: 33.3 g/dL (ref 32.0–36.0)
MCV: 89.6 fL (ref 80.0–100.0)
MONO ABS: 1.7 10*3/uL — AB (ref 0.2–1.0)
MONOS PCT: 1 %
NEUTROS ABS: 12.8 10*3/uL — AB (ref 1.4–6.5)
NEUTROS PCT: 9 %
Platelets: 78 10*3/uL — ABNORMAL LOW (ref 150–440)
RBC: 4.87 MIL/uL (ref 4.40–5.90)
RDW: 15 % — ABNORMAL HIGH (ref 11.5–14.5)
WBC: 146 10*3/uL (ref 3.8–10.6)

## 2017-02-10 LAB — COMPREHENSIVE METABOLIC PANEL
ALBUMIN: 4.5 g/dL (ref 3.5–5.0)
ALT: 21 U/L (ref 17–63)
ANION GAP: 5 (ref 5–15)
AST: 32 U/L (ref 15–41)
Alkaline Phosphatase: 72 U/L (ref 38–126)
BILIRUBIN TOTAL: 0.6 mg/dL (ref 0.3–1.2)
BUN: 15 mg/dL (ref 6–20)
CALCIUM: 8.5 mg/dL — AB (ref 8.9–10.3)
CO2: 30 mmol/L (ref 22–32)
Chloride: 99 mmol/L — ABNORMAL LOW (ref 101–111)
Creatinine, Ser: 1.29 mg/dL — ABNORMAL HIGH (ref 0.61–1.24)
GFR calc Af Amer: >60 mL/min (ref >60)
GFR, EST NON AFRICAN AMERICAN: 53 mL/min — AB (ref >60)
GLUCOSE: 105 mg/dL — AB (ref 65–99)
Potassium: 5 mmol/L (ref 3.5–5.1)
Sodium: 134 mmol/L — ABNORMAL LOW (ref 135–145)
TOTAL PROTEIN: 6.4 g/dL — AB (ref 6.5–8.1)

## 2017-02-10 LAB — LACTATE DEHYDROGENASE: LDH: 165 U/L (ref 98–192)

## 2017-02-10 NOTE — Assessment & Plan Note (Addendum)
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 March 2018- PET scan shows massive splenomegaly/ mild mediastinal and RP/periportal adenopathy 1-2 cm in size; overall stable/improved imaging.  # Patient continues to be asymptomatic;  CBC shows white count of  143K /Hemoglobin 13.9/platelets 78 [stable;on eliquis- see discussion below]. We will recheck fish panel at next visit.  # No obvious indications for treatment at this time. Continue surveillance. Had a long discussion with the patient and his wife regarding- the need for treatment likely soon if it continues to bruise easily/platelets started trending down.   # I discussed at length regarding use of Gazyva monthly for 6 cycles.  Discussed the potential side effects of rare infections; infusion reactions. Also discussed regarding use of Ibrutinib- which will be indicated treatments.   # Hx of A.fib- on eliquis. He will talk to his cardiologist regarding easy bruising. [See my plan above]  # follow up in 65months/labs/MD.   CC: Dr.Singh/ Dr.Kowalski

## 2017-02-10 NOTE — Progress Notes (Signed)
[place] OFFICE PROGRESS NOTE  Patient Care Team: Glendon Axe, MD as PCP - General (Internal Medicine)   Oncology History   SUMMARY OF HEMATOLOGIC HISTORY:  # CHRONIC LYMPHOCYTIC LEUKEMIA/SLL- March 2017-CLL FISH- 95% OF NUCLEI POSITIVE FOR 13Q DELETION; March 2017- PET 1-2Cm LN/Splenomegaly. 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  Patient's biggest complaint is easy bruising. Denies any nosebleeds or gum bleeding. Otherwise denies any unusual shortness of breath or cough. Denies any weight loss. Denies any fevers or frequent infections. No night sweats. No lumps or bumps. Denies any blood in stools or black colored stools.  REVIEW OF SYSTEMS:  A complete 10 point review of system is done which is negative except mentioned above/history of present illness.   PAST MEDICAL HISTORY :  Past Medical History:  Diagnosis Date  . CLL (chronic lymphocytic leukemia) ([place])   . CPAP (continuous positive airway pressure) dependence   . Heart failure ([place])   . Sleep apnea   . Upper respiratory infection    chronic    PAST SURGICAL HISTORY :   Past Surgical History:  Procedure Laterality Date  . CARDIAC DEFIBRILLATOR PLACEMENT  2015    FAMILY HISTORY :   Family History  Problem Relation Age of Onset  . Diabetes Mother   . CAD Mother   . Diabetes Father   . CAD Father     SOCIAL HISTORY:   Social History  Substance Use Topics  . Smoking status: Former Research scientist (life sciences)  . Smokeless tobacco: Current User    Types: Chew  . Alcohol use Yes     Comment: 1 beer a month    ALLERGIES:  has No Known  Allergies.  MEDICATIONS:  Current Outpatient Prescriptions  Medication Sig Dispense Refill  . allopurinol (ZYLOPRIM) 100 MG tablet Take 1 tablet (100 mg total) by mouth daily. 30 tablet 0  . amiodarone (PACERONE) 200 MG tablet Take 1 tablet by mouth daily.    Marland Kitchen apixaban (ELIQUIS) 5 MG TABS tablet Take 5 mg by mouth 2 (two) times daily.    . fluticasone (FLOVENT HFA) 220 MCG/ACT inhaler Inhale 2 puffs into the lungs 2 (two) times daily.    Marland Kitchen lisinopril (PRINIVIL,ZESTRIL) 5 MG tablet Take 2.5 mg by mouth daily.    . metoprolol succinate (TOPROL-XL) 25 MG 24 hr tablet Take 25 mg by mouth 2 (two) times daily.     Marland Kitchen rOPINIRole (REQUIP) 0.25 MG tablet Take 1 tablet by mouth at bedtime. Restless legs    . SPIRIVA HANDIHALER 18 MCG inhalation capsule Place 18 mcg into inhaler and inhale daily.     Marland Kitchen torsemide (DEMADEX) 20 MG tablet Take 20 mg by mouth every morning.     Marland Kitchen albuterol (PROVENTIL HFA;VENTOLIN HFA) 108 (90 Base) MCG/ACT inhaler Inhale 2 puffs into the lungs every 6 (six) hours as needed for wheezing or shortness of breath.    Marland Kitchen ipratropium-albuterol (DUONEB) 0.5-2.5 (3) MG/3ML SOLN Take 3 mLs by nebulization every 6 (six) hours as needed.     No current facility-administered medications for this visit.     PHYSICAL EXAMINATION:   BP 100/62 (BP Location: Left Arm, Patient  Position: Sitting)   Pulse 73   Temp 97 F (36.1 C) (Tympanic)   Resp 18   Ht 6\' 1"  (1.854 m)   Wt 218 lb 4.1 oz (99 kg)   BMI 28.80 kg/m   Filed Weights   02/10/17 0946  Weight: 218 lb 4.1 oz (99 kg)    GENERAL: Well-nourished well-developed; Alert, no distress and comfortable. He is Accompanied by family.. Walking himself EYES: No pallor or icterus OROPHARYNX: no thrush or ulceration. NECK: supple, no masses felt LYMPH:  no palpable lymphadenopathy in the cervical, axillary or inguinal regions LUNGS: clear to auscultation and  No wheeze or crackles HEART/CVS: regular rate & rhythm and no murmurs; No  lower extremity edema ABDOMEN:abdomen soft, non-tender and normal bowel sounds; positive for splenomegaly.  Musculoskeletal:no cyanosis of digits and no clubbing  PSYCH: alert & oriented x 3 with fluent speech NEURO: no focal motor/sensory deficits SKIN:  no rashes or significant lesions  LABORATORY DATA:  I have reviewed the data as listed    Component Value Date/Time   NA 134 (L) 02/10/2017 0931   NA 136 07/25/2014 0414   K 5.0 02/10/2017 0931   K 5.6 (H) 07/25/2014 0414   CL 99 (L) 02/10/2017 0931   CL 101 07/25/2014 0414   CO2 30 02/10/2017 0931   CO2 24 07/25/2014 0414   GLUCOSE 105 (H) 02/10/2017 0931   GLUCOSE 143 (H) 07/25/2014 0414   BUN 15 02/10/2017 0931   BUN 33 (H) 07/25/2014 0414   CREATININE 1.29 (H) 02/10/2017 0931   CREATININE 1.34 (H) 01/15/2015 0956   CALCIUM 8.5 (L) 02/10/2017 0931   CALCIUM 8.3 (L) 07/25/2014 0414   PROT 6.4 (L) 02/10/2017 0931   PROT 6.4 07/24/2014 0937   ALBUMIN 4.5 02/10/2017 0931   ALBUMIN 4.0 07/24/2014 0937   AST 32 02/10/2017 0931   AST 31 07/24/2014 0937   ALT 21 02/10/2017 0931   ALT 23 07/24/2014 0937   ALKPHOS 72 02/10/2017 0931   ALKPHOS 53 07/24/2014 0937   BILITOT 0.6 02/10/2017 0931   BILITOT 1.1 (H) 07/24/2014 0937   GFRNONAA 53 (L) 02/10/2017 0931   GFRNONAA 52 (L) 01/15/2015 0956   GFRAA >60 02/10/2017 0931   GFRAA >60 01/15/2015 0956    No results found for: SPEP, UPEP  Lab Results  Component Value Date   WBC 146.0 (HH) 02/10/2017   NEUTROABS 12.8 (H) 02/10/2017   HGB 14.5 02/10/2017   HCT 43.6 02/10/2017   MCV 89.6 02/10/2017   PLT 78 (L) 02/10/2017      Chemistry      Component Value Date/Time   NA 134 (L) 02/10/2017 0931   NA 136 07/25/2014 0414   K 5.0 02/10/2017 0931   K 5.6 (H) 07/25/2014 0414   CL 99 (L) 02/10/2017 0931   CL 101 07/25/2014 0414   CO2 30 02/10/2017 0931   CO2 24 07/25/2014 0414   BUN 15 02/10/2017 0931   BUN 33 (H) 07/25/2014 0414   CREATININE 1.29 (H) 02/10/2017 0931    CREATININE 1.34 (H) 01/15/2015 0956      Component Value Date/Time   CALCIUM 8.5 (L) 02/10/2017 0931   CALCIUM 8.3 (L) 07/25/2014 0414   ALKPHOS 72 02/10/2017 0931   ALKPHOS 53 07/24/2014 0937   AST 32 02/10/2017 0931   AST 31 07/24/2014 0937   ALT 21 02/10/2017 0931   ALT 23 07/24/2014 0937   BILITOT 0.6 02/10/2017 0931   BILITOT 1.1 (H) 07/24/2014 2671  ASSESSMENT & PLAN:   Chronic lymphocytic leukemia ([place]) # 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 March 2018- PET scan shows massive splenomegaly/ mild mediastinal and RP/periportal adenopathy 1-2 cm in size; overall stable/improved imaging.  # Patient continues to be asymptomatic;  CBC shows white count of  143K /Hemoglobin 13.9/platelets 78 [stable;on eliquis- see discussion below]. We will recheck fish panel at next visit.  # No obvious indications for treatment at this time. Continue surveillance. Had a long discussion with the patient and his wife regarding- the need for treatment likely soon if it continues to bruise easily/platelets started trending down.   # I discussed at length regarding use of Gazyva monthly for 6 cycles.  Discussed the potential side effects of rare infections; infusion reactions. Also discussed regarding use of Ibrutinib- which will be indicated treatments.   # Hx of A.fib- on eliquis. He will talk to his cardiologist regarding easy bruising. [See my plan above]  # follow up in 7months/labs/MD.   CC: Dr.Singh/ Dr.Kowalski     Cammie Sickle, MD 02/10/2017 2:47 PM

## 2017-02-10 NOTE — Progress Notes (Signed)
Patient here for follow-up for CLL. Pt c/o generalized bruising.

## 2017-02-24 ENCOUNTER — Ambulatory Visit: Payer: Medicare Other | Admitting: Internal Medicine

## 2017-02-24 ENCOUNTER — Other Ambulatory Visit: Payer: Medicare Other

## 2017-03-05 ENCOUNTER — Other Ambulatory Visit: Payer: Medicare Other

## 2017-03-05 ENCOUNTER — Ambulatory Visit: Payer: Medicare Other | Admitting: Internal Medicine

## 2017-05-19 ENCOUNTER — Inpatient Hospital Stay: Payer: Medicare Other | Attending: Internal Medicine | Admitting: Internal Medicine

## 2017-05-19 ENCOUNTER — Inpatient Hospital Stay: Payer: Medicare Other

## 2017-05-19 VITALS — BP 95/59 | HR 70 | Temp 97.4°F | Resp 16 | Wt 214.5 lb

## 2017-05-19 DIAGNOSIS — Z79899 Other long term (current) drug therapy: Secondary | ICD-10-CM | POA: Diagnosis not present

## 2017-05-19 DIAGNOSIS — I951 Orthostatic hypotension: Secondary | ICD-10-CM

## 2017-05-19 DIAGNOSIS — C9110 Chronic lymphocytic leukemia of B-cell type not having achieved remission: Secondary | ICD-10-CM

## 2017-05-19 DIAGNOSIS — E875 Hyperkalemia: Secondary | ICD-10-CM | POA: Insufficient documentation

## 2017-05-19 DIAGNOSIS — J398 Other specified diseases of upper respiratory tract: Secondary | ICD-10-CM | POA: Insufficient documentation

## 2017-05-19 DIAGNOSIS — Z7901 Long term (current) use of anticoagulants: Secondary | ICD-10-CM | POA: Insufficient documentation

## 2017-05-19 DIAGNOSIS — C9190 Lymphoid leukemia, unspecified not having achieved remission: Secondary | ICD-10-CM | POA: Diagnosis not present

## 2017-05-19 DIAGNOSIS — I509 Heart failure, unspecified: Secondary | ICD-10-CM | POA: Diagnosis not present

## 2017-05-19 DIAGNOSIS — G4730 Sleep apnea, unspecified: Secondary | ICD-10-CM | POA: Diagnosis not present

## 2017-05-19 DIAGNOSIS — Z87891 Personal history of nicotine dependence: Secondary | ICD-10-CM | POA: Insufficient documentation

## 2017-05-19 DIAGNOSIS — I4891 Unspecified atrial fibrillation: Secondary | ICD-10-CM | POA: Insufficient documentation

## 2017-05-19 LAB — CBC WITH DIFFERENTIAL/PLATELET
Basophils Absolute: 0 10*3/uL (ref 0–0.1)
Basophils Relative: 0 %
Eosinophils Absolute: 0 10*3/uL (ref 0–0.7)
Eosinophils Relative: 0 %
HEMATOCRIT: 43 % (ref 40.0–52.0)
Hemoglobin: 14.1 g/dL (ref 13.0–18.0)
LYMPHS ABS: 141.9 10*3/uL — AB (ref 1.0–3.6)
LYMPHS PCT: 93 %
MCH: 29.9 pg (ref 26.0–34.0)
MCHC: 32.8 g/dL (ref 32.0–36.0)
MCV: 91.1 fL (ref 80.0–100.0)
MONOS PCT: 2 %
Monocytes Absolute: 3.1 10*3/uL — ABNORMAL HIGH (ref 0.2–1.0)
NEUTROS ABS: 7.6 10*3/uL — AB (ref 1.4–6.5)
Neutrophils Relative %: 5 %
PLATELETS: 77 10*3/uL — AB (ref 150–440)
RBC: 4.72 MIL/uL (ref 4.40–5.90)
RDW: 14.3 % (ref 11.5–14.5)
WBC: 152.6 10*3/uL (ref 3.8–10.6)

## 2017-05-19 LAB — COMPREHENSIVE METABOLIC PANEL
ALT: 18 U/L (ref 17–63)
AST: 27 U/L (ref 15–41)
Albumin: 4.2 g/dL (ref 3.5–5.0)
Alkaline Phosphatase: 64 U/L (ref 38–126)
Anion gap: 7 (ref 5–15)
BILIRUBIN TOTAL: 1 mg/dL (ref 0.3–1.2)
BUN: 16 mg/dL (ref 6–20)
CALCIUM: 8.6 mg/dL — AB (ref 8.9–10.3)
CHLORIDE: 101 mmol/L (ref 101–111)
CO2: 27 mmol/L (ref 22–32)
CREATININE: 1.26 mg/dL — AB (ref 0.61–1.24)
GFR calc non Af Amer: 54 mL/min — ABNORMAL LOW (ref >60)
Glucose, Bld: 94 mg/dL (ref 65–99)
Potassium: 5.7 mmol/L — ABNORMAL HIGH (ref 3.5–5.1)
Sodium: 135 mmol/L (ref 135–145)
TOTAL PROTEIN: 6.1 g/dL — AB (ref 6.5–8.1)

## 2017-05-19 LAB — LACTATE DEHYDROGENASE: LDH: 148 U/L (ref 98–192)

## 2017-05-19 NOTE — Progress Notes (Signed)
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 NEGATIVE for CCND1/IGH, ATM, chromosome 12, and TP53  were normal.   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  Patient continues to have easy bruising. But no spontaneous bleeding. Denies any nosebleeds or gum bleeding. Otherwise denies any unusual shortness of breath or cough. Denies any weight loss. Denies any fevers or frequent infections. No night sweats. No lumps or bumps. Denies any blood in stools or black colored stools. Denies any left upper quadrant pain.   REVIEW OF SYSTEMS:  A complete 10 point review of system is done which is negative except mentioned above/history of present illness.   PAST MEDICAL HISTORY :  Past Medical History:  Diagnosis Date  . CLL (chronic lymphocytic leukemia) ([place])   . CPAP (continuous positive airway pressure) dependence   . Heart failure ([place])   . Sleep apnea   . Upper respiratory infection    chronic    PAST SURGICAL HISTORY :   Past Surgical History:  Procedure Laterality Date  . CARDIAC DEFIBRILLATOR PLACEMENT  2015    FAMILY HISTORY :   Family History  Problem Relation Age of Onset  . Diabetes Mother   . CAD Mother   . Diabetes Father   . CAD Father     SOCIAL HISTORY:   Social History   Substance Use Topics  . Smoking status: Former Research scientist (life sciences)  . Smokeless tobacco: Current User    Types: Chew  . Alcohol use Yes     Comment: 1 beer a month    ALLERGIES:  has No Known Allergies.  MEDICATIONS:  Current Outpatient Prescriptions  Medication Sig Dispense Refill  . albuterol (PROVENTIL HFA;VENTOLIN HFA) 108 (90 Base) MCG/ACT inhaler Inhale 2 puffs into the lungs every 6 (six) hours as needed for wheezing or shortness of breath.    . allopurinol (ZYLOPRIM) 100 MG tablet Take 1 tablet (100 mg total) by mouth daily. 30 tablet 0  . amiodarone (PACERONE) 200 MG tablet Take 1 tablet by mouth daily.    Marland Kitchen apixaban (ELIQUIS) 5 MG TABS tablet Take 5 mg by mouth 2 (two) times daily.    . fluticasone (FLOVENT HFA) 220 MCG/ACT inhaler Inhale 2 puffs into the lungs 2 (two) times daily.    Marland Kitchen ipratropium-albuterol (DUONEB) 0.5-2.5 (3) MG/3ML SOLN Take 3 mLs by nebulization every 6 (six) hours as needed.    Marland Kitchen lisinopril (PRINIVIL,ZESTRIL) 5 MG tablet Take 2.5 mg by mouth daily.    . metoprolol succinate (TOPROL-XL) 25 MG 24 hr tablet Take 25 mg by mouth 2 (two) times daily.     Marland Kitchen rOPINIRole (REQUIP) 0.25 MG tablet Take 1 tablet by mouth at bedtime. Restless legs    . SPIRIVA HANDIHALER 18 MCG inhalation capsule Place 18 mcg into inhaler and inhale daily.     Marland Kitchen  torsemide (DEMADEX) 20 MG tablet Take 10 mg by mouth every morning.      No current facility-administered medications for this visit.     PHYSICAL EXAMINATION:   BP (!) 95/59 (BP Location: Left Arm, Patient Position: Sitting)   Pulse 70   Temp (!) 97.4 F (36.3 C)   Resp 16   Wt 214 lb 8.1 oz (97.3 kg)   BMI 28.30 kg/m   Filed Weights   05/19/17 1016  Weight: 214 lb 8.1 oz (97.3 kg)    GENERAL: Well-nourished well-developed; Alert, no distress and comfortable. He is Accompanied by family.. Walking himself EYES: No pallor or icterus OROPHARYNX: no thrush or ulceration. NECK: supple, no masses felt LYMPH:  no palpable  lymphadenopathy in the cervical, axillary or inguinal regions LUNGS: clear to auscultation and  No wheeze or crackles HEART/CVS: regular rate & rhythm and no murmurs; No lower extremity edema ABDOMEN:abdomen soft, non-tender and normal bowel sounds; positive for splenomegaly.  Musculoskeletal:no cyanosis of digits and no clubbing  PSYCH: alert & oriented x 3 with fluent speech NEURO: no focal motor/sensory deficits SKIN:  no rashes or significant lesions  LABORATORY DATA:  I have reviewed the data as listed    Component Value Date/Time   NA 135 05/19/2017 0959   NA 136 07/25/2014 0414   K 5.0 05/22/2017 0907   K 5.6 (H) 07/25/2014 0414   CL 101 05/19/2017 0959   CL 101 07/25/2014 0414   CO2 27 05/19/2017 0959   CO2 24 07/25/2014 0414   GLUCOSE 94 05/19/2017 0959   GLUCOSE 143 (H) 07/25/2014 0414   BUN 16 05/19/2017 0959   BUN 33 (H) 07/25/2014 0414   CREATININE 1.26 (H) 05/19/2017 0959   CREATININE 1.34 (H) 01/15/2015 0956   CALCIUM 8.6 (L) 05/19/2017 0959   CALCIUM 8.3 (L) 07/25/2014 0414   PROT 6.1 (L) 05/19/2017 0959   PROT 6.4 07/24/2014 0937   ALBUMIN 4.2 05/19/2017 0959   ALBUMIN 4.0 07/24/2014 0937   AST 27 05/19/2017 0959   AST 31 07/24/2014 0937   ALT 18 05/19/2017 0959   ALT 23 07/24/2014 0937   ALKPHOS 64 05/19/2017 0959   ALKPHOS 53 07/24/2014 0937   BILITOT 1.0 05/19/2017 0959   BILITOT 1.1 (H) 07/24/2014 0937   GFRNONAA 54 (L) 05/19/2017 0959   GFRNONAA 52 (L) 01/15/2015 0956   GFRAA >60 05/19/2017 0959   GFRAA >60 01/15/2015 0956    No results found for: SPEP, UPEP  Lab Results  Component Value Date   WBC 152.6 (HH) 05/19/2017   NEUTROABS 7.6 (H) 05/19/2017   HGB 14.1 05/19/2017   HCT 43.0 05/19/2017   MCV 91.1 05/19/2017   PLT 77 (L) 05/19/2017      Chemistry      Component Value Date/Time   NA 135 05/19/2017 0959   NA 136 07/25/2014 0414   K 5.0 05/22/2017 0907   K 5.6 (H) 07/25/2014 0414   CL 101 05/19/2017 0959   CL 101 07/25/2014  0414   CO2 27 05/19/2017 0959   CO2 24 07/25/2014 0414   BUN 16 05/19/2017 0959   BUN 33 (H) 07/25/2014 0414   CREATININE 1.26 (H) 05/19/2017 0959   CREATININE 1.34 (H) 01/15/2015 0956      Component Value Date/Time   CALCIUM 8.6 (L) 05/19/2017 0959   CALCIUM 8.3 (L) 07/25/2014 0414   ALKPHOS 64 05/19/2017 0959   ALKPHOS 53 07/24/2014 0937   AST 27 05/19/2017 0959   AST 31  07/24/2014 0937   ALT 18 05/19/2017 0959   ALT 23 07/24/2014 0937   BILITOT 1.0 05/19/2017 0959   BILITOT 1.1 (H) 07/24/2014 0937         ASSESSMENT & PLAN:   Chronic lymphocytic leukemia (H[facility]) # 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 March 2018- PET scan shows massive splenomegaly/ mild mediastinal and RP/periportal adenopathy 1-2 cm in size; overall stable/improved imaging.  # Patient continues to be asymptomatic;  CBC shows white count of  153K /Hemoglobin 13.9/platelets 77 [stable;on eliquis- see discussion below]. Pending- FISH panel.   # No obvious indications for treatment at this time. Continue surveillance. Had a long discussion with the patient and his wife regarding- the need for treatment likely soon if it continues to bruise easily/platelets started trending down.   # Hyperkalemia- K- 5.7; repeat K on 24th; if still high recommend HOLDING lisinopril. If still elevated- recommend Kayexalte.  # I discussed at length regarding use of Gazyva monthly for 6 cycles.  Discussed the potential side effects of rare infections; infusion reactions. Also discussed regarding use of Ibrutinib- which will be indicated treatments.   # Hx of A.fib- on eliquis. Stable.  # follow up in 55month/labs/MD.   CC: Dr.Singh/ Dr.Kowalski.      GCammie Sickle MD 05/27/2017 8:34 AM

## 2017-05-19 NOTE — Assessment & Plan Note (Addendum)
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 March 2018- PET scan shows massive splenomegaly/ mild mediastinal and RP/periportal adenopathy 1-2 cm in size; overall stable/improved imaging.  # Patient continues to be asymptomatic;  CBC shows white count of  153K /Hemoglobin 13.9/platelets 77 [stable;on eliquis- see discussion below]. Pending- FISH panel.   # No obvious indications for treatment at this time. Continue surveillance. Had a long discussion with the patient and his wife regarding- the need for treatment likely soon if it continues to bruise easily/platelets started trending down.   # Hyperkalemia- K- 5.7; repeat K on 24th; if still high recommend HOLDING lisinopril. If still elevated- recommend Kayexalte.  # I discussed at length regarding use of Gazyva monthly for 6 cycles.  Discussed the potential side effects of rare infections; infusion reactions. Also discussed regarding use of Ibrutinib- which will be indicated treatments.   # Hx of A.fib- on eliquis. Stable.  # follow up in 3months/labs/MD.   CC: Dr.Singh/ Dr.Kowalski.  

## 2017-05-22 ENCOUNTER — Telehealth: Payer: Self-pay | Admitting: *Deleted

## 2017-05-22 ENCOUNTER — Inpatient Hospital Stay: Payer: Medicare Other

## 2017-05-22 ENCOUNTER — Other Ambulatory Visit: Payer: Self-pay | Admitting: *Deleted

## 2017-05-22 DIAGNOSIS — E875 Hyperkalemia: Secondary | ICD-10-CM

## 2017-05-22 DIAGNOSIS — C9190 Lymphoid leukemia, unspecified not having achieved remission: Secondary | ICD-10-CM | POA: Diagnosis not present

## 2017-05-22 DIAGNOSIS — C8303 Small cell B-cell lymphoma, intra-abdominal lymph nodes: Secondary | ICD-10-CM

## 2017-05-22 LAB — POTASSIUM: Potassium: 5 mmol/L (ref 3.5–5.1)

## 2017-05-22 NOTE — Telephone Encounter (Signed)
Contacted patient. Pt informed that his potassium level is now normal. Patient states that he appreciated the phone call for his results.

## 2017-05-25 LAB — FISH HES LEUKEMIA, 4Q12 REA: PDF: 0

## 2017-08-08 ENCOUNTER — Emergency Department
Admission: EM | Admit: 2017-08-08 | Discharge: 2017-08-08 | Disposition: A | Discharge status: Home / Self Care | Payer: Medicare Other | Attending: Emergency Medicine | Admitting: Emergency Medicine

## 2017-08-08 ENCOUNTER — Emergency Department: Payer: Medicare Other

## 2017-08-08 DIAGNOSIS — Z87891 Personal history of nicotine dependence: Secondary | ICD-10-CM | POA: Diagnosis not present

## 2017-08-08 DIAGNOSIS — Y999 Unspecified external cause status: Secondary | ICD-10-CM | POA: Insufficient documentation

## 2017-08-08 DIAGNOSIS — C9110 Chronic lymphocytic leukemia of B-cell type not having achieved remission: Secondary | ICD-10-CM | POA: Diagnosis not present

## 2017-08-08 DIAGNOSIS — S46911A Strain of unspecified muscle, fascia and tendon at shoulder and upper arm level, right arm, initial encounter: Secondary | ICD-10-CM | POA: Insufficient documentation

## 2017-08-08 DIAGNOSIS — S4991XA Unspecified injury of right shoulder and upper arm, initial encounter: Secondary | ICD-10-CM | POA: Diagnosis present

## 2017-08-08 DIAGNOSIS — Y9389 Activity, other specified: Secondary | ICD-10-CM | POA: Insufficient documentation

## 2017-08-08 DIAGNOSIS — Y92007 Garden or yard of unspecified non-institutional (private) residence as the place of occurrence of the external cause: Secondary | ICD-10-CM | POA: Diagnosis not present

## 2017-08-08 DIAGNOSIS — S2231XA Fracture of one rib, right side, initial encounter for closed fracture: Secondary | ICD-10-CM | POA: Diagnosis not present

## 2017-08-08 DIAGNOSIS — Z79899 Other long term (current) drug therapy: Secondary | ICD-10-CM | POA: Insufficient documentation

## 2017-08-08 DIAGNOSIS — Z7901 Long term (current) use of anticoagulants: Secondary | ICD-10-CM | POA: Diagnosis not present

## 2017-08-08 DIAGNOSIS — Z95810 Presence of automatic (implantable) cardiac defibrillator: Secondary | ICD-10-CM | POA: Insufficient documentation

## 2017-08-08 DIAGNOSIS — W010XXA Fall on same level from slipping, tripping and stumbling without subsequent striking against object, initial encounter: Secondary | ICD-10-CM | POA: Diagnosis not present

## 2017-08-08 HISTORY — DX: Unspecified hearing loss, unspecified ear: H91.90

## 2017-08-08 HISTORY — DX: Presence of automatic (implantable) cardiac defibrillator: Z95.810

## 2017-08-08 MED ORDER — OXYCODONE HCL 5 MG PO TABS: 5.0000 mg | ORAL_TABLET | Freq: Four times a day (QID) | ORAL | 0 refills | Status: DC | PRN

## 2017-08-08 MED ORDER — OXYCODONE HCL 5 MG PO TABS
5.0000 mg | ORAL_TABLET | Freq: Once | ORAL | Status: AC
  Administered 2017-08-08: 5 mg via ORAL
  Filled 2017-08-08: qty 1

## 2017-08-08 NOTE — ED Provider Notes (Signed)
[facility] Emergency Department Provider Note ____________________________________________  Time seen: Approximately 7:20 AM  I have reviewed the triage vital signs and the nursing notes.   HISTORY  Chief Complaint Fall    HPI Clifford Herrera is a 76 y.o. male who presents to the emergency department for evaluation and treatment after sustaining a mechanical, non-syncopal fall this morning. He states that he was chasing a kitten in the yard and tripped. He landed on his right arm and side. He now has pain in the right shoulder and pain in the right lateral chest wall that worsens with attempts at deep inspiration and with movement. He has not taken anything to help alleviate his pain.  Past Medical History:  Diagnosis Date  . CLL (chronic lymphocytic leukemia) ([place])   . CPAP (continuous positive airway pressure) dependence   . Heart failure ([place])   . HOH (hard of hearing)   . Presence of automatic (implantable) cardiac defibrillator   . Sleep apnea   . Upper respiratory infection    chronic    Patient Active Problem List   Diagnosis Date Noted  . 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 Prostate enlargement 09/04/2016  . Chronic lymphocytic leukemia ([place]) 02/07/2014    Past Surgical History:  Procedure Laterality Date  . CARDIAC DEFIBRILLATOR PLACEMENT  2015    Prior to Admission medications   Medication Sig Start Date End Date Taking? Authorizing Provider  albuterol (PROVENTIL HFA;VENTOLIN HFA) 108 (90 Base) MCG/ACT inhaler Inhale 2 puffs into the lungs every 6 (six) hours as needed for wheezing or shortness of breath.    [provider]  allopurinol (ZYLOPRIM) 100 MG tablet Take 1 tablet (100 mg total) by mouth daily. 11/07/15   Cammie Sickle, MD  amiodarone (PACERONE) 200 MG tablet Take 1 tablet by mouth daily. 12/30/16   [provider]  apixaban (ELIQUIS) 5 MG TABS tablet Take 5 mg  by mouth 2 (two) times daily.    [provider]  fluticasone (FLOVENT HFA) 220 MCG/ACT inhaler Inhale 2 puffs into the lungs 2 (two) times daily.    [provider]  ipratropium-albuterol (DUONEB) 0.5-2.5 (3) MG/3ML SOLN Take 3 mLs by nebulization every 6 (six) hours as needed.    [provider]  lisinopril (PRINIVIL,ZESTRIL) 5 MG tablet Take 2.5 mg by mouth daily.    [provider]  metoprolol succinate (TOPROL-XL) 25 MG 24 hr tablet Take 25 mg by mouth 2 (two) times daily.     [provider]  oxyCODONE (ROXICODONE) 5 MG immediate release tablet Take 1 tablet (5 mg total) every 6 (six) hours as needed by mouth for severe pain. 08/08/17 08/08/18  Jerimiah Wolman, Johnette Abraham B, FNP  rOPINIRole (REQUIP) 0.25 MG tablet Take 1 tablet by mouth at bedtime. Restless legs 02/08/17   [provider]  SPIRIVA HANDIHALER 18 MCG inhalation capsule Place 18 mcg into inhaler and inhale daily.  11/27/15   [provider]  torsemide (DEMADEX) 20 MG tablet Take 10 mg by mouth every morning.  05/16/16   [provider]    Allergies Patient has no known allergies.  Family History  Problem Relation Age of Onset  . Diabetes Mother   . CAD Mother   . Diabetes Father   . CAD Father     Social History Social History   Tobacco Use  . Smoking status: Former Research scientist (life sciences)  . Smokeless tobacco: Current User    Types: Chew  Substance Use Topics  . Alcohol use: Yes    Comment: 1 beer a month  . Drug use: No    Review of Systems Constitutional: Uncomfortable appearing Cardiovascular: Negative for chest pain Respiratory: Positive for pain with attempt at deep inspiration. Musculoskeletal: Positive for pain in the right shoulder and right lateral thorax Skin: Negative for wounds associated with fall this morning.  Neurological: Negative for loss of consciousness. Negative for change in  cognition.  ____________________________________________   PHYSICAL EXAM:  VITAL SIGNS: ED Triage Vitals  Enc Vitals Group     BP 08/08/17 0646 (!) 99/58     Pulse Rate 08/08/17 0646 68     Resp 08/08/17 0646 (!) 22     Temp 08/08/17 0646 97.6 F (36.4 C)     Temp Source 08/08/17 0646 Oral     SpO2 08/08/17 0646 97 %     Weight 08/08/17 0645 215 lb (97.5 kg)     Height --      Head Circumference --      Peak Flow --      Pain Score 08/08/17 0643 7     Pain Loc --      Pain Edu? --      Excl. in [place]? --     Constitutional: Alert and oriented. Well appearing and in no acute distress. Eyes: Conjunctivae are clear without discharge or drainage.  Head: Atraumatic Neck: Nexus criteria is negative Respiratory: Breath sounds clear to auscultation throughout. Musculoskeletal: Limited range of motion of the right shoulder secondary to pain. No obvious deformity or step-off. Right lateral thorax tender to palpation over the lower ribs. Neurologic: Awake, alert, oriented 4.  Skin: Scattered ecchymosis and small skin tears over the upper arms  Psychiatric: Affect and behavior are appropriate.  ____________________________________________   LABS (all labs ordered are listed, but only abnormal results are displayed)  Labs Reviewed - No data to display ____________________________________________  RADIOLOGY  Right shoulder images negative for acute bony abnormality per radiology. Chest x-ray reveals a fracture of the posterior lateral right ninth rib that is mildly comminuted and without significant displacement. ____________________________________________   PROCEDURES  Procedure(s) performed: None  ____________________________________________   INITIAL IMPRESSION / ASSESSMENT AND PLAN / ED COURSE  Clifford Herrera is a 76 y.o. male who presents to the emergency department for evaluation and treatment after sustaining a mechanical, non-syncopal fall at his home this  morning. Images reveal a fracture of the right ninth rib. He was given a Roxicet IR 5 mg tablet while in the emergency department and will be given a prescription for the same. Home care instructions for pneumonia prevention were discussed with him and his wife. They are both aware that he will need to schedule a follow-up appointment with his primary care provider if pain is not improving with medication. They were instructed to follow up sooner for any symptoms of concern or return to the emergency department if they're unable schedule an appointment.  Pertinent labs & imaging results that were available during my care of the patient were reviewed by me and considered in my medical decision making (see chart for details).  _________________________________________   FINAL CLINICAL IMPRESSION(S) / ED DIAGNOSES  Final diagnoses:  Closed fracture of one rib of right side, initial encounter  Shoulder strain, right, initial encounter     If controlled substance prescribed during this visit, 12 month history viewed on the [place] prior to issuing an initial prescription for Schedule II or III opiod.  Victorino Dike, FNP 08/08/17 0177    Nena Polio, MD 08/08/17 [phone]

## 2017-08-08 NOTE — ED Triage Notes (Signed)
Patient c/o right shoulder pain, right rib cage pain and dyspnea at rest after mechanical fall. Patient was trying to save catch a kitten in the yard and tripped.

## 2017-08-08 NOTE — ED Notes (Signed)
Patient to ed with c/o right shoulder pain, right rib pain and dyspnea after mechanical fall this am. Patient states he tipped and fell onto ground.  Denies head trauma, denies loss of consciousness.  Pt reports increased pain with movement and deep breath.

## 2017-08-08 NOTE — Discharge Instructions (Signed)
Follow up with your primary care provider for symptoms that are not improving over the next couple of weeks or sooner for new concerns.  Return to the ER for symptoms that change or worsen if you are unable to schedule an appointment.

## 2017-08-17 ENCOUNTER — Encounter: Payer: Self-pay | Admitting: Family Medicine

## 2017-08-17 ENCOUNTER — Ambulatory Visit (INDEPENDENT_AMBULATORY_CARE_PROVIDER_SITE_OTHER): Payer: Medicare Other | Admitting: Family Medicine

## 2017-08-17 VITALS — BP 98/68 | HR 78 | Resp 16 | Ht 73.0 in | Wt 212.6 lb

## 2017-08-17 DIAGNOSIS — H6120 Impacted cerumen, unspecified ear: Secondary | ICD-10-CM | POA: Insufficient documentation

## 2017-08-17 DIAGNOSIS — R0981 Nasal congestion: Secondary | ICD-10-CM | POA: Insufficient documentation

## 2017-08-17 DIAGNOSIS — M1A9XX0 Chronic gout, unspecified, without tophus (tophi): Secondary | ICD-10-CM | POA: Diagnosis not present

## 2017-08-17 DIAGNOSIS — I429 Cardiomyopathy, unspecified: Secondary | ICD-10-CM

## 2017-08-17 DIAGNOSIS — Z9989 Dependence on other enabling machines and devices: Secondary | ICD-10-CM

## 2017-08-17 DIAGNOSIS — R059 Cough, unspecified: Secondary | ICD-10-CM | POA: Insufficient documentation

## 2017-08-17 DIAGNOSIS — N183 Chronic kidney disease, stage 3 unspecified: Secondary | ICD-10-CM

## 2017-08-17 DIAGNOSIS — M109 Gout, unspecified: Secondary | ICD-10-CM | POA: Insufficient documentation

## 2017-08-17 DIAGNOSIS — J069 Acute upper respiratory infection, unspecified: Secondary | ICD-10-CM | POA: Insufficient documentation

## 2017-08-17 DIAGNOSIS — J309 Allergic rhinitis, unspecified: Secondary | ICD-10-CM | POA: Insufficient documentation

## 2017-08-17 DIAGNOSIS — R5383 Other fatigue: Secondary | ICD-10-CM

## 2017-08-17 DIAGNOSIS — I509 Heart failure, unspecified: Secondary | ICD-10-CM

## 2017-08-17 DIAGNOSIS — C9190 Lymphoid leukemia, unspecified not having achieved remission: Secondary | ICD-10-CM

## 2017-08-17 DIAGNOSIS — Z23 Encounter for immunization: Secondary | ICD-10-CM

## 2017-08-17 DIAGNOSIS — N401 Enlarged prostate with lower urinary tract symptoms: Secondary | ICD-10-CM

## 2017-08-17 DIAGNOSIS — S76211A Strain of adductor muscle, fascia and tendon of right thigh, initial encounter: Secondary | ICD-10-CM | POA: Diagnosis not present

## 2017-08-17 DIAGNOSIS — J449 Chronic obstructive pulmonary disease, unspecified: Secondary | ICD-10-CM

## 2017-08-17 DIAGNOSIS — R2681 Unsteadiness on feet: Secondary | ICD-10-CM | POA: Diagnosis not present

## 2017-08-17 DIAGNOSIS — C9110 Chronic lymphocytic leukemia of B-cell type not having achieved remission: Secondary | ICD-10-CM

## 2017-08-17 DIAGNOSIS — L0390 Cellulitis, unspecified: Secondary | ICD-10-CM | POA: Insufficient documentation

## 2017-08-17 DIAGNOSIS — I480 Paroxysmal atrial fibrillation: Secondary | ICD-10-CM

## 2017-08-17 DIAGNOSIS — R5381 Other malaise: Secondary | ICD-10-CM | POA: Insufficient documentation

## 2017-08-17 DIAGNOSIS — G4733 Obstructive sleep apnea (adult) (pediatric): Secondary | ICD-10-CM

## 2017-08-17 DIAGNOSIS — R42 Dizziness and giddiness: Secondary | ICD-10-CM | POA: Insufficient documentation

## 2017-08-17 DIAGNOSIS — R05 Cough: Secondary | ICD-10-CM | POA: Insufficient documentation

## 2017-08-17 DIAGNOSIS — R351 Nocturia: Secondary | ICD-10-CM

## 2017-08-17 MED ORDER — ZOSTER VAC RECOMB ADJUVANTED 50 MCG/0.5ML IM SUSR: 0.5000 mL | Freq: Once | INTRAMUSCULAR | 1 refills | Status: AC

## 2017-08-17 NOTE — Patient Instructions (Signed)

## 2017-08-18 ENCOUNTER — Inpatient Hospital Stay: Payer: Medicare Other | Admitting: Internal Medicine

## 2017-08-18 ENCOUNTER — Inpatient Hospital Stay: Payer: Medicare Other

## 2017-08-18 LAB — COMPREHENSIVE METABOLIC PANEL WITH GFR
ALT: 11 IU/L (ref 0–44)
AST: 21 IU/L (ref 0–40)
Albumin/Globulin Ratio: 2.9 — ABNORMAL HIGH (ref 1.2–2.2)
Albumin: 4.3 g/dL (ref 3.5–4.8)
Alkaline Phosphatase: 78 IU/L (ref 39–117)
BUN/Creatinine Ratio: 14 (ref 10–24)
BUN: 20 mg/dL (ref 8–27)
Bilirubin Total: 0.4 mg/dL (ref 0.0–1.2)
CO2: 26 mmol/L (ref 20–29)
Calcium: 8.1 mg/dL — ABNORMAL LOW (ref 8.6–10.2)
Chloride: 98 mmol/L (ref 96–106)
Creatinine, Ser: 1.43 mg/dL — ABNORMAL HIGH (ref 0.76–1.27)
GFR calc Af Amer: 55 mL/min/1.73 — ABNORMAL LOW (ref >59)
GFR calc non Af Amer: 47 mL/min/1.73 — ABNORMAL LOW (ref >59)
Globulin, Total: 1.5 g/dL (ref 1.5–4.5)
Glucose: 91 mg/dL (ref 65–99)
Potassium: 4.8 mmol/L (ref 3.5–5.2)
Sodium: 139 mmol/L (ref 134–144)
Total Protein: 5.8 g/dL — ABNORMAL LOW (ref 6.0–8.5)

## 2017-08-18 LAB — LIPID PANEL
Chol/HDL Ratio: 6.8 ratio — ABNORMAL HIGH (ref 0.0–5.0)
Cholesterol, Total: 123 mg/dL (ref 100–199)
HDL: 18 mg/dL — ABNORMAL LOW (ref >39)
LDL Calculated: 70 mg/dL (ref 0–99)
Triglycerides: 174 mg/dL — ABNORMAL HIGH (ref 0–149)
VLDL Cholesterol Cal: 35 mg/dL (ref 5–40)

## 2017-08-18 LAB — CBC
HEMATOCRIT: 41.5 % (ref 37.5–51.0)
Hemoglobin: 14.1 g/dL (ref 13.0–17.7)
MCH: 29.9 pg (ref 26.6–33.0)
MCHC: 34 g/dL (ref 31.5–35.7)
MCV: 88 fL (ref 79–97)
PLATELETS: 68 10*3/uL — AB (ref 150–379)
RBC: 4.71 x10E6/uL (ref 4.14–5.80)
RDW: 14.7 % (ref 12.3–15.4)
WBC: 171.8 10*3/uL — AB (ref 3.4–10.8)

## 2017-08-18 LAB — URIC ACID: URIC ACID: 5.7 mg/dL (ref 3.7–8.6)

## 2017-08-18 LAB — VITAMIN D 25 HYDROXY (VIT D DEFICIENCY, FRACTURES): VIT D 25 HYDROXY: 27.5 ng/mL — AB (ref 30.0–100.0)

## 2017-08-18 LAB — VITAMIN B12: Vitamin B-12: 377 pg/mL (ref 232–1245)

## 2017-08-18 LAB — TSH: TSH: 9.74 u[IU]/mL — ABNORMAL HIGH (ref 0.450–4.500)

## 2017-08-18 NOTE — Assessment & Plan Note (Deleted)
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 March 2018- PET scan shows massive splenomegaly/ mild mediastinal and RP/periportal adenopathy 1-2 cm in size; overall stable/improved imaging.  # Patient continues to be asymptomatic;  CBC shows white count of  153K /Hemoglobin 13.9/platelets 77 [stable;on eliquis- see discussion below]. Pending- FISH panel.   # No obvious indications for treatment at this time. Continue surveillance. Had a long discussion with the patient and his wife regarding- the need for treatment likely soon if it continues to bruise easily/platelets started trending down.   # Hyperkalemia- K- 5.7; repeat K on 24th; if still high recommend HOLDING lisinopril. If still elevated- recommend Kayexalte.  # I discussed at length regarding use of Gazyva monthly for 6 cycles.  Discussed the potential side effects of rare infections; infusion reactions. Also discussed regarding use of Ibrutinib- which will be indicated treatments.   # Hx of A.fib- on eliquis. Stable.  # follow up in 1months/labs/MD.   CC: Dr.Singh/ Dr.Kowalski.

## 2017-08-18 NOTE — Progress Notes (Deleted)
[facility] OFFICE PROGRESS NOTE  Patient Care Team: [name], MD as PCP - General (Family Medicine)   Oncology History   SUMMARY OF HEMATOLOGIC HISTORY:  # CHRONIC LYMPHOCYTIC LEUKEMIA/SLL- March 2017-CLL FISH- 95% OF NUCLEI POSITIVE FOR 13Q DELETION; March 2017- PET 1-2Cm LN/Splenomegaly. 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 NEGATIVE for CCND1/IGH, ATM, chromosome 12, and TP53  were normal.   #CHF/CAD s/p Defib- on Eliquis  [Dr.Kowalski]; Orthostatic hypotension      CLL (chronic lymphocytic leukemia) ([place])   02/07/2014 Initial Diagnosis    Chronic lymphocytic leukemia (HCC)       INTERVAL HISTORY:  A very pleasant 76 year old male patient with above history of chronic lymphocytic leukemia currently on surveillance Is here for follow-up.  Patient continues to have easy bruising. But no spontaneous bleeding. Denies any nosebleeds or gum bleeding. Otherwise denies any unusual shortness of breath or cough. Denies any weight loss. Denies any fevers or frequent infections. No night sweats. No lumps or bumps. Denies any blood in stools or black colored stools. Denies any left upper quadrant pain.   REVIEW OF SYSTEMS:  A complete 10 point review of system is done which is negative except mentioned above/history of present illness.   PAST MEDICAL HISTORY :  Past Medical History:  Diagnosis Date  . CLL (chronic lymphocytic leukemia) ([place])   . CPAP (continuous positive airway pressure) dependence   . Heart failure ([place])   . HOH (hard of hearing)   . Presence of automatic (implantable) cardiac defibrillator   . Sleep apnea   . Upper respiratory infection    chronic    PAST SURGICAL HISTORY :   Past Surgical History:  Procedure Laterality Date  . CARDIAC DEFIBRILLATOR PLACEMENT  2015    FAMILY HISTORY :   Family History  Problem Relation Age of Onset  . Diabetes Mother    . CAD Mother   . Diabetes Father   . CAD Father     SOCIAL HISTORY:   Social History   Tobacco Use  . Smoking status: Former Research scientist (life sciences)  . Smokeless tobacco: Current User    Types: Chew  Substance Use Topics  . Alcohol use: Yes    Comment: 1 beer a month  . Drug use: No    ALLERGIES:  has No Known Allergies.  MEDICATIONS:  Current Outpatient Medications  Medication Sig Dispense Refill  . acetaminophen (TYLENOL) 500 MG tablet Take 1,000 mg every 8 (eight) hours as needed by mouth.    Marland Kitchen albuterol (PROAIR HFA) 108 (90 Base) MCG/ACT inhaler Inhale 2 puffs every 6 (six) hours as needed into the lungs.    Marland Kitchen allopurinol (ZYLOPRIM) 100 MG tablet TAKE 1 TABLET BY MOUTH ONCE EVERY EVENING    . amiodarone (PACERONE) 200 MG tablet TAKE ONE-HALF (1/2) TABLET DAILY    . apixaban (ELIQUIS) 5 MG TABS tablet TAKE 1 TABLET EVERY 12 HOURS    . lisinopril (PRINIVIL,ZESTRIL) 5 MG tablet Take 2.5 mg daily by mouth.    . metoprolol tartrate (LOPRESSOR) 25 MG tablet Take 25 mg 2 (two) times daily by mouth.    Marland Kitchen rOPINIRole (REQUIP) 0.25 MG tablet TAKE 1 TABLET BY MOUTH EVERY EVENING FOR2 DAYS,THEN INCREASE TO 2 TABLETS EVERY EVENING FOR RESTLESS LEGS    . tiotropium (SPIRIVA HANDIHALER) 18 MCG inhalation capsule INHALE THE CONTENTS OF 1 CAPSULE ONCE DAILY    . torsemide (DEMADEX) 20  MG tablet Take 10 mg daily by mouth.     No current facility-administered medications for this visit.     PHYSICAL EXAMINATION:   There were no vitals taken for this visit.  There were no vitals filed for this visit.  GENERAL: Well-nourished well-developed; Alert, no distress and comfortable. He is Accompanied by family.. Walking himself EYES: No pallor or icterus OROPHARYNX: no thrush or ulceration. NECK: supple, no masses felt LYMPH:  no palpable lymphadenopathy in the cervical, axillary or inguinal regions LUNGS: clear to auscultation and  No wheeze or crackles HEART/CVS: regular rate & rhythm and no murmurs; No  lower extremity edema ABDOMEN:abdomen soft, non-tender and normal bowel sounds; positive for splenomegaly.  Musculoskeletal:no cyanosis of digits and no clubbing  PSYCH: alert & oriented x 3 with fluent speech NEURO: no focal motor/sensory deficits SKIN:  no rashes or significant lesions  LABORATORY DATA:  I have reviewed the data as listed    Component Value Date/Time   NA 139 08/17/2017 1551   NA 136 07/25/2014 0414   K 4.8 08/17/2017 1551   K 5.6 (H) 07/25/2014 0414   CL 98 08/17/2017 1551   CL 101 07/25/2014 0414   CO2 26 08/17/2017 1551   CO2 24 07/25/2014 0414   GLUCOSE 91 08/17/2017 1551   GLUCOSE 94 05/19/2017 0959   GLUCOSE 143 (H) 07/25/2014 0414   BUN 20 08/17/2017 1551   BUN 33 (H) 07/25/2014 0414   CREATININE 1.43 (H) 08/17/2017 1551   CREATININE 1.34 (H) 01/15/2015 0956   CALCIUM 8.1 (L) 08/17/2017 1551   CALCIUM 8.3 (L) 07/25/2014 0414   PROT 5.8 (L) 08/17/2017 1551   PROT 6.4 07/24/2014 0937   ALBUMIN 4.3 08/17/2017 1551   ALBUMIN 4.0 07/24/2014 0937   AST 21 08/17/2017 1551   AST 31 07/24/2014 0937   ALT 11 08/17/2017 1551   ALT 23 07/24/2014 0937   ALKPHOS 78 08/17/2017 1551   ALKPHOS 53 07/24/2014 0937   BILITOT 0.4 08/17/2017 1551   BILITOT 1.1 (H) 07/24/2014 0937   GFRNONAA 47 (L) 08/17/2017 1551   GFRNONAA 52 (L) 01/15/2015 0956   GFRAA 55 (L) 08/17/2017 1551   GFRAA >60 01/15/2015 0956    No results found for: SPEP, UPEP  Lab Results  Component Value Date   WBC 171.8 (>) 08/17/2017   NEUTROABS 7.6 (H) 05/19/2017   HGB 14.1 08/17/2017   HCT 41.5 08/17/2017   MCV 88 08/17/2017   PLT 68 (LL) 08/17/2017      Chemistry      Component Value Date/Time   NA 139 08/17/2017 1551   NA 136 07/25/2014 0414   K 4.8 08/17/2017 1551   K 5.6 (H) 07/25/2014 0414   CL 98 08/17/2017 1551   CL 101 07/25/2014 0414   CO2 26 08/17/2017 1551   CO2 24 07/25/2014 0414   BUN 20 08/17/2017 1551   BUN 33 (H) 07/25/2014 0414   CREATININE 1.43 (H)  08/17/2017 1551   CREATININE 1.34 (H) 01/15/2015 0956      Component Value Date/Time   CALCIUM 8.1 (L) 08/17/2017 1551   CALCIUM 8.3 (L) 07/25/2014 0414   ALKPHOS 78 08/17/2017 1551   ALKPHOS 53 07/24/2014 0937   AST 21 08/17/2017 1551   AST 31 07/24/2014 0937   ALT 11 08/17/2017 1551   ALT 23 07/24/2014 0937   BILITOT 0.4 08/17/2017 1551   BILITOT 1.1 (H) 07/24/2014 0937         ASSESSMENT & PLAN:  No problem-specific Assessment & Plan notes found for this encounter.     Cammie Sickle, MD 08/18/2017 9:02 AM

## 2017-08-20 ENCOUNTER — Other Ambulatory Visit: Payer: Self-pay | Admitting: Family Medicine

## 2017-08-20 DIAGNOSIS — N401 Enlarged prostate with lower urinary tract symptoms: Secondary | ICD-10-CM | POA: Insufficient documentation

## 2017-08-20 DIAGNOSIS — R351 Nocturia: Secondary | ICD-10-CM

## 2017-08-20 MED ORDER — LEVOTHYROXINE SODIUM 25 MCG PO TABS: 25.0000 ug | ORAL_TABLET | Freq: Every day | ORAL | 2 refills | Status: DC

## 2017-08-20 MED ORDER — VITAMIN D (CHOLECALCIFEROL) 25 MCG (1000 UT) PO CAPS: 1.0000 | ORAL_CAPSULE | Freq: Every day | ORAL | Status: DC

## 2017-08-20 NOTE — Progress Notes (Signed)
Date:  08/17/2017   Name:  Clifford Herrera   DOB:  05-03-1941   MRN:  [id number]  PCP:  [name], MD    Chief Complaint: Establish Care (transfering care from [name]) and Fall [place] off porch on 08/08/2017 seen [facility] ER was D/C with one broken rib and later had groin muscle pain. )   History of Present Illness:  This is a 76 y.o. male seen for initial visit. [place] off porch last week with R rib fx and groin pull, groin still bothering. CAD/CHF/afib stable followed by cards on amio, Eliquis, metoprolol, lisinopril, and torsemide, has AICD. CLL with elevated WBC counts since 2003 followed by oncology. 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 RLS on Requip past year. OSA with CPAP, on allopurinol for gout, no attacks in 5 yrs. Takes Xanax q2-3d for anxiety. BPH with nocturia x 2-3/night stable. CKD3, last eGFR 54. C/o fatigue, occ orthostasis, weight stable. Father died heart dz 7, mother died MI/DM 62. Has had Pneumovax and Zostrix only. Colonoscopy 2013 normal.  Review of Systems:  Review of Systems  Constitutional: Negative for chills and fever.  HENT: Negative for ear pain, sinus pain and trouble swallowing.   Eyes: Negative for pain.  Respiratory: Negative for cough.   Cardiovascular: Negative for chest pain.  Gastrointestinal: Negative for abdominal pain.  Genitourinary: Negative for difficulty urinating.  Musculoskeletal: Negative for joint swelling.  Neurological: Negative for syncope and light-headedness.  Hematological: Negative for adenopathy.    Patient Active Problem List   Diagnosis Date Noted  . Acute upper respiratory infection 08/17/2017  . Allergic rhinitis 08/17/2017  . Cellulitis 08/17/2017  . Congestion of nasal sinus 08/17/2017  . Cough 08/17/2017  . Dizziness 08/17/2017  . Impacted cerumen 08/17/2017  . Malaise and fatigue 08/17/2017  . Nonvenomous insect bite of multiple sites 08/17/2017  . Gait instability 08/17/2017  . Hypocalcemia 08/17/2017  .  CKD (chronic kidney disease) stage 3, GFR 30-59 ml/min (HCC) 08/17/2017  . Gout 08/17/2017  . RLS (restless legs syndrome) 10/23/2016  . Generalized rash 09/30/2016  . 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 Prostate enlargement 09/04/2016  . Moderate mitral insufficiency 02/21/2015  . CAD (coronary artery disease) 09/18/2014  . Paroxysmal A-fib ([place]) 09/01/2014  . NICM (nonischemic cardiomyopathy) ([place]) 07/25/2014  . Opacity of lung on imaging study 07/20/2014  . 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 Mediastinal adenopathy 03/12/2014  . CLL (chronic lymphocytic leukemia) ([place]) 02/07/2014  . COPD, mild ([place]) 02/07/2014  . Obstructive sleep apnea 02/07/2014  . Polycythemia 02/07/2014  . DJD (degenerative joint disease) of hip 06/24/2011    Prior to Admission medications   Medication Sig Start Date End Date Taking? Authorizing Provider  acetaminophen (TYLENOL) 500 MG tablet Take 1,000 mg every 8 (eight) hours as needed by mouth.   Yes [provider]  albuterol (PROAIR HFA) 108 (90 Base) MCG/ACT inhaler Inhale 2 puffs every 6 (six) hours as needed into the lungs. 07/20/14  Yes [provider]  allopurinol (ZYLOPRIM) 100 MG tablet TAKE 1 TABLET BY MOUTH ONCE EVERY EVENING 05/25/17  Yes [provider]  amiodarone (PACERONE) 200 MG tablet TAKE ONE-HALF (1/2) TABLET DAILY 08/25/16  Yes [provider]  apixaban (ELIQUIS) 5 MG TABS tablet TAKE 1 TABLET EVERY 12 HOURS 05/27/17  Yes [provider]  lisinopril (PRINIVIL,ZESTRIL) 5 MG tablet Take 2.5 mg daily by mouth.   Yes [provider]  metoprolol tartrate (LOPRESSOR)  25 MG tablet Take 25 mg 2 (two) times daily by mouth.   Yes [provider]  rOPINIRole (REQUIP) 0.25 MG tablet TAKE 1 TABLET BY MOUTH EVERY EVENING FOR2 DAYS,THEN INCREASE TO 2 TABLETS EVERY EVENING FOR RESTLESS LEGS 02/10/17  Yes [provider]  tiotropium (SPIRIVA HANDIHALER) 18 MCG inhalation capsule INHALE THE CONTENTS OF 1 CAPSULE ONCE DAILY 06/08/17  Yes [provider]  torsemide (DEMADEX) 20 MG tablet Take 10 mg daily by mouth. 08/14/16  Yes [provider]    No Known Allergies  Past Surgical History:  Procedure Laterality Date  . CARDIAC DEFIBRILLATOR PLACEMENT  2015    Social History   Tobacco Use  . Smoking status: Former Research scientist (life sciences)  . Smokeless tobacco: Current User    Types: Chew  Substance Use Topics  . Alcohol use: Yes    Comment: 1 beer a month  . Drug use: No    Family History  Problem Relation Age of Onset  . Diabetes Mother   . CAD Mother   . Diabetes Father   . CAD Father     Medication list has been reviewed and updated.  Physical Examination: BP 98/68   Pulse 78   Resp 16   Ht '6\' 1"'  (1.854 m)   Wt 212 lb 9.6 oz (96.4 kg)   SpO2 98%   BMI 28.05 kg/m   Physical Exam  Constitutional: He is oriented to person, place, and time. He appears well-developed and well-nourished.  HENT:  Head: Normocephalic and atraumatic.  Right Ear: External ear normal.  Left Ear: External ear normal.  Nose: Nose normal.  Mouth/Throat: Oropharynx is clear and moist.  Eyes: Conjunctivae and EOM are normal. Pupils are equal, round, and reactive to light.  Neck: Neck supple. No thyromegaly present.  Cardiovascular: Normal rate, regular rhythm and normal heart sounds.  Pulmonary/Chest: Effort normal and breath sounds normal.  Abdominal: Soft. He exhibits no distension and no mass. There is no tenderness.  Musculoskeletal:  Trace BLE edema R groin tender but no mass or hernia appreciated  Lymphadenopathy:    He has no cervical adenopathy.  Neurological: He is alert and oriented to person, place, and time. Coordination normal.  Romberg pos, gait unstable  Skin: Skin is warm and dry.  Psychiatric: He has a normal mood and affect. His behavior is normal.  Nursing note and vitals  reviewed.   Assessment and Plan:  1. Congestive heart failure with cardiomyopathy ([place]) Currently compensated on metoprolol, lisinopril, and torsemide, consider spironolactone, followed by cards - Comprehensive Metabolic Panel (CMET) - CBC - TSH - Lipid Profile  2. Paroxysmal A-fib (HCC) On Eliquis/amio, consider d/c amio  3. CKD (chronic kidney disease) stage 3, GFR 30-59 ml/min (HCC) Avoid NSAIDS, use Tylenol instead  4. CLL (chronic lymphocytic leukemia) (HCC) Stable, followed by onco  5. COPD, mild (HCC) Cont Spiriva, prn albuterol  6. Groin strain, right, initial encounter Healing slowly, monitor  7. OSA on CPAP Continue  8. BPH associated with nocturia Consider Flomax if sxs progress  9. Chronic gout without tophus, unspecified cause, unspecified site On allopurinol - Uric acid  10. Gait instability Consider PT - B12  11. Hypocalcemia - Vitamin D (25 hydroxy)  12. Need for pneumococcal vaccination - Pneumococcal conjugate vaccine 13-valent  13. Need for diphtheria-tetanus-pertussis (Tdap) vaccine - Tdap vaccine greater than or equal to 7yo IM  14. Need for influenza vaccination - Flu vaccine HIGH DOSE PF (Fluzone High dose)  15. Need for  zoster vaccination Shingrix rx sent  16. Med review D/c Xanax  Return in about 4 weeks (around 09/14/2017).   One hour spent with pt over half in counseling  Jahniyah Revere M. Malisha Mabey, [facility]  08/20/2017

## 2017-08-25 ENCOUNTER — Inpatient Hospital Stay: Payer: Medicare Other

## 2017-08-25 ENCOUNTER — Encounter: Payer: Self-pay | Admitting: Internal Medicine

## 2017-08-25 ENCOUNTER — Inpatient Hospital Stay: Payer: Medicare Other | Attending: Internal Medicine | Admitting: Internal Medicine

## 2017-08-25 VITALS — BP 93/52 | HR 81 | Temp 98.1°F | Resp 16 | Wt 215.4 lb

## 2017-08-25 DIAGNOSIS — Z79899 Other long term (current) drug therapy: Secondary | ICD-10-CM | POA: Diagnosis not present

## 2017-08-25 DIAGNOSIS — I2510 Atherosclerotic heart disease of native coronary artery without angina pectoris: Secondary | ICD-10-CM | POA: Insufficient documentation

## 2017-08-25 DIAGNOSIS — Z87891 Personal history of nicotine dependence: Secondary | ICD-10-CM | POA: Insufficient documentation

## 2017-08-25 DIAGNOSIS — C9110 Chronic lymphocytic leukemia of B-cell type not having achieved remission: Secondary | ICD-10-CM | POA: Diagnosis not present

## 2017-08-25 DIAGNOSIS — I509 Heart failure, unspecified: Secondary | ICD-10-CM | POA: Diagnosis not present

## 2017-08-25 DIAGNOSIS — I951 Orthostatic hypotension: Secondary | ICD-10-CM | POA: Insufficient documentation

## 2017-08-25 DIAGNOSIS — Z7901 Long term (current) use of anticoagulants: Secondary | ICD-10-CM | POA: Diagnosis not present

## 2017-08-25 DIAGNOSIS — R590 Localized enlarged lymph nodes: Secondary | ICD-10-CM | POA: Insufficient documentation

## 2017-08-25 DIAGNOSIS — R161 Splenomegaly, not elsewhere classified: Secondary | ICD-10-CM | POA: Insufficient documentation

## 2017-08-25 DIAGNOSIS — S2232XS Fracture of one rib, left side, sequela: Secondary | ICD-10-CM | POA: Diagnosis not present

## 2017-08-25 DIAGNOSIS — I4891 Unspecified atrial fibrillation: Secondary | ICD-10-CM | POA: Diagnosis not present

## 2017-08-25 DIAGNOSIS — Z9181 History of falling: Secondary | ICD-10-CM | POA: Insufficient documentation

## 2017-08-25 DIAGNOSIS — Z95810 Presence of automatic (implantable) cardiac defibrillator: Secondary | ICD-10-CM | POA: Diagnosis not present

## 2017-08-25 DIAGNOSIS — C8303 Small cell B-cell lymphoma, intra-abdominal lymph nodes: Secondary | ICD-10-CM

## 2017-08-25 LAB — CBC WITH DIFFERENTIAL/PLATELET
BASOS PCT: 0 %
Basophils Absolute: 0.6 10*3/uL — ABNORMAL HIGH (ref 0–0.1)
Eosinophils Absolute: 0.4 10*3/uL (ref 0–0.7)
Eosinophils Relative: 0 %
HEMATOCRIT: 38.8 % — AB (ref 40.0–52.0)
HEMOGLOBIN: 12.9 g/dL — AB (ref 13.0–18.0)
LYMPHS ABS: 148.5 10*3/uL — AB (ref 1.0–3.6)
LYMPHS PCT: 91 %
MCH: 29.7 pg (ref 26.0–34.0)
MCHC: 33.2 g/dL (ref 32.0–36.0)
MCV: 89.3 fL (ref 80.0–100.0)
Monocytes Absolute: 2.8 10*3/uL — ABNORMAL HIGH (ref 0.2–1.0)
Monocytes Relative: 2 %
NEUTROS ABS: 12.1 10*3/uL — AB (ref 1.4–6.5)
NEUTROS PCT: 7 %
Platelets: 76 10*3/uL — ABNORMAL LOW (ref 150–440)
RBC: 4.34 MIL/uL — ABNORMAL LOW (ref 4.40–5.90)
RDW: 14.5 % (ref 11.5–14.5)
WBC: 164.3 10*3/uL — AB (ref 3.8–10.6)

## 2017-08-25 LAB — BASIC METABOLIC PANEL
Anion gap: 6 (ref 5–15)
BUN: 19 mg/dL (ref 6–20)
CHLORIDE: 98 mmol/L — AB (ref 101–111)
CO2: 28 mmol/L (ref 22–32)
CREATININE: 1.22 mg/dL (ref 0.61–1.24)
Calcium: 8 mg/dL — ABNORMAL LOW (ref 8.9–10.3)
GFR calc non Af Amer: 56 mL/min — ABNORMAL LOW (ref >60)
Glucose, Bld: 84 mg/dL (ref 65–99)
Potassium: 4.6 mmol/L (ref 3.5–5.1)
Sodium: 132 mmol/L — ABNORMAL LOW (ref 135–145)

## 2017-08-25 LAB — LACTATE DEHYDROGENASE: LDH: 141 U/L (ref 98–192)

## 2017-08-25 NOTE — Progress Notes (Signed)
[facility] OFFICE PROGRESS NOTE  Patient Care Team: [name], MD as PCP - General (Family Medicine)   Oncology History   SUMMARY OF HEMATOLOGIC HISTORY:  # CHRONIC LYMPHOCYTIC LEUKEMIA/SLL- March 2017-CLL FISH- 95% OF NUCLEI POSITIVE FOR 13Q DELETION; March 2017- PET 1-2Cm LN/Splenomegaly. 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 NEGATIVE for CCND1/IGH, ATM, chromosome 12, and TP53  were normal.   #CHF/CAD s/p Defib- on Eliquis  [Dr.Kowalski]; Orthostatic hypotension      CLL (chronic lymphocytic leukemia) ([place])   02/07/2014 Initial Diagnosis    Chronic lymphocytic leukemia (HCC)       INTERVAL HISTORY:  A very pleasant 76 year old male patient with above history of chronic lymphocytic leukemia currently on surveillance Is here for follow-up.  Unfortunately patient had a mechanical fall at home. He bruised and fractured his left rib. Denies hitting his head.  He continues to have easy bruising [patient is on Eliquis for A. Fib]. He denies spontaneous bleeding. Denies any nosebleeds or gum bleeding. Otherwise denies any unusual shortness of breath or cough. Denies any weight loss. Denies any fevers or frequent infections. No night sweats. No lumps or bumps. Denies any blood in stools or black colored stools. Denies any left upper quadrant pain.   REVIEW OF SYSTEMS:  A complete 10 point review of system is done which is negative except mentioned above/history of present illness.   PAST MEDICAL HISTORY :  Past Medical History:  Diagnosis Date  . CLL (chronic lymphocytic leukemia) ([place])   . CPAP (continuous positive airway pressure) dependence   . Heart failure ([place])   . HOH (hard of hearing)   . Presence of automatic (implantable) cardiac defibrillator   . Sleep apnea   . Upper respiratory infection    chronic    PAST SURGICAL HISTORY :   Past Surgical History:  Procedure  Laterality Date  . CARDIAC DEFIBRILLATOR PLACEMENT  2015    FAMILY HISTORY :   Family History  Problem Relation Age of Onset  . Diabetes Mother   . CAD Mother   . Diabetes Father   . CAD Father     SOCIAL HISTORY:   Social History   Tobacco Use  . Smoking status: Former Research scientist (life sciences)  . Smokeless tobacco: Current User    Types: Chew  Substance Use Topics  . Alcohol use: Yes    Comment: 1 beer a month  . Drug use: No    ALLERGIES:  has No Known Allergies.  MEDICATIONS:  Current Outpatient Medications  Medication Sig Dispense Refill  . acetaminophen (TYLENOL) 500 MG tablet Take 1,000 mg every 8 (eight) hours as needed by mouth.    Marland Kitchen albuterol (PROAIR HFA) 108 (90 Base) MCG/ACT inhaler Inhale 2 puffs every 6 (six) hours as needed into the lungs.    Marland Kitchen allopurinol (ZYLOPRIM) 100 MG tablet TAKE 1 TABLET BY MOUTH ONCE EVERY EVENING    . amiodarone (PACERONE) 200 MG tablet TAKE ONE-HALF (1/2) TABLET DAILY    . apixaban (ELIQUIS) 5 MG TABS tablet TAKE 1 TABLET EVERY 12 HOURS    . levothyroxine (SYNTHROID) 25 MCG tablet Take 1 tablet (25 mcg total) by mouth daily before breakfast. 30 tablet 2  . lisinopril (PRINIVIL,ZESTRIL) 5 MG tablet Take 2.5 mg daily by mouth.    . metoprolol tartrate (LOPRESSOR) 25 MG tablet Take 25 mg 2 (two) times daily by mouth.    Marland Kitchen rOPINIRole (  REQUIP) 0.25 MG tablet TAKE 1 TABLET BY MOUTH EVERY EVENING FOR2 DAYS,THEN INCREASE TO 2 TABLETS EVERY EVENING FOR RESTLESS LEGS    . tiotropium (SPIRIVA HANDIHALER) 18 MCG inhalation capsule INHALE THE CONTENTS OF 1 CAPSULE ONCE DAILY    . torsemide (DEMADEX) 20 MG tablet Take 10 mg daily by mouth.    . Vitamin D, Cholecalciferol, 1000 units CAPS Take 1 capsule by mouth daily. 60 capsule    No current facility-administered medications for this visit.     PHYSICAL EXAMINATION:   BP (!) 93/52 (BP Location: Left Arm, Patient Position: Sitting)   Pulse 81   Temp 98.1 F (36.7 C) (Tympanic)   Resp 16   Wt 215 lb 6.2  oz (97.7 kg)   BMI 28.42 kg/m   Filed Weights   08/25/17 1427  Weight: 215 lb 6.2 oz (97.7 kg)    GENERAL: Well-nourished well-developed; Alert, no distress and comfortable. He is Accompanied by family.. Walking himself EYES: No pallor or icterus OROPHARYNX: no thrush or ulceration. NECK: supple, no masses felt LYMPH:  no palpable lymphadenopathy in the cervical or inguinal regions. Positive for lymphadenopathy in the left axilla. LUNGS: clear to auscultation and  No wheeze or crackles HEART/CVS: regular rate & rhythm and no murmurs; No lower extremity edema ABDOMEN:abdomen soft, non-tender and normal bowel sounds; positive for splenomegaly.  Musculoskeletal:no cyanosis of digits and no clubbing  PSYCH: alert & oriented x 3 with fluent speech NEURO: no focal motor/sensory deficits SKIN:  no rashes or significant lesions  LABORATORY DATA:  I have reviewed the data as listed    Component Value Date/Time   NA 132 (L) 08/25/2017 1353   NA 139 08/17/2017 1551   NA 136 07/25/2014 0414   K 4.6 08/25/2017 1353   K 5.6 (H) 07/25/2014 0414   CL 98 (L) 08/25/2017 1353   CL 101 07/25/2014 0414   CO2 28 08/25/2017 1353   CO2 24 07/25/2014 0414   GLUCOSE 84 08/25/2017 1353   GLUCOSE 143 (H) 07/25/2014 0414   BUN 19 08/25/2017 1353   BUN 20 08/17/2017 1551   BUN 33 (H) 07/25/2014 0414   CREATININE 1.22 08/25/2017 1353   CREATININE 1.34 (H) 01/15/2015 0956   CALCIUM 8.0 (L) 08/25/2017 1353   CALCIUM 8.3 (L) 07/25/2014 0414   PROT 5.8 (L) 08/17/2017 1551   PROT 6.4 07/24/2014 0937   ALBUMIN 4.3 08/17/2017 1551   ALBUMIN 4.0 07/24/2014 0937   AST 21 08/17/2017 1551   AST 31 07/24/2014 0937   ALT 11 08/17/2017 1551   ALT 23 07/24/2014 0937   ALKPHOS 78 08/17/2017 1551   ALKPHOS 53 07/24/2014 0937   BILITOT 0.4 08/17/2017 1551   BILITOT 1.1 (H) 07/24/2014 0937   GFRNONAA 56 (L) 08/25/2017 1353   GFRNONAA 52 (L) 01/15/2015 0956   GFRAA >60 08/25/2017 1353   GFRAA >60 01/15/2015  0956    No results found for: SPEP, UPEP  Lab Results  Component Value Date   WBC 164.3 (HH) 08/25/2017   NEUTROABS 12.1 (H) 08/25/2017   HGB 12.9 (L) 08/25/2017   HCT 38.8 (L) 08/25/2017   MCV 89.3 08/25/2017   PLT 76 (L) 08/25/2017      Chemistry      Component Value Date/Time   NA 132 (L) 08/25/2017 1353   NA 139 08/17/2017 1551   NA 136 07/25/2014 0414   K 4.6 08/25/2017 1353   K 5.6 (H) 07/25/2014 0414   CL 98 (L) 08/25/2017 1353  CL 101 07/25/2014 0414   CO2 28 08/25/2017 1353   CO2 24 07/25/2014 0414   BUN 19 08/25/2017 1353   BUN 20 08/17/2017 1551   BUN 33 (H) 07/25/2014 0414   CREATININE 1.22 08/25/2017 1353   CREATININE 1.34 (H) 01/15/2015 0956      Component Value Date/Time   CALCIUM 8.0 (L) 08/25/2017 1353   CALCIUM 8.3 (L) 07/25/2014 0414   ALKPHOS 78 08/17/2017 1551   ALKPHOS 53 07/24/2014 0937   AST 21 08/17/2017 1551   AST 31 07/24/2014 0937   ALT 11 08/17/2017 1551   ALT 23 07/24/2014 0937   BILITOT 0.4 08/17/2017 1551   BILITOT 1.1 (H) 07/24/2014 0937         ASSESSMENT & PLAN:   CLL (chronic lymphocytic leukemia) (HCC) # 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 March 2018- PET scan shows massive splenomegaly/ mild mediastinal and RP/periportal adenopathy 1-2 cm in size; overall stable/improved imaging.  # Patient continues to be asymptomatic;  CBC shows white count of  174K [slowly trending up] /Hemoglobin 12.7/platelets 76 [stable;on eliquis- see discussion below]. Aug 2018- FISH panel- 80%- 13 q del.    # No obvious indications for treatment at this time. Continue surveillance. Had a long discussion with the patient and his wife regarding- the need for treatment likely soon if it continues to bruise easily/platelets started trending down.   # I discussed at length regarding use of Gazyva monthly for 6 cycles.  Discussed the potential side effects of rare infections;  infusion reactions. My preference would be Gazyva as the treatments would be fixed duration; clinic should have a great response from the treatment. Also discussed regarding use of Ibrutinib- which will be indicated treatments.   # Hx of A.fib- on eliquis [70s]. Stable.  # follow up in 4 months/labs/MD.   CC: Dr.Singh/ Dr.Kowalski.      Cammie Sickle, MD 08/25/2017 6:36 PM

## 2017-08-25 NOTE — Assessment & Plan Note (Addendum)
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 March 2018- PET scan shows massive splenomegaly/ mild mediastinal and RP/periportal adenopathy 1-2 cm in size; overall stable/improved imaging.  # Patient continues to be asymptomatic;  CBC shows white count of  174K [slowly trending up] /Hemoglobin 12.7/platelets 76 [stable;on eliquis- see discussion below]. Aug 2018- FISH panel- 80%- 13 q del.    # No obvious indications for treatment at this time. Continue surveillance. Had a long discussion with the patient and his wife regarding- the need for treatment likely soon if it continues to bruise easily/platelets started trending down.   # I discussed at length regarding use of Gazyva monthly for 6 cycles.  Discussed the potential side effects of rare infections; infusion reactions. My preference would be Gazyva as the treatments would be fixed duration; clinic should have a great response from the treatment. Also discussed regarding use of Ibrutinib- which will be indicated treatments.   # Hx of A.fib- on eliquis [70s]. Stable.  # follow up in 4 months/labs/MD.   CC: Dr.Singh/ Dr.Kowalski.

## 2017-09-16 ENCOUNTER — Ambulatory Visit: Payer: Medicare Other | Admitting: Family Medicine

## 2017-09-16 ENCOUNTER — Encounter: Payer: Self-pay | Admitting: Family Medicine

## 2017-09-16 VITALS — BP 124/62 | HR 60 | Ht 73.0 in | Wt 215.0 lb

## 2017-09-16 DIAGNOSIS — J449 Chronic obstructive pulmonary disease, unspecified: Secondary | ICD-10-CM | POA: Diagnosis not present

## 2017-09-16 DIAGNOSIS — G4733 Obstructive sleep apnea (adult) (pediatric): Secondary | ICD-10-CM

## 2017-09-16 DIAGNOSIS — C9190 Lymphoid leukemia, unspecified not having achieved remission: Secondary | ICD-10-CM

## 2017-09-16 DIAGNOSIS — C9110 Chronic lymphocytic leukemia of B-cell type not having achieved remission: Secondary | ICD-10-CM

## 2017-09-16 DIAGNOSIS — I429 Cardiomyopathy, unspecified: Secondary | ICD-10-CM

## 2017-09-16 DIAGNOSIS — N183 Chronic kidney disease, stage 3 unspecified: Secondary | ICD-10-CM

## 2017-09-16 DIAGNOSIS — I509 Heart failure, unspecified: Secondary | ICD-10-CM

## 2017-09-16 DIAGNOSIS — N401 Enlarged prostate with lower urinary tract symptoms: Secondary | ICD-10-CM

## 2017-09-16 DIAGNOSIS — E039 Hypothyroidism, unspecified: Secondary | ICD-10-CM

## 2017-09-16 DIAGNOSIS — R351 Nocturia: Secondary | ICD-10-CM

## 2017-09-16 DIAGNOSIS — G2581 Restless legs syndrome: Secondary | ICD-10-CM

## 2017-09-16 DIAGNOSIS — Z9989 Dependence on other enabling machines and devices: Secondary | ICD-10-CM

## 2017-09-16 DIAGNOSIS — E559 Vitamin D deficiency, unspecified: Secondary | ICD-10-CM

## 2017-09-16 DIAGNOSIS — I480 Paroxysmal atrial fibrillation: Secondary | ICD-10-CM | POA: Diagnosis not present

## 2017-09-16 DIAGNOSIS — M1A9XX0 Chronic gout, unspecified, without tophus (tophi): Secondary | ICD-10-CM | POA: Diagnosis not present

## 2017-09-16 MED ORDER — SPIRONOLACTONE 25 MG PO TABS: 12.5000 mg | ORAL_TABLET | Freq: Every day | ORAL | 2 refills | Status: DC

## 2017-09-16 MED ORDER — TAMSULOSIN HCL 0.4 MG PO CAPS: 0.4000 mg | ORAL_CAPSULE | Freq: Every day | ORAL | 2 refills | Status: DC

## 2017-09-16 NOTE — Progress Notes (Signed)
Date:  09/16/2017   Name:  Clifford Herrera   DOB:  1941/04/01   MRN:  [id number]  PCP:  [name], MD    Chief Complaint: Follow-up   History of Present Illness:  This is a 76 y.o. male seen for one month f/u. Blood work last visit showed hypothyroid, low vit D, on Synthroid/supplement. CHF stable, edema about the same, remains on Eliquis/amio for Pafib. Saw onco for CLL, no new interventions. C/o nocturia x 3, disrupting sleep. Taking Alka-Seltzer cold daily for congestion.   Review of Systems:  Review of Systems  Constitutional: Negative for chills and fever.  Respiratory: Negative for cough and shortness of breath.   Cardiovascular: Negative for chest pain.  Gastrointestinal: Negative for abdominal pain.  Genitourinary: Negative for difficulty urinating.  Neurological: Negative for syncope and light-headedness.    Patient Active Problem List   Diagnosis Date Noted  . Vitamin D deficiency 09/16/2017  . Hypothyroidism 09/16/2017  . BPH associated with nocturia 08/20/2017  . Allergic rhinitis 08/17/2017  . Cellulitis 08/17/2017  . Cough 08/17/2017  . Dizziness 08/17/2017  . Impacted cerumen 08/17/2017  . Malaise and fatigue 08/17/2017  . Nonvenomous insect bite of multiple sites 08/17/2017  . Gait instability 08/17/2017  . Hypocalcemia 08/17/2017  . CKD (chronic kidney disease) stage 3, GFR 30-59 ml/min (HCC) 08/17/2017  . Gout 08/17/2017  . RLS (restless legs syndrome) 10/23/2016  . Generalized rash 09/30/2016  . 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 Prostate enlargement 09/04/2016  . Moderate mitral insufficiency 02/21/2015  . CAD (coronary artery disease) 09/18/2014  . Paroxysmal A-fib ([place]) 09/01/2014  . Opacity of lung on imaging study 07/20/2014  . 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 Mediastinal adenopathy 03/12/2014  . CLL (chronic lymphocytic leukemia) ([place]) 02/07/2014  . COPD, mild  ([place]) 02/07/2014  . OSA on CPAP 02/07/2014  . Polycythemia 02/07/2014  . DJD (degenerative joint disease) of hip 06/24/2011    Prior to Admission medications   Medication Sig Start Date End Date Taking? Authorizing Provider  acetaminophen (TYLENOL) 500 MG tablet Take 1,000 mg every 8 (eight) hours as needed by mouth.   Yes [provider]  albuterol (PROAIR HFA) 108 (90 Base) MCG/ACT inhaler Inhale 2 puffs every 6 (six) hours as needed into the lungs. 07/20/14  Yes [provider]  allopurinol (ZYLOPRIM) 100 MG tablet TAKE 1 TABLET BY MOUTH ONCE EVERY EVENING 05/25/17  Yes [provider]  amiodarone (PACERONE) 200 MG tablet TAKE ONE-HALF (1/2) TABLET DAILY 08/25/16  Yes [provider]  apixaban (ELIQUIS) 5 MG TABS tablet TAKE 1 TABLET EVERY 12 HOURS 05/27/17  Yes [provider]  levothyroxine (SYNTHROID) 25 MCG tablet Take 1 tablet (25 mcg total) by mouth daily before breakfast. 08/20/17  Yes Hartwell Vandiver, Gwyndolyn Saxon, MD  lisinopril (PRINIVIL,ZESTRIL) 5 MG tablet Take 2.5 mg daily by mouth.   Yes [provider]  metoprolol tartrate (LOPRESSOR) 25 MG tablet Take 25 mg 2 (two) times daily by mouth.   Yes [provider]  rOPINIRole (REQUIP) 0.25 MG tablet TAKE 1 TABLET BY MOUTH EVERY EVENING FOR2 DAYS,THEN INCREASE TO 2 TABLETS EVERY EVENING FOR RESTLESS LEGS 02/10/17  Yes [provider]  tiotropium (SPIRIVA HANDIHALER) 18 MCG inhalation capsule INHALE THE CONTENTS OF 1 CAPSULE ONCE DAILY 06/08/17  Yes [provider]  torsemide (DEMADEX) 20 MG tablet Take 10 mg daily by mouth. 08/14/16  Yes [provider]  Vitamin D, Cholecalciferol,  1000 units CAPS Take 1 capsule by mouth daily. 08/20/17  Yes Sheriden Archibeque, Gwyndolyn Saxon, MD  spironolactone (ALDACTONE) 25 MG tablet Take 0.5 tablets (12.5 mg total) by mouth daily. 09/16/17   Messiah Rovira, Gwyndolyn Saxon, MD  tamsulosin (FLOMAX) 0.4 MG CAPS capsule Take 1 capsule (0.4 mg total) by mouth daily  after supper. 09/16/17   [name], MD    No Known Allergies  Past Surgical History:  Procedure Laterality Date  . CARDIAC DEFIBRILLATOR PLACEMENT  2015    Social History   Tobacco Use  . Smoking status: Former Research scientist (life sciences)  . Smokeless tobacco: Current User    Types: Chew  Substance Use Topics  . Alcohol use: Yes    Comment: 1 beer a month  . Drug use: No    Family History  Problem Relation Age of Onset  . Diabetes Mother   . CAD Mother   . Diabetes Father   . CAD Father     Medication list has been reviewed and updated.  Physical Examination: BP 124/62   Pulse 60   Ht 6\' 1"  (1.854 m)   Wt 215 lb (97.5 kg)   BMI 28.37 kg/m   Physical Exam  Constitutional: He appears well-developed and well-nourished.  Cardiovascular: Normal rate, regular rhythm and normal heart sounds.  Pulmonary/Chest: Effort normal and breath sounds normal.  Musculoskeletal:  Trace BLE edema  Neurological: He is alert.  Skin: Skin is warm and dry.  Psychiatric: He has a normal mood and affect. His behavior is normal.  Nursing note and vitals reviewed.   Assessment and Plan:  1. Congestive heart failure with cardiomyopathy ([place]) Compensated currently on BB/ACEI/diuretic, add low dose spironolactone  2. Paroxysmal A-fib (HCC) Stable on Eliquis/amiodarone, consider d/c amio  3. CKD (chronic kidney disease) stage 3, GFR 30-59 ml/min (HCC) Improved last visit, avoid NSAIDS  4. CLL (chronic lymphocytic leukemia) (HCC) Stable, followed by onco  5. COPD, mild ([place]) Well controlled on Spiriva, prn albuterol  6. Hypothyroidism, unspecified type On Synthroid - TSH  7. BPH associated with nocturia Trial Flomax after spironolactone  8. OSA on CPAP Continue CPAP  9. Chronic gout without tophus, unspecified cause, unspecified site Well controlled on allopurinol  10. RLS (restless legs syndrome) Well controlled on Requip  11. Vitamin D deficiency On supplement - Vitamin D (25  hydroxy)  12. Med review D/c Alka-Seltzer, use Robitussin instead  Return in about 4 weeks (around 10/14/2017).  Satira Anis. [facility]  09/17/2017

## 2017-09-16 NOTE — Patient Instructions (Addendum)
Begin spironolactone 12.5 mg daily. In two weeks begin tamsulosin 0.4 mg daily after evening meal.

## 2017-09-17 ENCOUNTER — Other Ambulatory Visit: Payer: Self-pay | Admitting: Family Medicine

## 2017-09-17 LAB — VITAMIN D 25 HYDROXY (VIT D DEFICIENCY, FRACTURES): Vit D, 25-Hydroxy: 27.4 ng/mL — ABNORMAL LOW (ref 30.0–100.0)

## 2017-09-17 LAB — TSH: TSH: 9.83 u[IU]/mL — AB (ref 0.450–4.500)

## 2017-09-17 MED ORDER — LEVOTHYROXINE SODIUM 50 MCG PO TABS: 50.0000 ug | ORAL_TABLET | Freq: Every day | ORAL | 2 refills | Status: DC

## 2017-09-17 MED ORDER — VITAMIN D (CHOLECALCIFEROL) 25 MCG (1000 UT) PO CAPS: 2.0000 | ORAL_CAPSULE | Freq: Every day | ORAL | Status: AC

## 2017-10-02 ENCOUNTER — Telehealth: Payer: Self-pay | Admitting: Family Medicine

## 2017-10-02 NOTE — Telephone Encounter (Signed)
Called pt to sched for AWV with Nurse Health Advisor.  C/b #  [phone] on Skype [email or web address] if you have questions ° °

## 2017-10-07 ENCOUNTER — Ambulatory Visit (INDEPENDENT_AMBULATORY_CARE_PROVIDER_SITE_OTHER): Payer: Medicare Other

## 2017-10-07 VITALS — BP 124/62 | HR 60 | Temp 98.1°F | Resp 12 | Ht 73.0 in | Wt 211.6 lb

## 2017-10-07 DIAGNOSIS — Z0000 Encounter for general adult medical examination without abnormal findings: Secondary | ICD-10-CM

## 2017-10-07 NOTE — Progress Notes (Signed)
VITALS:  B/P: 124/62, R arm, sitting, Lg cuff; T: 98.1, oral P: 60 R: 12

## 2017-10-07 NOTE — Progress Notes (Signed)
Subjective:   Clifford Herrera is a 77 y.o. male who presents for an Initial Medicare Annual Wellness Visit.  Review of Systems  N/A Cardiac Risk Factors include: sedentary lifestyle;hypertension;male gender;advanced age (>4men, >65 women);family history of premature cardiovascular disease    Objective:    Today's Vitals   10/07/17 1033  Weight: 211 lb 9.6 oz (96 kg)  Height: 6\' 1"  (1.854 m)   Body mass index is 27.92 kg/m.  Advanced Directives 10/07/2017 08/25/2017 08/08/2017 09/04/2016 07/03/2016 05/01/2016 02/28/2016  Does Patient Have a Medical Advance Directive? No;Yes Yes No Yes Yes Yes Yes  Type of Industrial/product designer of [place];Living will - Healthcare Power of [place]  Does patient want to make changes to medical advance directive? Yes (MAU/Ambulatory/Procedural Areas - Information given) No - Patient declined - - No - Patient declined - -  Copy of [place] in Chart? No - copy requested - - - No - copy requested - No - copy requested  Would patient like information on creating a medical advance directive? Yes (MAU/Ambulatory/Procedural Areas - Information given) - No - Patient declined - - - -    Current Medications (verified) Outpatient Encounter Medications as of 10/07/2017  Medication Sig  . albuterol (PROAIR HFA) 108 (90 Base) MCG/ACT inhaler Inhale 2 puffs every 6 (six) hours as needed into the lungs.  Marland Kitchen allopurinol (ZYLOPRIM) 100 MG tablet TAKE 1 TABLET BY MOUTH ONCE EVERY EVENING  . amiodarone (PACERONE) 200 MG tablet TAKE ONE-HALF (1/2) TABLET DAILY  . apixaban (ELIQUIS) 5 MG TABS tablet TAKE 1 TABLET EVERY 12 HOURS  . lisinopril (PRINIVIL,ZESTRIL) 5 MG tablet Take 2.5 mg daily by mouth.  . metoprolol tartrate (LOPRESSOR) 25 MG tablet Take 25 mg 2 (two) times daily by mouth.  Marland Kitchen rOPINIRole (REQUIP) 0.25 MG tablet TAKE 1 TABLET BY MOUTH EVERY  EVENING FOR2 DAYS,THEN INCREASE TO 2 TABLETS EVERY EVENING FOR RESTLESS LEGS  . tiotropium (SPIRIVA HANDIHALER) 18 MCG inhalation capsule INHALE THE CONTENTS OF 1 CAPSULE ONCE DAILY  . torsemide (DEMADEX) 20 MG tablet Take 10 mg by mouth daily.   . Vitamin D, Cholecalciferol, 1000 units CAPS Take 2 capsules by mouth daily.  . [DISCONTINUED] acetaminophen (TYLENOL) 500 MG tablet Take 1,000 mg every 8 (eight) hours as needed by mouth.  . [DISCONTINUED] levothyroxine (SYNTHROID, LEVOTHROID) 50 MCG tablet Take 1 tablet (50 mcg total) by mouth daily before breakfast.  . spironolactone (ALDACTONE) 25 MG tablet Take 0.5 tablets (12.5 mg total) by mouth daily.  . [DISCONTINUED] tamsulosin (FLOMAX) 0.4 MG CAPS capsule Take 1 capsule (0.4 mg total) by mouth daily after supper.   No facility-administered encounter medications on file as of 10/07/2017.     Allergies (verified) Patient has no known allergies.   History: Past Medical History:  Diagnosis Date  . CLL (chronic lymphocytic leukemia) ([place])   . CPAP (continuous positive airway pressure) dependence   . Heart failure ([place])   . HOH (hard of hearing)   . Presence of automatic (implantable) cardiac defibrillator   . Sleep apnea   . Upper respiratory infection    chronic   Past Surgical History:  Procedure Laterality Date  . CARDIAC DEFIBRILLATOR PLACEMENT  2015   Family History  Problem Relation Age of Onset  . Diabetes Mother   . CAD Mother   . Diabetes Father   . CAD Father    Social History  Socioeconomic History  . Marital status: Married    Spouse name: None  . Number of children: 3  . Years of education: None  . Highest education level: Bachelor's degree (e.g., BA, AB, BS)  Social Needs  . Financial resource strain: Not hard at all  . Food insecurity - worry: Never true  . Food insecurity - inability: Never true  . Transportation needs - medical: No  . Transportation needs - non-medical: No  Occupational History  .  Occupation: Retired  Tobacco Use  . Smoking status: Former Smoker    Packs/day: 2.00    Years: 42.00    Pack years: 84.00    Types: Cigarettes    Last attempt to quit: 1991    Years since quitting: 28.0  . Smokeless tobacco: Current User    Types: Chew  . Tobacco comment: smoking cessation materials not required  Substance and Sexual Activity  . Alcohol use: Yes    Comment: 6 beers per month  . Drug use: No  . Sexual activity: Not Currently    Comment: quit about 20 years ago  Other Topics Concern  . None  Social History Narrative  . None   Tobacco Counseling Ready to quit: No Counseling given: No Comment: smoking cessation materials not required   Clinical Intake:  Pre-visit preparation completed: Yes  Pain : No/denies pain     BMI - recorded: 27.92 Nutritional Status: BMI 25 -29 Overweight Nutritional Risks: Nausea/ vomitting/ diarrhea(diarrhea r/t stool softeners) Diabetes: No  How often do you need to have someone help you when you read instructions, pamphlets, or other written materials from your doctor or pharmacy?: 1 - Never  Interpreter Needed?: No  Information entered by :: AE[place], LPN  Activities of Daily Living In your present state of health, do you have any difficulty performing the following activities: 10/07/2017 08/17/2017  Hearing? Y N  Comment bilateral hearing aids -  Vision? N N  Comment wears eyeglasses -  Difficulty concentrating or making decisions? Y N  Comment short term memory loss -  Walking or climbing stairs? Y N  Comment fatigue -  Dressing or bathing? N N  Doing errands, shopping? N N  Preparing Food and eating ? N -  Comment partial upper dentures -  Using the Toilet? N -  In the past six months, have you accidently leaked urine? N -  Do you have problems with loss of bowel control? Y -  Comment diarrhea r/t stool softeners -  Managing your Medications? N -  Managing your Finances? N -  Housekeeping or managing your  Housekeeping? N -  Some recent data might be hidden     Immunizations and Health Maintenance Immunization History  Administered Date(s) Administered  . Influenza Split 09/30/2011  . Influenza, High Dose Seasonal PF 08/17/2017  . Influenza, Seasonal, Injecte, Preservative Fre 07/28/2014  . Influenza-Unspecified 06/27/2016  . Pneumococcal Conjugate-13 08/17/2017  . Pneumococcal Polysaccharide-23 10/20/2013  . Tdap 08/17/2017  . Zoster 12/22/2014   There are no preventive care reminders to display for this patient.  Patient Care Team: [name], MD as PCP - General (Family Medicine) Corey Skains, MD as Consulting Physician (Cardiology) Cammie Sickle, MD as Consulting Physician (Internal Medicine)  Indicate any recent Medical Services you may have received from other than [facility] providers in the past year (date may be approximate).    Assessment:   This is a routine wellness examination for [place].  Hearing/Vision screen Vision Screening Comments: Sees  Dr. Ellin Mayhew for annual eye exams  Dietary issues and exercise activities discussed: Current Exercise Habits: The patient does not participate in regular exercise at present  Goals    . DIET - INCREASE WATER INTAKE     Recommend to drink at least 6-8 8oz glasses of water per day       Depression Screen PHQ 2/9 Scores 10/07/2017 08/17/2017  PHQ - 2 Score 0 0    Fall Risk Fall Risk  10/07/2017 08/17/2017  Falls in the past year? Yes Yes  Comment chasing a kitten and fell off porch -  Number falls in past yr: 1 1  Injury with Fall? Yes Yes  Comment fractured R rib with R shoulder and groin pain -  Risk Factor Category  High Fall Risk High Fall Risk  Risk for fall due to : History of fall(s);Impaired vision;Other (Comment) -  Risk for fall due to: Comment wears eyeglasses; dizziness upon rising from a seated position -  Follow up Education provided;Falls prevention discussed Follow up appointment    Is the  patient's home free of loose throw rugs in walkways, pet beds, electrical cords, etc?   yes      Grab bars in the bathroom? yes Also has use of a shower chair      Handrails on the stairs?   yes only on exterior of home.      Adequate lighting?   yes  Denies use of a walker, cane or w/c. Also denies use of an elevated toilet seat.  Cognitive Function:     6CIT Screen 10/07/2017  What Year? 0 points  What month? 0 points  What time? 0 points  Count back from 20 0 points  Months in reverse 0 points  Repeat phrase 0 points  Total Score 0    Screening Tests Health Maintenance  Topic Date Due  . TETANUS/TDAP  08/18/2027  . INFLUENZA VACCINE  Completed  . PNA vac Low Risk Adult  Completed    Qualifies for Shingles Vaccine? Yes. Completed Zostavax 12/22/14. Due for Shingrix vaccine. Education has been provided regarding the importance of this vaccine. Pt has been advised to call her insurance company to determine her out of pocket expense. Advised he/she may also receive this vaccine at her local pharmacy or Health Dept. Verbalized acceptance and understanding.  Cancer Screenings: Colorectal: Previous colonoscopy performed in [place], [place]. Pt states report was normal and would no longer be required to have colon cancer screenings repeated. No report on file to reference.   Additional Screenings: Hepatitis B/HIV/Syphillis: does not qualify Hepatitis C Screening: does not qualify      Plan:   I have personally reviewed and addressed the Medicare Annual Wellness questionnaire and have noted the following in the patient's chart:  A. Medical and social history B. Use of alcohol, tobacco or illicit drugs  C. Current medications and supplements D. Functional ability and status E.  Nutritional status F.  Physical activity G. Advance directives H. List of other physicians I.  Hospitalizations, surgeries, and ER visits in previous 12 months J.  [place] such as hearing  and vision if needed, cognitive and depression L. Referrals and appointments - none  In addition, I have reviewed and discussed with patient certain preventive protocols, quality metrics, and best practice recommendations. A written personalized care plan for preventive services as well as general preventive health recommendations were provided to patient.  Signed,  Aleatha Borer, LPN Nurse Health Advisor  MD Recommendations:  Due for Shingrix vaccine. States he is on a wait list to receive this vaccine. Advised to provide a copy of his vaccination record once completed. Verbalized acceptance and understanding.

## 2017-10-07 NOTE — Patient Instructions (Signed)
Mr. Clifford Herrera , Thank you for taking time to come for your Medicare Wellness Visit. I appreciate your ongoing commitment to your health goals. Please review the following plan we discussed and let me know if I can assist you in the future.   Screening recommendations/referrals: Colonoscopy: Colon cancer screenings no longer required Recommended yearly ophthalmology/optometry visit for glaucoma screening and checkup Recommended yearly dental visit for hygiene and checkup  Vaccinations: Influenza vaccine: Up to date Pneumococcal vaccine: Completed series Tdap vaccine: Up to date Shingles vaccine: Up to date    Advanced directives: Advance directive discussed with you today. I have provided a copy for you to complete at home and have notarized. Once this is complete please bring a copy in to our office so we can scan it into your chart.  For [place] discussed with pt today. Pt has been provided with the appropriate documents to take home and complete. Pt has been advised to return the completed documents to the office for scanning purposes. Verbalized acceptance and understanding.  Conditions/risks identified: Recommend to drink at least 6-8 8oz glasses of water per day  Next appointment: You are scheduled to see Dr. Vicente Masson on 10/14/17 @ 10:00am.   Please schedule your Annual Wellness Visit with your Nurse Health Advisor in one year.  Preventive Care 25 Years and Older, Male Preventive care refers to lifestyle choices and visits with your health care provider that can promote health and wellness. What does preventive care include?  A yearly physical exam. This is also called an annual well check.  Dental exams once or twice a year.  Routine eye exams. Ask your health care provider how often you should have your eyes checked.  Personal lifestyle choices, including:  Daily care of your teeth and gums.  Regular physical activity.  Eating a healthy diet.  Avoiding  tobacco and drug use.  Limiting alcohol use.  Practicing safe sex.  Taking low doses of aspirin every day.  Taking vitamin and mineral supplements as recommended by your health care provider. What happens during an annual well check? The services and screenings done by your health care provider during your annual well check will depend on your age, overall health, lifestyle risk factors, and family history of disease. Counseling  Your health care provider may ask you questions about your:  Alcohol use.  Tobacco use.  Drug use.  Emotional well-being.  Home and relationship well-being.  Sexual activity.  Eating habits.  History of falls.  Memory and ability to understand (cognition).  Work and work Statistician. Screening  You may have the following tests or measurements:  Height, weight, and BMI.  Blood pressure.  Lipid and cholesterol levels. These may be checked every 5 years, or more frequently if you are over 26 years old.  Skin check.  Lung cancer screening. You may have this screening every year starting at age 46 if you have a 30-pack-year history of smoking and currently smoke or have quit within the past 15 years.  Fecal occult blood test (FOBT) of the stool. You may have this test every year starting at age 8.  Flexible sigmoidoscopy or colonoscopy. You may have a sigmoidoscopy every 5 years or a colonoscopy every 10 years starting at age 38.  Prostate cancer screening. Recommendations will vary depending on your family history and other risks.  Hepatitis C blood test.  Hepatitis B blood test.  Sexually transmitted disease (STD) testing.  Diabetes screening. This is done by checking your  blood sugar (glucose) after you have not eaten for a while (fasting). You may have this done every 1-3 years.  Abdominal aortic aneurysm (AAA) screening. You may need this if you are a current or former smoker.  Osteoporosis. You may be screened starting at age  37 if you are at high risk. Talk with your health care provider about your test results, treatment options, and if necessary, the need for more tests. Vaccines  Your health care provider may recommend certain vaccines, such as:  Influenza vaccine. This is recommended every year.  Tetanus, diphtheria, and acellular pertussis (Tdap, Td) vaccine. You may need a Td booster every 10 years.  Zoster vaccine. You may need this after age 11.  Pneumococcal 13-valent conjugate (PCV13) vaccine. One dose is recommended after age 56.  Pneumococcal polysaccharide (PPSV23) vaccine. One dose is recommended after age 34. Talk to your health care provider about which screenings and vaccines you need and how often you need them. This information is not intended to replace advice given to you by your health care provider. Make sure you discuss any questions you have with your health care provider. Document Released: 10/12/2015 Document Revised: 06/04/2016 Document Reviewed: 07/17/2015 Elsevier Interactive Patient Education  2017 [place] Prevention in the Home Falls can cause injuries. They can happen to people of all ages. There are many things you can do to make your home safe and to help prevent falls. What can I do on the outside of my home?  Regularly fix the edges of walkways and driveways and fix any cracks.  Remove anything that might make you trip as you walk through a door, such as a raised step or threshold.  Trim any bushes or trees on the path to your home.  Use bright outdoor lighting.  Clear any walking paths of anything that might make someone trip, such as rocks or tools.  Regularly check to see if handrails are loose or broken. Make sure that both sides of any steps have handrails.  Any raised decks and porches should have guardrails on the edges.  Have any leaves, snow, or ice cleared regularly.  Use sand or salt on walking paths during winter.  Clean up any spills  in your garage right away. This includes oil or grease spills. What can I do in the bathroom?  Use night lights.  Install grab bars by the toilet and in the tub and shower. Do not use towel bars as grab bars.  Use non-skid mats or decals in the tub or shower.  If you need to sit down in the shower, use a plastic, non-slip stool.  Keep the floor dry. Clean up any water that spills on the floor as soon as it happens.  Remove soap buildup in the tub or shower regularly.  Attach bath mats securely with double-sided non-slip rug tape.  Do not have throw rugs and other things on the floor that can make you trip. What can I do in the bedroom?  Use night lights.  Make sure that you have a light by your bed that is easy to reach.  Do not use any sheets or blankets that are too big for your bed. They should not hang down onto the floor.  Have a firm chair that has side arms. You can use this for support while you get dressed.  Do not have throw rugs and other things on the floor that can make you trip. What can I do in  the kitchen?  Clean up any spills right away.  Avoid walking on wet floors.  Keep items that you use a lot in easy-to-reach places.  If you need to reach something above you, use a strong step stool that has a grab bar.  Keep electrical cords out of the way.  Do not use floor polish or wax that makes floors slippery. If you must use wax, use non-skid floor wax.  Do not have throw rugs and other things on the floor that can make you trip. What can I do with my stairs?  Do not leave any items on the stairs.  Make sure that there are handrails on both sides of the stairs and use them. Fix handrails that are broken or loose. Make sure that handrails are as long as the stairways.  Check any carpeting to make sure that it is firmly attached to the stairs. Fix any carpet that is loose or worn.  Avoid having throw rugs at the top or bottom of the stairs. If you do  have throw rugs, attach them to the floor with carpet tape.  Make sure that you have a light switch at the top of the stairs and the bottom of the stairs. If you do not have them, ask someone to add them for you. What else can I do to help prevent falls?  Wear shoes that:  Do not have high heels.  Have rubber bottoms.  Are comfortable and fit you well.  Are closed at the toe. Do not wear sandals.  If you use a stepladder:  Make sure that it is fully opened. Do not climb a closed stepladder.  Make sure that both sides of the stepladder are locked into place.  Ask someone to hold it for you, if possible.  Clearly mark and make sure that you can see:  Any grab bars or handrails.  First and last steps.  Where the edge of each step is.  Use tools that help you move around (mobility aids) if they are needed. These include:  Canes.  Walkers.  Scooters.  Crutches.  Turn on the lights when you go into a dark area. Replace any light bulbs as soon as they burn out.  Set up your furniture so you have a clear path. Avoid moving your furniture around.  If any of your floors are uneven, fix them.  If there are any pets around you, be aware of where they are.  Review your medicines with your doctor. Some medicines can make you feel dizzy. This can increase your chance of falling. Ask your doctor what other things that you can do to help prevent falls. This information is not intended to replace advice given to you by your health care provider. Make sure you discuss any questions you have with your health care provider. Document Released: 07/12/2009 Document Revised: 02/21/2016 Document Reviewed: 10/20/2014 Elsevier Interactive Patient Education  2017 [employer].

## 2017-10-14 ENCOUNTER — Ambulatory Visit: Payer: Medicare Other | Admitting: Family Medicine

## 2017-10-14 ENCOUNTER — Encounter: Payer: Self-pay | Admitting: Family Medicine

## 2017-10-14 VITALS — BP 90/59 | HR 79 | Resp 16 | Ht 73.0 in | Wt 215.0 lb

## 2017-10-14 DIAGNOSIS — G4733 Obstructive sleep apnea (adult) (pediatric): Secondary | ICD-10-CM

## 2017-10-14 DIAGNOSIS — E039 Hypothyroidism, unspecified: Secondary | ICD-10-CM | POA: Diagnosis not present

## 2017-10-14 DIAGNOSIS — E559 Vitamin D deficiency, unspecified: Secondary | ICD-10-CM

## 2017-10-14 DIAGNOSIS — N183 Chronic kidney disease, stage 3 unspecified: Secondary | ICD-10-CM

## 2017-10-14 DIAGNOSIS — N400 Enlarged prostate without lower urinary tract symptoms: Secondary | ICD-10-CM

## 2017-10-14 DIAGNOSIS — G2581 Restless legs syndrome: Secondary | ICD-10-CM | POA: Diagnosis not present

## 2017-10-14 DIAGNOSIS — I480 Paroxysmal atrial fibrillation: Secondary | ICD-10-CM | POA: Diagnosis not present

## 2017-10-14 DIAGNOSIS — M1A9XX0 Chronic gout, unspecified, without tophus (tophi): Secondary | ICD-10-CM

## 2017-10-14 DIAGNOSIS — I429 Cardiomyopathy, unspecified: Secondary | ICD-10-CM

## 2017-10-14 DIAGNOSIS — I509 Heart failure, unspecified: Secondary | ICD-10-CM

## 2017-10-14 DIAGNOSIS — Z9989 Dependence on other enabling machines and devices: Secondary | ICD-10-CM

## 2017-10-15 LAB — BASIC METABOLIC PANEL
BUN/Creatinine Ratio: 15 (ref 10–24)
BUN: 19 mg/dL (ref 8–27)
CALCIUM: 8.6 mg/dL (ref 8.6–10.2)
CO2: 24 mmol/L (ref 20–29)
Chloride: 99 mmol/L (ref 96–106)
Creatinine, Ser: 1.3 mg/dL — ABNORMAL HIGH (ref 0.76–1.27)
GFR, EST AFRICAN AMERICAN: 61 mL/min/{1.73_m2} (ref >59)
GFR, EST NON AFRICAN AMERICAN: 53 mL/min/{1.73_m2} — AB (ref >59)
Glucose: 73 mg/dL (ref 65–99)
POTASSIUM: 5.4 mmol/L — AB (ref 3.5–5.2)
Sodium: 137 mmol/L (ref 134–144)

## 2017-10-15 LAB — VITAMIN D 25 HYDROXY (VIT D DEFICIENCY, FRACTURES): Vit D, 25-Hydroxy: 31.7 ng/mL (ref 30.0–100.0)

## 2017-10-15 LAB — TSH: TSH: 7.55 u[IU]/mL — ABNORMAL HIGH (ref 0.450–4.500)

## 2017-10-15 NOTE — Progress Notes (Signed)
Date:  10/14/2017   Name:  Clifford Herrera   DOB:  1941-05-30   MRN:  [id number]  PCP:  [name], MD    Chief Complaint: Congestive Heart Failure (BP low- having fatigue and dizzy spells. )   History of Present Illness:  This is a 77 y.o. male seen for one month f/u. Aldactone started last visit for CHF, having more fatigue and lightheadedness, occ with standing. Remains on Eliquis/amio for afib, followed by cards, CKD stable, seeing heme for CLL. Thyroid and vit D levels low, on increased dose both. BPH sxs improved on Flomax. OSA using CPAP nightly, RLS well controlled on Requip.  Review of Systems:  Review of Systems  Constitutional: Negative for chills and fever.  Respiratory: Negative for cough and shortness of breath.   Cardiovascular: Negative for chest pain.  Genitourinary: Negative for difficulty urinating.  Neurological: Negative for syncope.    Patient Active Problem List   Diagnosis Date Noted  . Vitamin D deficiency 09/16/2017  . Hypothyroidism 09/16/2017  . BPH associated with nocturia 08/20/2017  . Allergic rhinitis 08/17/2017  . Cellulitis 08/17/2017  . Cough 08/17/2017  . Dizziness 08/17/2017  . Impacted cerumen 08/17/2017  . Malaise and fatigue 08/17/2017  . Nonvenomous insect bite of multiple sites 08/17/2017  . Gait instability 08/17/2017  . Hypocalcemia 08/17/2017  . CKD (chronic kidney disease) stage 3, GFR 30-59 ml/min (HCC) 08/17/2017  . Gout 08/17/2017  . RLS (restless legs syndrome) 10/23/2016  . Generalized rash 09/30/2016  . 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 Prostate enlargement 09/04/2016  . Moderate mitral insufficiency 02/21/2015  . CAD (coronary artery disease) 09/18/2014  . Paroxysmal A-fib ([place]) 09/01/2014  . Opacity of lung on imaging study 07/20/2014  . 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 Mediastinal adenopathy 03/12/2014  . CLL (chronic lymphocytic  leukemia) ([place]) 02/07/2014  . COPD, mild ([place]) 02/07/2014  . OSA on CPAP 02/07/2014  . Polycythemia 02/07/2014  . DJD (degenerative joint disease) of hip 06/24/2011    Prior to Admission medications   Medication Sig Start Date End Date Taking? Authorizing Provider  albuterol (PROAIR HFA) 108 (90 Base) MCG/ACT inhaler Inhale 2 puffs every 6 (six) hours as needed into the lungs. 07/20/14  Yes [provider]  allopurinol (ZYLOPRIM) 100 MG tablet TAKE 1 TABLET BY MOUTH ONCE EVERY EVENING 05/25/17  Yes [provider]  amiodarone (PACERONE) 200 MG tablet TAKE ONE-HALF (1/2) TABLET DAILY 08/25/16  Yes [provider]  apixaban (ELIQUIS) 5 MG TABS tablet TAKE 1 TABLET EVERY 12 HOURS 05/27/17  Yes [provider]  lisinopril (PRINIVIL,ZESTRIL) 5 MG tablet Take 2.5 mg daily by mouth.   Yes [provider]  metoprolol tartrate (LOPRESSOR) 25 MG tablet Take 25 mg 2 (two) times daily by mouth.   Yes [provider]  rOPINIRole (REQUIP) 0.25 MG tablet TAKE 1 TABLET BY MOUTH EVERY EVENING FOR2 DAYS,THEN INCREASE TO 2 TABLETS EVERY EVENING FOR RESTLESS LEGS 02/10/17  Yes [provider]  spironolactone (ALDACTONE) 25 MG tablet Take 0.5 tablets (12.5 mg total) by mouth daily. 09/16/17  Yes [name], Gwyndolyn Saxon, MD  tiotropium (SPIRIVA HANDIHALER) 18 MCG inhalation capsule INHALE THE CONTENTS OF 1 CAPSULE ONCE DAILY 06/08/17  Yes [provider]  torsemide (DEMADEX) 20 MG tablet Take 10 mg by mouth daily.  08/14/16  Yes [provider]  Vitamin D, Cholecalciferol, 1000 units CAPS Take 2 capsules by mouth daily. 09/17/17  Yes  [name], MD    No Known Allergies  Past Surgical History:  Procedure Laterality Date  . CARDIAC DEFIBRILLATOR PLACEMENT  2015    Social History   Tobacco Use  . Smoking status: Former Smoker    Packs/day: 2.00    Years: 42.00    Pack years: 84.00    Types: Cigarettes    Last attempt to quit: 1991     Years since quitting: 28.0  . Smokeless tobacco: Current User    Types: Chew  . Tobacco comment: smoking cessation materials not required  Substance Use Topics  . Alcohol use: Yes    Comment: 6 beers per month  . Drug use: No    Family History  Problem Relation Age of Onset  . Diabetes Mother   . CAD Mother   . Diabetes Father   . CAD Father     Medication list has been reviewed and updated.  Physical Examination: BP (!) 90/59 (BP Location: Left Arm, Patient Position: Sitting, Cuff Size: Normal)   Pulse 79   Resp 16   Ht 6\' 1"  (1.854 m)   Wt 215 lb (97.5 kg)   SpO2 97%   BMI 28.37 kg/m   Physical Exam  Constitutional: He appears well-developed and well-nourished.  Cardiovascular: Normal rate, regular rhythm and normal heart sounds.  Pulmonary/Chest: Effort normal and breath sounds normal.  Musculoskeletal:  Trace BLE edema  Neurological: He is alert.  Skin: Skin is warm and dry.  Psychiatric: He has a normal mood and affect. His behavior is normal.  Nursing note and vitals reviewed.   Assessment and Plan:  1. 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 Paroxysmal A-fib ([place]) Well controlled on Eliquis/amiodarone, discuss possible d/c amiodarone with cards next visit  3. CKD (chronic kidney disease) stage 3, GFR 30-59 ml/min (HCC) - Basic Metabolic Panel (BMET)  4. Hypothyroidism, unspecified type New onset, on Synthroid, amiodarone may be affecting - TSH  5. OSA on CPAP Continue CPAP nightly  6. RLS (restless legs syndrome) Well controlled on Requip  7. Chronic gout without tophus, unspecified cause, unspecified site Well controlled on allopurinol  8. Prostate enlargement Sxs improved on Flomax  9. Vitamin D deficiency On increased supplement - Vitamin D (25 hydroxy)  Return in about 4 weeks (around 11/11/2017).  Satira Anis. [facility]  10/15/2017

## 2017-10-20 ENCOUNTER — Other Ambulatory Visit: Payer: Self-pay | Admitting: Family Medicine

## 2017-10-20 MED ORDER — TAMSULOSIN HCL 0.4 MG PO CAPS: 0.4000 mg | ORAL_CAPSULE | Freq: Every day | ORAL | 3 refills | Status: DC

## 2017-10-24 ENCOUNTER — Ambulatory Visit
Admission: EM | Admit: 2017-10-24 | Discharge: 2017-10-24 | Disposition: A | Discharge status: Home / Self Care | Payer: Medicare Other | Attending: Family Medicine | Admitting: Family Medicine

## 2017-10-24 ENCOUNTER — Other Ambulatory Visit: Payer: Self-pay

## 2017-10-24 DIAGNOSIS — L03114 Cellulitis of left upper limb: Secondary | ICD-10-CM

## 2017-10-24 MED ORDER — DOXYCYCLINE HYCLATE 100 MG PO CAPS: 100.0000 mg | ORAL_CAPSULE | Freq: Two times a day (BID) | ORAL | 0 refills | Status: DC

## 2017-10-24 NOTE — ED Triage Notes (Signed)
Patient states he picked up his puppy and he scratched his left arm about 2-3 days ago. Noticed his left arm was warm to the touch this morning.

## 2017-10-24 NOTE — ED Provider Notes (Signed)
[facility]    CSN: [id number] Arrival date & time: 10/24/17  1154  History   Chief Complaint Chief Complaint  Patient presents with  . Abrasion   HPI  77 year old male with an extensive past medical history presents with concern for cellulitis.  Patient states that he was picking his dog up to put him in the bed 2-3 days ago and suffered a scratch.  Since that time, he did not warmness and redness to the area.  Area is located on his forearm.  No drainage.  No fevers or chills.  He does endorse fatigue.  No medications or interventions tried.  No other reported symptoms at this time.  His pain is very minimal.  Past Medical History:  Diagnosis Date  . CLL (chronic lymphocytic leukemia) ([place])   . CPAP (continuous positive airway pressure) dependence   . Heart failure ([place])   . HOH (hard of hearing)   . Presence of automatic (implantable) cardiac defibrillator   . Sleep apnea   . Upper respiratory infection    chronic    Patient Active Problem List   Diagnosis Date Noted  . Vitamin D deficiency 09/16/2017  . Hypothyroidism 09/16/2017  . BPH associated with nocturia 08/20/2017  . Allergic rhinitis 08/17/2017  . Gait instability 08/17/2017  . Hypocalcemia 08/17/2017  . CKD (chronic kidney disease) stage 3, GFR 30-59 ml/min (HCC) 08/17/2017  . Gout 08/17/2017  . RLS (restless legs syndrome) 10/23/2016  . 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 Prostate enlargement 09/04/2016  . Moderate mitral insufficiency 02/21/2015  . CAD (coronary artery disease) 09/18/2014  . Paroxysmal A-fib ([place]) 09/01/2014  . 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 CLL (chronic lymphocytic leukemia) ([place]) 02/07/2014  . COPD, mild ([place]) 02/07/2014  . OSA on CPAP 02/07/2014  . Polycythemia 02/07/2014  . 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 CARDIAC  DEFIBRILLATOR PLACEMENT  2015   Home Medications    Prior to Admission medications   Medication Sig Start Date End Date Taking? Authorizing Provider  albuterol (PROAIR HFA) 108 (90 Base) MCG/ACT inhaler Inhale 2 puffs every 6 (six) hours as needed into the lungs. 07/20/14  Yes [provider]  allopurinol (ZYLOPRIM) 100 MG tablet TAKE 1 TABLET BY MOUTH ONCE EVERY EVENING 05/25/17  Yes [provider]  amiodarone (PACERONE) 200 MG tablet TAKE ONE-HALF (1/2) TABLET DAILY 08/25/16  Yes [provider]  apixaban (ELIQUIS) 5 MG TABS tablet TAKE 1 TABLET EVERY 12 HOURS 05/27/17  Yes [provider]  levothyroxine (SYNTHROID, LEVOTHROID) 50 MCG tablet Take 50 mcg by mouth daily before breakfast.   Yes [provider]  lisinopril (PRINIVIL,ZESTRIL) 5 MG tablet Take 2.5 mg daily by mouth.   Yes [provider]  metoprolol tartrate (LOPRESSOR) 25 MG tablet Take 25 mg 2 (two) times daily by mouth.   Yes [provider]  rOPINIRole (REQUIP) 0.25 MG tablet TAKE 1 TABLET BY MOUTH EVERY EVENING FOR2 DAYS,THEN INCREASE TO 2 TABLETS EVERY EVENING FOR RESTLESS LEGS 02/10/17  Yes [provider]  spironolactone (ALDACTONE) 25 MG tablet Take 0.5 tablets (12.5 mg total) by mouth daily. 09/16/17  Yes Plonk, Gwyndolyn Saxon, MD  tamsulosin (FLOMAX) 0.4 MG CAPS capsule Take 1 capsule (0.4 mg total) by mouth daily. 10/20/17  Yes Plonk, Gwyndolyn Saxon, MD  tiotropium (SPIRIVA HANDIHALER) 18 MCG inhalation capsule INHALE THE CONTENTS OF 1 CAPSULE ONCE  DAILY 06/08/17  Yes [provider]  torsemide (DEMADEX) 20 MG tablet Take 10 mg by mouth daily.  08/14/16  Yes [provider]  Vitamin D, Cholecalciferol, 1000 units CAPS Take 2 capsules by mouth daily. 09/17/17  Yes Plonk, Gwyndolyn Saxon, MD  doxycycline (VIBRAMYCIN) 100 MG capsule Take 1 capsule (100 mg total) by mouth 2 (two) times daily. 10/24/17   Coral Spikes, DO    Family History Family History    Problem Relation Age of Onset  . Diabetes Mother   . CAD Mother   . Diabetes Father   . CAD Father     Social History Social History   Tobacco Use  . Smoking status: Former Smoker    Packs/day: 2.00    Years: 42.00    Pack years: 84.00    Types: Cigarettes    Last attempt to quit: 1991    Years since quitting: 28.0  . Smokeless tobacco: Current User    Types: Chew  . Tobacco comment: smoking cessation materials not required  Substance Use Topics  . Alcohol use: Yes    Comment: 6 beers per month  . Drug use: No     Allergies   Patient has no known allergies.   Review of Systems Review of Systems  Constitutional: Positive for fatigue. Negative for fever.  Skin: Positive for wound.       Redness, warmth.   Physical Exam Triage Vital Signs ED Triage Vitals  Enc Vitals Group     BP 10/24/17 1215 (!) 95/48     Pulse Rate 10/24/17 1215 81     Resp --      Temp 10/24/17 1215 97.7 F (36.5 C)     Temp Source 10/24/17 1215 Oral     SpO2 10/24/17 1215 97 %     Weight 10/24/17 1216 210 lb (95.3 kg)     Height 10/24/17 1216 6\' 1"  (1.854 m)     Head Circumference --      Peak Flow --      Pain Score 10/24/17 1253 1     Pain Loc --      Pain Edu? --      Excl. in [place]? --    No data found.  Updated Vital Signs BP (!) 95/48 (BP Location: Left Arm)   Pulse 81   Temp 97.7 F (36.5 C) (Oral)   Ht 6\' 1"  (1.854 m)   Wt 210 lb (95.3 kg)   SpO2 97%   BMI 27.71 kg/m   Visual Acuity Right Eye Distance:   Left Eye Distance:   Bilateral Distance:    Right Eye Near:   Left Eye Near:    Bilateral Near:     Physical Exam  Constitutional: He is oriented to person, place, and time. He appears well-developed. No distress.  HENT:  Head: Normocephalic and atraumatic.  Eyes: Conjunctivae are normal.  Pulmonary/Chest: Effort normal. No respiratory distress.  Neurological: He is alert and oriented to person, place, and time.  Skin:  Left forearm -bruising noted.   Area of erythema and warmth noted.  No drainage.  No induration.  Psychiatric: He has a normal mood and affect. His behavior is normal.  Nursing note and vitals reviewed.  UC Treatments / Results  Labs (all labs ordered are listed, but only abnormal results are displayed) Labs Reviewed - No data to display  EKG  EKG Interpretation None       Radiology No results found.  Procedures Procedures (  including critical care time)  Medications Ordered in UC Medications - No data to display   Initial Impression / Assessment and Plan / UC Course  I have reviewed the triage vital signs and the nursing notes.  Pertinent labs & imaging results that were available during my care of the patient were reviewed by me and considered in my medical decision making (see chart for details).     77 year old male presents with cellulitis.  Treating with doxycycline.  Return precautions given.  Final Clinical Impressions(s) / UC Diagnoses   Final diagnoses:  Cellulitis of left upper extremity    ED Discharge Orders        Ordered    doxycycline (VIBRAMYCIN) 100 MG capsule  2 times daily     10/24/17 1250     Controlled Substance Prescriptions [place] Controlled Substance Registry consulted? Not Applicable   Coral Spikes, DO 10/24/17 1258

## 2017-11-06 ENCOUNTER — Encounter: Payer: Self-pay | Admitting: Family Medicine

## 2017-11-06 ENCOUNTER — Ambulatory Visit (INDEPENDENT_AMBULATORY_CARE_PROVIDER_SITE_OTHER): Payer: Medicare Other | Admitting: Family Medicine

## 2017-11-06 VITALS — BP 98/68 | HR 68 | Temp 98.0°F | Resp 16 | Ht 73.0 in | Wt 212.0 lb

## 2017-11-06 DIAGNOSIS — I480 Paroxysmal atrial fibrillation: Secondary | ICD-10-CM

## 2017-11-06 DIAGNOSIS — E039 Hypothyroidism, unspecified: Secondary | ICD-10-CM | POA: Diagnosis not present

## 2017-11-06 DIAGNOSIS — I429 Cardiomyopathy, unspecified: Secondary | ICD-10-CM | POA: Diagnosis not present

## 2017-11-06 DIAGNOSIS — J069 Acute upper respiratory infection, unspecified: Secondary | ICD-10-CM | POA: Diagnosis not present

## 2017-11-06 DIAGNOSIS — B9789 Other viral agents as the cause of diseases classified elsewhere: Secondary | ICD-10-CM

## 2017-11-06 DIAGNOSIS — I509 Heart failure, unspecified: Secondary | ICD-10-CM

## 2017-11-06 DIAGNOSIS — N183 Chronic kidney disease, stage 3 unspecified: Secondary | ICD-10-CM

## 2017-11-06 DIAGNOSIS — E559 Vitamin D deficiency, unspecified: Secondary | ICD-10-CM | POA: Diagnosis not present

## 2017-11-06 NOTE — Progress Notes (Signed)
Date:  11/06/2017   Name:  Clifford Herrera   DOB:  11-01-1940   MRN:  [id number]  PCP:  [name], MD    Chief Complaint: Cough (3 days and nasal drainage )   History of Present Illness:  This is a 77 y.o. male seen same day for 3d hx rhinorrhea, sl sore throat, sl NP cough, no fever or myalgias, sxs about the same. Saw cards two weeks ago, did not want to d/c amiodarone, no med changes made. Blood work last visit showed stable CKD3, nl vit D, improved TSH. Cellulitis LUE resolved.  Review of Systems:  Review of Systems  Constitutional: Negative for chills and fever.  Respiratory: Negative for shortness of breath.   Cardiovascular: Negative for chest pain.  Genitourinary: Negative for difficulty urinating.  Neurological: Negative for syncope and light-headedness.    Patient Active Problem List   Diagnosis Date Noted  . Vitamin D deficiency 09/16/2017  . Hypothyroidism 09/16/2017  . BPH associated with nocturia 08/20/2017  . Allergic rhinitis 08/17/2017  . Gait instability 08/17/2017  . Hypocalcemia 08/17/2017  . CKD (chronic kidney disease) stage 3, GFR 30-59 ml/min (HCC) 08/17/2017  . Gout 08/17/2017  . RLS (restless legs syndrome) 10/23/2016  . 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 Prostate enlargement 09/04/2016  . Moderate mitral insufficiency 02/21/2015  . CAD (coronary artery disease) 09/18/2014  . Paroxysmal A-fib ([place]) 09/01/2014  . 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 CLL (chronic lymphocytic leukemia) ([place]) 02/07/2014  . COPD, mild ([place]) 02/07/2014  . OSA on CPAP 02/07/2014  . Polycythemia 02/07/2014  . DJD (degenerative joint disease) of hip 06/24/2011    Prior to Admission medications   Medication Sig Start Date End Date Taking? Authorizing Provider  albuterol (PROAIR HFA) 108 (90 Base) MCG/ACT inhaler Inhale 2 puffs every 6 (six) hours as needed into the lungs. 07/20/14  Yes [provider]  allopurinol (ZYLOPRIM) 100 MG tablet TAKE 1 TABLET BY MOUTH ONCE EVERY EVENING 05/25/17  Yes [provider]  amiodarone (PACERONE) 200 MG tablet TAKE ONE-HALF (1/2) TABLET DAILY 08/25/16  Yes [provider]  apixaban (ELIQUIS) 5 MG TABS tablet TAKE 1 TABLET EVERY 12 HOURS 05/27/17  Yes [provider]  levothyroxine (SYNTHROID, LEVOTHROID) 50 MCG tablet Take 50 mcg by mouth daily before breakfast.   Yes [provider]  lisinopril (PRINIVIL,ZESTRIL) 5 MG tablet Take 2.5 mg daily by mouth.   Yes [provider]  metoprolol tartrate (LOPRESSOR) 25 MG tablet Take 25 mg 2 (two) times daily by mouth.   Yes [provider]  rOPINIRole (REQUIP) 0.25 MG tablet TAKE 1 TABLET BY MOUTH EVERY EVENING FOR2 DAYS,THEN INCREASE TO 2 TABLETS EVERY EVENING FOR RESTLESS LEGS 02/10/17  Yes [provider]  spironolactone (ALDACTONE) 25 MG tablet Take 0.5 tablets (12.5 mg total) by mouth daily. 09/16/17  Yes Leyanna Bittman, Gwyndolyn Saxon, MD  tamsulosin (FLOMAX) 0.4 MG CAPS capsule Take 1 capsule (0.4 mg total) by mouth daily. 10/20/17  Yes Phelix Fudala, Gwyndolyn Saxon, MD  tiotropium (SPIRIVA HANDIHALER) 18 MCG inhalation capsule INHALE THE CONTENTS OF 1 CAPSULE ONCE DAILY 06/08/17  Yes [provider]  torsemide (DEMADEX) 20 MG tablet Take 10 mg by mouth daily.  08/14/16  Yes [provider]  Vitamin D, Cholecalciferol, 1000 units CAPS Take 2 capsules by mouth daily. 09/17/17  Yes Jorian Willhoite, Gwyndolyn Saxon, MD    No Known Allergies  Past Surgical History:  Procedure Laterality Date  .  CARDIAC DEFIBRILLATOR PLACEMENT  2015    Social History   Tobacco Use  . Smoking status: Former Smoker    Packs/day: 2.00    Years: 42.00    Pack years: 84.00    Types: Cigarettes    Last attempt to quit: 1991    Years since quitting: 28.1  . Smokeless tobacco: Current User    Types: Chew  . Tobacco comment: smoking cessation materials not required  Substance Use  Topics  . Alcohol use: Yes    Comment: 6 beers per month  . Drug use: No    Family History  Problem Relation Age of Onset  . Diabetes Mother   . CAD Mother   . Diabetes Father   . CAD Father     Medication list has been reviewed and updated.  Physical Examination: BP 98/68   Pulse 68   Temp 98 F (36.7 C)   Resp 16   Ht 6\' 1"  (1.854 m)   Wt 212 lb (96.2 kg)   SpO2 97%   BMI 27.97 kg/m   Physical Exam  Constitutional: He appears well-developed and well-nourished. No distress.  HENT:  Right Ear: External ear normal.  Left Ear: External ear normal.  Nose: Nose normal.  Mouth/Throat: Oropharynx is clear and moist.  TMs clear  Neck: Neck supple.  Cardiovascular: Normal rate, regular rhythm and normal heart sounds.  Pulmonary/Chest: Effort normal and breath sounds normal.  Musculoskeletal: He exhibits no edema.  Lymphadenopathy:    He has no cervical adenopathy.  Neurological: He is alert.  Skin: Skin is warm and dry.  Psychiatric: He has a normal mood and affect. His behavior is normal.  Nursing note and vitals reviewed.   Assessment and Plan:  1. Viral URI with cough Call if sxs worsen/persist  2. 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 Paroxysmal A-fib (HCC) Stable on Eliquis/amiodarone, continue per cards  4. CKD (chronic kidney disease) stage 3, GFR 30-59 ml/min (HCC) Stable  5. Hypothyroidism, unspecified type On Synthroid - TSH  6. Vitamin D deficiency Well controlled on supplement  Return in about 4 weeks (around 12/04/2017).  Satira Anis. [facility]  11/06/2017

## 2017-11-06 NOTE — Patient Instructions (Signed)
Upper Respiratory Infection, Adult Most upper respiratory infections (URIs) are a viral infection of the air passages leading to the lungs. A URI affects the nose, throat, and upper air passages. The most common type of URI is nasopharyngitis and is typically referred to as "the common cold." URIs run their course and usually go away on their own. Most of the time, a URI does not require medical attention, but sometimes a bacterial infection in the upper airways can follow a viral infection. This is called a secondary infection. Sinus and middle ear infections are common types of secondary upper respiratory infections. Bacterial pneumonia can also complicate a URI. A URI can worsen asthma and chronic obstructive pulmonary disease (COPD). Sometimes, these complications can require emergency medical care and may be life threatening. What are the causes? Almost all URIs are caused by viruses. A virus is a type of germ and can spread from one person to another. What increases the risk? You may be at risk for a URI if:  You smoke.  You have chronic heart or lung disease.  You have a weakened defense (immune) system.  You are very young or very old.  You have nasal allergies or asthma.  You work in crowded or poorly ventilated areas.  You work in health care facilities or schools.  What are the signs or symptoms? Symptoms typically develop 2-3 days after you come in contact with a cold virus. Most viral URIs last 7-10 days. However, viral URIs from the influenza virus (flu virus) can last 14-18 days and are typically more severe. Symptoms may include:  Runny or stuffy (congested) nose.  Sneezing.  Cough.  Sore throat.  Headache.  Fatigue.  Fever.  Loss of appetite.  Pain in your forehead, behind your eyes, and over your cheekbones (sinus pain).  Muscle aches.  How is this diagnosed? Your health care provider may diagnose a URI by:  Physical exam.  Tests to check that your  symptoms are not due to another condition such as: ? Strep throat. ? Sinusitis. ? Pneumonia. ? Asthma.  How is this treated? A URI goes away on its own with time. It cannot be cured with medicines, but medicines may be prescribed or recommended to relieve symptoms. Medicines may help:  Reduce your fever.  Reduce your cough.  Relieve nasal congestion.  Follow these instructions at home:  Take medicines only as directed by your health care provider.  Gargle warm saltwater or take cough drops to comfort your throat as directed by your health care provider.  Use a warm mist humidifier or inhale steam from a shower to increase air moisture. This may make it easier to breathe.  Drink enough fluid to keep your urine clear or pale yellow.  Eat soups and other clear broths and maintain good nutrition.  Rest as needed.  Return to work when your temperature has returned to normal or as your health care provider advises. You may need to stay home longer to avoid infecting others. You can also use a face mask and careful hand washing to prevent spread of the virus.  Increase the usage of your inhaler if you have asthma.  Do not use any tobacco products, including cigarettes, chewing tobacco, or electronic cigarettes. If you need help quitting, ask your health care provider. How is this prevented? The best way to protect yourself from getting a cold is to practice good hygiene.  Avoid oral or hand contact with people with cold symptoms.  Wash your   hands often if contact occurs.  There is no clear evidence that vitamin C, vitamin E, echinacea, or exercise reduces the chance of developing a cold. However, it is always recommended to get plenty of rest, exercise, and practice good nutrition. Contact a health care provider if:  You are getting worse rather than better.  Your symptoms are not controlled by medicine.  You have chills.  You have worsening shortness of breath.  You have  brown or red mucus.  You have yellow or brown nasal discharge.  You have pain in your face, especially when you bend forward.  You have a fever.  You have swollen neck glands.  You have pain while swallowing.  You have white areas in the back of your throat. Get help right away if:  You have severe or persistent: ? Headache. ? Ear pain. ? Sinus pain. ? Chest pain.  You have chronic lung disease and any of the following: ? Wheezing. ? Prolonged cough. ? Coughing up blood. ? A change in your usual mucus.  You have a stiff neck.  You have changes in your: ? Vision. ? Hearing. ? Thinking. ? Mood. This information is not intended to replace advice given to you by your health care provider. Make sure you discuss any questions you have with your health care provider. Document Released: 03/11/2001 Document Revised: 05/18/2016 Document Reviewed: 12/21/2013 Elsevier Interactive Patient Education  2018 [employer] Inc.  

## 2017-11-07 LAB — TSH: TSH: 6.42 u[IU]/mL — AB (ref 0.450–4.500)

## 2017-11-09 ENCOUNTER — Other Ambulatory Visit: Payer: Self-pay | Admitting: Family Medicine

## 2017-11-09 MED ORDER — LEVOTHYROXINE SODIUM 75 MCG PO TABS: 75.0000 ug | ORAL_TABLET | Freq: Every day | ORAL | 2 refills | Status: DC

## 2017-11-11 ENCOUNTER — Encounter: Payer: Self-pay | Admitting: Family Medicine

## 2017-11-11 ENCOUNTER — Ambulatory Visit: Payer: Medicare Other | Admitting: Family Medicine

## 2017-11-11 VITALS — BP 132/82 | HR 78 | Temp 98.1°F | Resp 16 | Ht 73.0 in | Wt 210.0 lb

## 2017-11-11 DIAGNOSIS — E039 Hypothyroidism, unspecified: Secondary | ICD-10-CM | POA: Diagnosis not present

## 2017-11-11 DIAGNOSIS — I429 Cardiomyopathy, unspecified: Secondary | ICD-10-CM

## 2017-11-11 DIAGNOSIS — J189 Pneumonia, unspecified organism: Secondary | ICD-10-CM

## 2017-11-11 DIAGNOSIS — J181 Lobar pneumonia, unspecified organism: Secondary | ICD-10-CM | POA: Diagnosis not present

## 2017-11-11 DIAGNOSIS — I480 Paroxysmal atrial fibrillation: Secondary | ICD-10-CM | POA: Diagnosis not present

## 2017-11-11 DIAGNOSIS — C9190 Lymphoid leukemia, unspecified not having achieved remission: Secondary | ICD-10-CM | POA: Diagnosis not present

## 2017-11-11 DIAGNOSIS — C9110 Chronic lymphocytic leukemia of B-cell type not having achieved remission: Secondary | ICD-10-CM

## 2017-11-11 DIAGNOSIS — I509 Heart failure, unspecified: Secondary | ICD-10-CM

## 2017-11-11 MED ORDER — LEVOFLOXACIN 500 MG PO TABS: 500.0000 mg | ORAL_TABLET | Freq: Every day | ORAL | 0 refills | Status: DC

## 2017-11-11 NOTE — Patient Instructions (Signed)
Use Robitussin DM as needed for cough.

## 2017-11-12 NOTE — Progress Notes (Signed)
Date:  11/11/2017   Name:  Clifford Herrera   DOB:  1940/10/05   MRN:  [id number]  PCP:  [name], MD    Chief Complaint: Cough (No better )   History of Present Illness:  This is a 77 y.o. male seen in 5d f/u from visit for viral URI, cough worse, NP but with increased chest congestion and SOB, some lightheadedness on standing.   Review of Systems:  Review of Systems  Constitutional: Negative for chills and fever.  HENT: Negative for sinus pain and sore throat.   Cardiovascular: Negative for chest pain and leg swelling.  Gastrointestinal: Negative for abdominal pain, nausea and vomiting.  Genitourinary: Negative for difficulty urinating.  Neurological: Negative for syncope.    Patient Active Problem List   Diagnosis Date Noted  . Vitamin D deficiency 09/16/2017  . Hypothyroidism 09/16/2017  . BPH associated with nocturia 08/20/2017  . Allergic rhinitis 08/17/2017  . Gait instability 08/17/2017  . Hypocalcemia 08/17/2017  . CKD (chronic kidney disease) stage 3, GFR 30-59 ml/min (HCC) 08/17/2017  . Gout 08/17/2017  . RLS (restless legs syndrome) 10/23/2016  . 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 Prostate enlargement 09/04/2016  . Moderate mitral insufficiency 02/21/2015  . CAD (coronary artery disease) 09/18/2014  . Paroxysmal A-fib ([place]) 09/01/2014  . 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 CLL (chronic lymphocytic leukemia) ([place]) 02/07/2014  . COPD, mild ([place]) 02/07/2014  . OSA on CPAP 02/07/2014  . Polycythemia 02/07/2014  . DJD (degenerative joint disease) of hip 06/24/2011    Prior to Admission medications   Medication Sig Start Date End Date Taking? Authorizing Provider  albuterol (PROAIR HFA) 108 (90 Base) MCG/ACT inhaler Inhale 2 puffs every 6 (six) hours as needed into the lungs. 07/20/14  Yes [provider]  allopurinol (ZYLOPRIM) 100 MG tablet TAKE 1 TABLET BY MOUTH ONCE EVERY  EVENING 05/25/17  Yes [provider]  amiodarone (PACERONE) 200 MG tablet TAKE ONE-HALF (1/2) TABLET DAILY 08/25/16  Yes [provider]  apixaban (ELIQUIS) 5 MG TABS tablet TAKE 1 TABLET EVERY 12 HOURS 05/27/17  Yes [provider]  levothyroxine (SYNTHROID, LEVOTHROID) 75 MCG tablet Take 1 tablet (75 mcg total) by mouth daily before breakfast. 11/09/17  Yes Niyanna Asch, Gwyndolyn Saxon, MD  lisinopril (PRINIVIL,ZESTRIL) 5 MG tablet Take 2.5 mg daily by mouth.   Yes [provider]  metoprolol tartrate (LOPRESSOR) 25 MG tablet Take 25 mg 2 (two) times daily by mouth.   Yes [provider]  rOPINIRole (REQUIP) 0.25 MG tablet TAKE 1 TABLET BY MOUTH EVERY EVENING FOR2 DAYS,THEN INCREASE TO 2 TABLETS EVERY EVENING FOR RESTLESS LEGS 02/10/17  Yes [provider]  spironolactone (ALDACTONE) 25 MG tablet Take 0.5 tablets (12.5 mg total) by mouth daily. 09/16/17  Yes Jered Heiny, Gwyndolyn Saxon, MD  tamsulosin (FLOMAX) 0.4 MG CAPS capsule Take 1 capsule (0.4 mg total) by mouth daily. 10/20/17  Yes Shamon Lobo, Gwyndolyn Saxon, MD  tiotropium (SPIRIVA HANDIHALER) 18 MCG inhalation capsule INHALE THE CONTENTS OF 1 CAPSULE ONCE DAILY 06/08/17  Yes [provider]  torsemide (DEMADEX) 20 MG tablet Take 10 mg by mouth daily.  08/14/16  Yes [provider]  Vitamin D, Cholecalciferol, 1000 units CAPS Take 2 capsules by mouth daily. 09/17/17  Yes Cabella Kimm, Gwyndolyn Saxon, MD  levofloxacin (LEVAQUIN) 500 MG tablet Take 1 tablet (500 mg total) by mouth daily. 11/11/17   [name], MD    No Known Allergies  Past Surgical  History:  Procedure Laterality Date  . CARDIAC DEFIBRILLATOR PLACEMENT  2015    Social History   Tobacco Use  . Smoking status: Former Smoker    Packs/day: 2.00    Years: 42.00    Pack years: 84.00    Types: Cigarettes    Last attempt to quit: 1991    Years since quitting: 28.1  . Smokeless tobacco: Current User    Types: Chew  . Tobacco comment: smoking  cessation materials not required  Substance Use Topics  . Alcohol use: Yes    Comment: 6 beers per month  . Drug use: No    Family History  Problem Relation Age of Onset  . Diabetes Mother   . CAD Mother   . Diabetes Father   . CAD Father     Medication list has been reviewed and updated.  Physical Examination: BP 132/82   Pulse 78   Temp 98.1 F (36.7 C) (Oral)   Resp 16   Ht 6\' 1"  (1.854 m)   Wt 210 lb (95.3 kg)   SpO2 93%   BMI 27.71 kg/m   Physical Exam  Constitutional: He appears well-developed and well-nourished.  Cardiovascular: Normal rate, regular rhythm and normal heart sounds.  Pulmonary/Chest: Effort normal. He has no wheezes.  Rales R base  Musculoskeletal: He exhibits no edema.  Neurological: He is alert.  Skin: Skin is warm and dry.  Psychiatric: He has a normal mood and affect. His behavior is normal.  Nursing note and vitals reviewed.   Assessment and Plan:  1. Pneumonia of right lower lobe due to infectious organism (HCC) Levaquin 500 mg daily x 7d, Robitussin DM prn cough, encourage PO fluids, call if no better 48h  2. 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 Paroxysmal A-fib (HCC) Stable on BB/amiodarone/Eliquis, cards unwilling to stop amio  4. CLL (chronic lymphocytic leukemia) (HCC) Stable, followed by oncology  5. Hypothyroidism, unspecified type Improved on increased Synthroid, consider repeat TSH next visit  Return in about 4 weeks (around 12/09/2017), or if symptoms worsen or fail to improve.  Satira Anis. [facility]  11/12/2017

## 2017-11-16 DIAGNOSIS — Z95810 Presence of automatic (implantable) cardiac defibrillator: Secondary | ICD-10-CM | POA: Insufficient documentation

## 2017-12-04 ENCOUNTER — Ambulatory Visit: Payer: Medicare Other | Admitting: Family Medicine

## 2017-12-04 ENCOUNTER — Encounter: Payer: Self-pay | Admitting: Family Medicine

## 2017-12-04 VITALS — BP 98/68 | HR 70 | Resp 16 | Ht 73.0 in | Wt 214.0 lb

## 2017-12-04 DIAGNOSIS — G2581 Restless legs syndrome: Secondary | ICD-10-CM

## 2017-12-04 DIAGNOSIS — G4733 Obstructive sleep apnea (adult) (pediatric): Secondary | ICD-10-CM

## 2017-12-04 DIAGNOSIS — Z9989 Dependence on other enabling machines and devices: Secondary | ICD-10-CM

## 2017-12-04 DIAGNOSIS — I480 Paroxysmal atrial fibrillation: Secondary | ICD-10-CM | POA: Diagnosis not present

## 2017-12-04 DIAGNOSIS — J449 Chronic obstructive pulmonary disease, unspecified: Secondary | ICD-10-CM

## 2017-12-04 DIAGNOSIS — E559 Vitamin D deficiency, unspecified: Secondary | ICD-10-CM

## 2017-12-04 DIAGNOSIS — M1A9XX0 Chronic gout, unspecified, without tophus (tophi): Secondary | ICD-10-CM | POA: Diagnosis not present

## 2017-12-04 DIAGNOSIS — C9190 Lymphoid leukemia, unspecified not having achieved remission: Secondary | ICD-10-CM

## 2017-12-04 DIAGNOSIS — I509 Heart failure, unspecified: Secondary | ICD-10-CM

## 2017-12-04 DIAGNOSIS — E039 Hypothyroidism, unspecified: Secondary | ICD-10-CM | POA: Diagnosis not present

## 2017-12-04 DIAGNOSIS — C9110 Chronic lymphocytic leukemia of B-cell type not having achieved remission: Secondary | ICD-10-CM

## 2017-12-04 DIAGNOSIS — I429 Cardiomyopathy, unspecified: Secondary | ICD-10-CM

## 2017-12-04 DIAGNOSIS — N400 Enlarged prostate without lower urinary tract symptoms: Secondary | ICD-10-CM | POA: Diagnosis not present

## 2017-12-04 NOTE — Progress Notes (Signed)
Date:  12/04/2017   Name:  Clifford Herrera   DOB:  October 22, 1940   MRN:  [id number]  PCP:  [name], MD    Chief Complaint: Congestive Heart Failure   History of Present Illness:  This is a 77 y.o. male seen for one month f/u. Pneumonia sxs resolved. Saw cards last month, no med changes recommended. Still c/o fatigue, wonders if medication related. CLL followed by heme. On increased Synthroid. Nocturia x 2-3 on Flomax. Requip helping RLS. Using albuterol rarely.  Review of Systems:  Review of Systems  Constitutional: Negative for chills and fever.  Respiratory: Negative for cough and shortness of breath.   Cardiovascular: Negative for chest pain and leg swelling.  Genitourinary: Negative for difficulty urinating.  Neurological: Negative for syncope and light-headedness.    Patient Active Problem List   Diagnosis Date Noted  . Automatic implantable cardioverter-defibrillator in situ 11/16/2017  . Vitamin D deficiency 09/16/2017  . Hypothyroidism 09/16/2017  . BPH associated with nocturia 08/20/2017  . Allergic rhinitis 08/17/2017  . Gait instability 08/17/2017  . Hypocalcemia 08/17/2017  . CKD (chronic kidney disease) stage 3, GFR 30-59 ml/min (HCC) 08/17/2017  . Gout 08/17/2017  . RLS (restless legs syndrome) 10/23/2016  . 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 Prostate enlargement 09/04/2016  . Moderate mitral insufficiency 02/21/2015  . CAD (coronary artery disease) 09/18/2014  . Paroxysmal A-fib ([place]) 09/01/2014  . 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 CLL (chronic lymphocytic leukemia) ([place]) 02/07/2014  . COPD, mild ([place]) 02/07/2014  . OSA on CPAP 02/07/2014  . Polycythemia 02/07/2014  . DJD (degenerative joint disease) of hip 06/24/2011    Prior to Admission medications   Medication Sig Start Date End Date Taking? Authorizing Provider  albuterol (PROAIR HFA) 108 (90 Base) MCG/ACT inhaler Inhale 2  puffs every 6 (six) hours as needed into the lungs. 07/20/14  Yes [provider]  allopurinol (ZYLOPRIM) 100 MG tablet TAKE 1 TABLET BY MOUTH ONCE EVERY EVENING 05/25/17  Yes [provider]  apixaban (ELIQUIS) 5 MG TABS tablet TAKE 1 TABLET EVERY 12 HOURS 05/27/17  Yes [provider]  levothyroxine (SYNTHROID, LEVOTHROID) 75 MCG tablet Take 1 tablet (75 mcg total) by mouth daily before breakfast. 11/09/17  Yes Madoline Bhatt, Gwyndolyn Saxon, MD  lisinopril (PRINIVIL,ZESTRIL) 5 MG tablet Take 2.5 mg daily by mouth.   Yes [provider]  metoprolol tartrate (LOPRESSOR) 25 MG tablet Take 25 mg 2 (two) times daily by mouth.   Yes [provider]  rOPINIRole (REQUIP) 0.25 MG tablet TAKE 1 TABLET BY MOUTH EVERY EVENING FOR2 DAYS,THEN INCREASE TO 2 TABLETS EVERY EVENING FOR RESTLESS LEGS 02/10/17  Yes [provider]  spironolactone (ALDACTONE) 25 MG tablet Take 0.5 tablets (12.5 mg total) by mouth daily. 09/16/17  Yes Ozie Dimaria, Gwyndolyn Saxon, MD  tamsulosin (FLOMAX) 0.4 MG CAPS capsule Take 1 capsule (0.4 mg total) by mouth daily. 10/20/17  Yes Teka Chanda, Gwyndolyn Saxon, MD  tiotropium (SPIRIVA HANDIHALER) 18 MCG inhalation capsule INHALE THE CONTENTS OF 1 CAPSULE ONCE DAILY 06/08/17  Yes [provider]  torsemide (DEMADEX) 20 MG tablet Take 10 mg by mouth daily.  08/14/16  Yes [provider]  Vitamin D, Cholecalciferol, 1000 units CAPS Take 2 capsules by mouth daily. 09/17/17  Yes Haruna Rohlfs, Gwyndolyn Saxon, MD    No Known Allergies  Past Surgical History:  Procedure Laterality Date  . CARDIAC DEFIBRILLATOR PLACEMENT  2015    Social History   Tobacco Use  .  Smoking status: Former Smoker    Packs/day: 2.00    Years: 42.00    Pack years: 84.00    Types: Cigarettes    Last attempt to quit: 1991    Years since quitting: 28.2  . Smokeless tobacco: Current User    Types: Chew  . Tobacco comment: smoking cessation materials not required  Substance Use Topics  . Alcohol  use: Yes    Comment: 6 beers per month  . Drug use: No    Family History  Problem Relation Age of Onset  . Diabetes Mother   . CAD Mother   . Diabetes Father   . CAD Father     Medication list has been reviewed and updated.  Physical Examination: BP 98/68   Pulse 70   Resp 16   Ht 6\' 1"  (1.854 m)   Wt 214 lb (97.1 kg)   SpO2 98%   BMI 28.23 kg/m   Physical Exam  Constitutional: He appears well-developed and well-nourished.  Cardiovascular: Normal rate, regular rhythm and normal heart sounds.  Pulmonary/Chest: Effort normal and breath sounds normal.  Musculoskeletal: He exhibits no edema.  Neurological: He is alert.  Skin: Skin is warm and dry.  Psychiatric: His behavior is normal.  Nursing note and vitals reviewed.   Assessment and Plan:  1. Congestive heart failure with cardiomyopathy ([place]) Well compensated on current regimen - Basic Metabolic Panel (BMET)  2. Paroxysmal A-fib ([place]) Extensive discussion risks/benefits of amiodarone given thyroid/lung issues, will d/c, cont BB/Eliquis  3. COPD, mild ([place]) Stable on Spiriva/albuterol  4. CLL (chronic lymphocytic leukemia) (HCC) Stable, followed by heme/onc  5. OSA on CPAP Using CPAP nightly  6. Hypothyroidism, unspecified type On increased Synthroid - TSH  7. Prostate enlargement Sxs stable on Flomax  8. RLS (restless legs syndrome) Stable on Requip  9. Chronic gout without tophus, unspecified cause, unspecified site Well controlled on allopurinol  10. Vitamin D deficiency Well controlled on supplement  Return in about 3 months (around 03/06/2018).  Satira Anis. [facility]  12/04/2017

## 2017-12-04 NOTE — Patient Instructions (Addendum)
Stop amiodarone.  Amiodarone tablets What is this medicine? AMIODARONE (a MEE oh da rone) is an antiarrhythmic drug. It helps make your heart beat regularly. Because of the side effects caused by this medicine, it is only used when other medicines have not worked. It is usually used for heartbeat problems that may be life threatening. This medicine may be used for other purposes; ask your health care provider or pharmacist if you have questions. COMMON BRAND NAME(S): Cordarone, Pacerone What should I tell my health care provider before I take this medicine? They need to know if you have any of these conditions: -liver disease -lung disease -other heart problems -thyroid disease -an unusual or allergic reaction to amiodarone, iodine, other medicines, foods, dyes, or preservatives -pregnant or trying to get pregnant -breast-feeding How should I use this medicine? Take this medicine by mouth with a glass of water. Follow the directions on the prescription label. You can take this medicine with or without food. However, you should always take it the same way each time. Take your doses at regular intervals. Do not take your medicine more often than directed. Do not stop taking except on the advice of your doctor or health care professional. A special MedGuide will be given to you by the pharmacist with each prescription and refill. Be sure to read this information carefully each time. Talk to your pediatrician regarding the use of this medicine in children. Special care may be needed. Overdosage: If you think you have taken too much of this medicine contact a poison control center or emergency room at once. NOTE: This medicine is only for you. Do not share this medicine with others. What if I miss a dose? If you miss a dose, take it as soon as you can. If it is almost time for your next dose, take only that dose. Do not take double or extra doses. What may interact with this medicine? Do not take  this medicine with any of the following medications: -abarelix -apomorphine -arsenic trioxide -certain antibiotics like erythromycin, gemifloxacin, levofloxacin, pentamidine -certain medicines for depression like amoxapine, tricyclic antidepressants -certain medicines for fungal infections like fluconazole, itraconazole, ketoconazole, posaconazole, voriconazole -certain medicines for irregular heart beat like disopyramide, dofetilide, dronedarone, ibutilide, propafenone, sotalol -certain medicines for malaria like chloroquine, halofantrine -cisapride -droperidol -haloperidol -hawthorn -maprotiline -methadone -phenothiazines like chlorpromazine, mesoridazine, thioridazine -pimozide -ranolazine -red yeast rice -vardenafil -ziprasidone This medicine may also interact with the following medications: -antiviral medicines for HIV or AIDS -certain medicines for blood pressure, heart disease, irregular heart beat -certain medicines for cholesterol like atorvastatin, cerivastatin, lovastatin, simvastatin -certain medicines for hepatitis C like sofosbuvir and ledipasvir; sofosbuvir -certain medicines for seizures like phenytoin -certain medicines for thyroid problems -certain medicines that treat or prevent blood clots like warfarin -cholestyramine -cimetidine -clopidogrel -cyclosporine -dextromethorphan -diuretics -fentanyl -general anesthetics -grapefruit juice -lidocaine -loratadine -methotrexate -other medicines that prolong the QT interval (cause an abnormal heart rhythm) -procainamide -quinidine -rifabutin, rifampin, or rifapentine -St. John's Wort -trazodone This list may not describe all possible interactions. Give your health care provider a list of all the medicines, herbs, non-prescription drugs, or dietary supplements you use. Also tell them if you smoke, drink alcohol, or use illegal drugs. Some items may interact with your medicine. What should I watch for while  using this medicine? Your condition will be monitored closely when you first begin therapy. Often, this drug is first started in a hospital or other monitored health care setting. Once you are on maintenance therapy, visit  your doctor or health care professional for regular checks on your progress. Because your condition and use of this medicine carry some risk, it is a good idea to carry an identification card, necklace or bracelet with details of your condition, medications, and doctor or health care professional. Dennis Bast may get drowsy or dizzy. Do not drive, use machinery, or do anything that needs mental alertness until you know how this medicine affects you. Do not stand or sit up quickly, especially if you are an older patient. This reduces the risk of dizzy or fainting spells. This medicine can make you more sensitive to the sun. Keep out of the sun. If you cannot avoid being in the sun, wear protective clothing and use sunscreen. Do not use sun lamps or tanning beds/booths. You should have regular eye exams before and during treatment. Call your doctor if you have blurred vision, see halos, or your eyes become sensitive to light. Your eyes may get dry. It may be helpful to use a lubricating eye solution or artificial tears solution. If you are going to have surgery or a procedure that requires contrast dyes, tell your doctor or health care professional that you are taking this medicine. What side effects may I notice from receiving this medicine? Side effects that you should report to your doctor or health care professional as soon as possible: -allergic reactions like skin rash, itching or hives, swelling of the face, lips, or tongue -blue-gray coloring of the skin -blurred vision, seeing blue green halos, increased sensitivity of the eyes to light -breathing problems -chest pain -dark urine -fast, irregular heartbeat -feeling faint or light-headed -intolerance to heat or cold -nausea or  vomiting -pain and swelling of the scrotum -pain, tingling, numbness in feet, hands -redness, blistering, peeling or loosening of the skin, including inside the mouth -spitting up blood -stomach pain -sweating -unusual or uncontrolled movements of body -unusually weak or tired -weight gain or loss -yellowing of the eyes or skin Side effects that usually do not require medical attention (report to your doctor or health care professional if they continue or are bothersome): -change in sex drive or performance -constipation -dizziness -headache -loss of appetite -trouble sleeping This list may not describe all possible side effects. Call your doctor for medical advice about side effects. You may report side effects to FDA at 1-800-FDA-1088. Where should I keep my medicine? Keep out of the reach of children. Store at room temperature between 20 and 25 degrees C (68 and 77 degrees F). Protect from light. Keep container tightly closed. Throw away any unused medicine after the expiration date. NOTE: This sheet is a summary. It may not cover all possible information. If you have questions about this medicine, talk to your doctor, pharmacist, or health care provider.  2018 Elsevier/Gold Standard (2013-12-19 19:48:11)

## 2017-12-05 LAB — BASIC METABOLIC PANEL
BUN/Creatinine Ratio: 12 (ref 10–24)
BUN: 15 mg/dL (ref 8–27)
CALCIUM: 8.5 mg/dL — AB (ref 8.6–10.2)
CHLORIDE: 101 mmol/L (ref 96–106)
CO2: 23 mmol/L (ref 20–29)
Creatinine, Ser: 1.25 mg/dL (ref 0.76–1.27)
GFR calc non Af Amer: 56 mL/min/{1.73_m2} — ABNORMAL LOW (ref >59)
GFR, EST AFRICAN AMERICAN: 64 mL/min/{1.73_m2} (ref >59)
GLUCOSE: 88 mg/dL (ref 65–99)
Potassium: 5 mmol/L (ref 3.5–5.2)
Sodium: 137 mmol/L (ref 134–144)

## 2017-12-05 LAB — TSH: TSH: 6.25 u[IU]/mL — AB (ref 0.450–4.500)

## 2017-12-19 ENCOUNTER — Other Ambulatory Visit: Payer: Self-pay | Admitting: Family Medicine

## 2017-12-22 ENCOUNTER — Other Ambulatory Visit: Payer: Self-pay

## 2017-12-22 ENCOUNTER — Encounter: Payer: Self-pay | Admitting: Internal Medicine

## 2017-12-22 ENCOUNTER — Inpatient Hospital Stay: Payer: Medicare Other | Attending: Internal Medicine | Admitting: Internal Medicine

## 2017-12-22 ENCOUNTER — Inpatient Hospital Stay: Payer: Medicare Other

## 2017-12-22 VITALS — BP 102/54 | HR 73 | Temp 97.3°F | Resp 22 | Ht 73.0 in | Wt 214.3 lb

## 2017-12-22 DIAGNOSIS — R0989 Other specified symptoms and signs involving the circulatory and respiratory systems: Secondary | ICD-10-CM | POA: Insufficient documentation

## 2017-12-22 DIAGNOSIS — C9110 Chronic lymphocytic leukemia of B-cell type not having achieved remission: Secondary | ICD-10-CM

## 2017-12-22 DIAGNOSIS — D72829 Elevated white blood cell count, unspecified: Secondary | ICD-10-CM | POA: Diagnosis not present

## 2017-12-22 DIAGNOSIS — I4891 Unspecified atrial fibrillation: Secondary | ICD-10-CM | POA: Insufficient documentation

## 2017-12-22 DIAGNOSIS — C8303 Small cell B-cell lymphoma, intra-abdominal lymph nodes: Secondary | ICD-10-CM

## 2017-12-22 LAB — CBC WITH DIFFERENTIAL/PLATELET
BASOS ABS: 0 10*3/uL (ref 0–0.1)
BASOS PCT: 0 %
EOS PCT: 0 %
Eosinophils Absolute: 0 10*3/uL (ref 0–0.7)
HEMATOCRIT: 35.6 % — AB (ref 40.0–52.0)
Hemoglobin: 11.9 g/dL — ABNORMAL LOW (ref 13.0–18.0)
LYMPHS ABS: 196.2 10*3/uL — AB (ref 1.0–3.6)
Lymphocytes Relative: 92 %
MCH: 29.9 pg (ref 26.0–34.0)
MCHC: 33.6 g/dL (ref 32.0–36.0)
MCV: 89.2 fL (ref 80.0–100.0)
Monocytes Absolute: 4.3 10*3/uL — ABNORMAL HIGH (ref 0.2–1.0)
Monocytes Relative: 2 %
NEUTROS ABS: 12.8 10*3/uL — AB (ref 1.4–6.5)
Neutrophils Relative %: 6 %
PLATELETS: 79 10*3/uL — AB (ref 150–440)
RBC: 3.99 MIL/uL — ABNORMAL LOW (ref 4.40–5.90)
RDW: 14.6 % — AB (ref 11.5–14.5)
WBC: 213.3 10*3/uL (ref 3.8–10.6)

## 2017-12-22 LAB — COMPREHENSIVE METABOLIC PANEL
ALBUMIN: 4 g/dL (ref 3.5–5.0)
ALK PHOS: 70 U/L (ref 38–126)
ALT: 15 U/L — ABNORMAL LOW (ref 17–63)
AST: 28 U/L (ref 15–41)
Anion gap: 9 (ref 5–15)
BILIRUBIN TOTAL: 0.5 mg/dL (ref 0.3–1.2)
BUN: 22 mg/dL — AB (ref 6–20)
CO2: 23 mmol/L (ref 22–32)
Calcium: 8.2 mg/dL — ABNORMAL LOW (ref 8.9–10.3)
Chloride: 97 mmol/L — ABNORMAL LOW (ref 101–111)
Creatinine, Ser: 1.26 mg/dL — ABNORMAL HIGH (ref 0.61–1.24)
GFR calc Af Amer: >60 mL/min (ref >60)
GFR calc non Af Amer: 54 mL/min — ABNORMAL LOW (ref >60)
GLUCOSE: 123 mg/dL — AB (ref 65–99)
Potassium: 5 mmol/L (ref 3.5–5.1)
SODIUM: 129 mmol/L — AB (ref 135–145)
TOTAL PROTEIN: 6 g/dL — AB (ref 6.5–8.1)

## 2017-12-22 LAB — LACTATE DEHYDROGENASE: LDH: 137 U/L (ref 98–192)

## 2017-12-22 NOTE — Assessment & Plan Note (Addendum)
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 March 2018- PET scan shows massive splenomegaly/ mild mediastinal and RP/periportal adenopathy 1-2 cm in size; overall stable/improved imaging.  # Patient continues to be asymptomatic;  CBC shows white count of  213K [slowly trending up] /Hemoglobin 11/platelets 76 [stable;on eliquis- see discussion below]. Aug 2018- FISH panel- 80%- 13 q del.    #Given the rising white count/and "chest congestion"-question progression of disease in the chest.  Recommend a PET scan for further evaluation.  #Discussed that given the rising white count/and if he has worsening lymphadenopathy which is quite symptomatic-treatments would be recommended; my preference Gazyva; especially given his A. fib and heart issues.    # Hx of A.fib- on eliquis [70s]. Stable.  # follow up few days after the PET scan.   CC: Dr.Singh/ Dr.Kowalski.

## 2017-12-22 NOTE — Progress Notes (Signed)
[place] OFFICE PROGRESS NOTE  Patient Care Team: [name], MD as PCP - General (Family Medicine) Corey Skains, MD as Consulting Physician (Cardiology) Cammie Sickle, MD as Consulting Physician (Internal Medicine)   Oncology History   SUMMARY OF HEMATOLOGIC HISTORY:  # CHRONIC LYMPHOCYTIC LEUKEMIA/SLL- March 2017-CLL FISH- 95% OF NUCLEI POSITIVE FOR 13Q DELETION; March 2017- PET 1-2Cm LN/Splenomegaly. 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 NEGATIVE for CCND1/IGH, ATM, chromosome 12, and TP53  were normal.   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  In the interim patient has not been admitted to the hospital.   Patient continues to complain of increased frequency of urination-which he attributes to his diuretics.  He also complains of chest congestion; coughing with phlegm especially at nights.  No wheezing.  No  Hemoptysis.  Denies any significant difficulty/worsening shortness of breath.  He has had no falls. He continues to have easy bruising [patient is on Eliquis for A. Fib]. He denies spontaneous bleeding. Denies any nosebleeds or gum bleeding.  No weight loss/or night sweats.   REVIEW OF SYSTEMS:  A complete 10 point review of system is done which is negative except mentioned above/history of present illness.   PAST MEDICAL HISTORY :  Past Medical History:  Diagnosis Date  . CLL (chronic lymphocytic leukemia) ([place])   . CPAP (continuous positive airway pressure) dependence   . Heart failure ([place] [facility])   . HOH (hard of hearing)   . Presence of  automatic (implantable) cardiac defibrillator   . Sleep apnea   . Upper respiratory infection    chronic    PAST SURGICAL HISTORY :   Past Surgical History:  Procedure Laterality Date  . CARDIAC DEFIBRILLATOR PLACEMENT  2015    FAMILY HISTORY :   Family History  Problem Relation Age of Onset  . Diabetes Mother   . CAD Mother   . Diabetes Father   . CAD Father     SOCIAL HISTORY:   Social History   Tobacco Use  . Smoking status: Former Smoker    Packs/day: 2.00    Years: 42.00    Pack years: 84.00    Types: Cigarettes    Last attempt to quit: 1991    Years since quitting: 28.2  . Smokeless tobacco: Current User    Types: Chew  . Tobacco comment: smoking cessation materials not required  Substance Use Topics  . Alcohol use: Yes    Comment: 6 beers per month  . Drug use: No    ALLERGIES:  has No Known Allergies.  MEDICATIONS:  Current Outpatient Medications  Medication Sig Dispense Refill  . allopurinol (ZYLOPRIM) 100 MG tablet TAKE 1 TABLET BY MOUTH ONCE EVERY EVENING    . apixaban (ELIQUIS) 5 MG TABS tablet TAKE 1 TABLET EVERY 12 HOURS    . levothyroxine (SYNTHROID, LEVOTHROID) 75 MCG tablet Take 1 tablet (75 mcg total) by mouth daily before breakfast. 30 tablet 2  . lisinopril (PRINIVIL,ZESTRIL) 5 MG tablet Take 2.5 mg daily by mouth.    . metoprolol tartrate (LOPRESSOR) 25 MG tablet Take 25 mg  2 (two) times daily by mouth.    Marland Kitchen rOPINIRole (REQUIP) 0.25 MG tablet Take 2 tablets by mouth at bedtime.    Marland Kitchen spironolactone (ALDACTONE) 25 MG tablet Take 0.5 tablets (12.5 mg total) by mouth daily. 15 tablet 2  . tamsulosin (FLOMAX) 0.4 MG CAPS capsule Take 1 capsule (0.4 mg total) by mouth daily after supper. 90 capsule 3  . tiotropium (SPIRIVA HANDIHALER) 18 MCG inhalation capsule INHALE THE CONTENTS OF 1 CAPSULE ONCE DAILY    . torsemide (DEMADEX) 20 MG tablet Take 10 mg by mouth daily.     . Vitamin D, Cholecalciferol, 1000 units CAPS Take 2 capsules by mouth  daily. 60 capsule    No current facility-administered medications for this visit.     PHYSICAL EXAMINATION:   BP (!) 102/54 (BP Location: Left Arm, Patient Position: Sitting)   Pulse 73   Temp (!) 97.3 F (36.3 C) (Tympanic)   Resp (!) 22   Ht _0  (1.854 m)   Wt 214 lb 4.6 oz (97.2 kg)   BMI 28.27 kg/m   Filed Weights   12/22/17 1452  Weight: 214 lb 4.6 oz (97.2 kg)    GENERAL: Well-nourished well-developed; Alert, no distress and comfortable. He is Accompanied by family. Walking  By himself EYES: No pallor or icterus OROPHARYNX: no thrush or ulceration. NECK: supple, no masses felt LYMPH:  no palpable lymphadenopathy in the cervical or inguinal regions. Positive for lymphadenopathy in the left axilla. LUNGS: clear to auscultation and  No wheeze or crackles HEART/CVS: regular rate & rhythm and no murmurs; No lower extremity edema ABDOMEN:abdomen soft, non-tender and normal bowel sounds; positive for splenomegaly.  Musculoskeletal:no cyanosis of digits and no clubbing  PSYCH: alert & oriented x 3 with fluent speech NEURO: no focal motor/sensory deficits SKIN:  no rashes or significant lesions  LABORATORY DATA:  I have reviewed the data as listed    Component Value Date/Time   NA 129 (L) 12/22/2017 1406   NA 137 12/04/2017 1050   NA 136 07/25/2014 0414   K 5.0 12/22/2017 1406   K 5.6 (H) 07/25/2014 0414   CL 97 (L) 12/22/2017 1406   CL 101 07/25/2014 0414   CO2 23 12/22/2017 1406   CO2 24 07/25/2014 0414   GLUCOSE 123 (H) 12/22/2017 1406   GLUCOSE 143 (H) 07/25/2014 0414   BUN 22 (H) 12/22/2017 1406   BUN 15 12/04/2017 1050   BUN 33 (H) 07/25/2014 0414   CREATININE 1.26 (H) 12/22/2017 1406   CREATININE 1.34 (H) 01/15/2015 0956   CALCIUM 8.2 (L) 12/22/2017 1406   CALCIUM 8.3 (L) 07/25/2014 0414   PROT 6.0 (L) 12/22/2017 1406   PROT 5.8 (L) 08/17/2017 1551   PROT 6.4 07/24/2014 0937   ALBUMIN 4.0 12/22/2017 1406   ALBUMIN 4.3 08/17/2017 1551   ALBUMIN 4.0  07/24/2014 0937   AST 28 12/22/2017 1406   AST 31 07/24/2014 0937   ALT 15 (L) 12/22/2017 1406   ALT 23 07/24/2014 0937   ALKPHOS 70 12/22/2017 1406   ALKPHOS 53 07/24/2014 0937   BILITOT 0.5 12/22/2017 1406   BILITOT 0.4 08/17/2017 1551   BILITOT 1.1 (H) 07/24/2014 0937   GFRNONAA 54 (L) 12/22/2017 1406   GFRNONAA 52 (L) 01/15/2015 0956   GFRAA >60 12/22/2017 1406   GFRAA >60 01/15/2015 0956    No results found for: SPEP, UPEP  Lab Results  Component Value Date   WBC 213.3 (HH) 12/22/2017   NEUTROABS 12.8 (H) 12/22/2017  HGB 11.9 (L) 12/22/2017   HCT 35.6 (L) 12/22/2017   MCV 89.2 12/22/2017   PLT 79 (L) 12/22/2017      Chemistry      Component Value Date/Time   NA 129 (L) 12/22/2017 1406   NA 137 12/04/2017 1050   NA 136 07/25/2014 0414   K 5.0 12/22/2017 1406   K 5.6 (H) 07/25/2014 0414   CL 97 (L) 12/22/2017 1406   CL 101 07/25/2014 0414   CO2 23 12/22/2017 1406   CO2 24 07/25/2014 0414   BUN 22 (H) 12/22/2017 1406   BUN 15 12/04/2017 1050   BUN 33 (H) 07/25/2014 0414   CREATININE 1.26 (H) 12/22/2017 1406   CREATININE 1.34 (H) 01/15/2015 0956      Component Value Date/Time   CALCIUM 8.2 (L) 12/22/2017 1406   CALCIUM 8.3 (L) 07/25/2014 0414   ALKPHOS 70 12/22/2017 1406   ALKPHOS 53 07/24/2014 0937   AST 28 12/22/2017 1406   AST 31 07/24/2014 0937   ALT 15 (L) 12/22/2017 1406   ALT 23 07/24/2014 0937   BILITOT 0.5 12/22/2017 1406   BILITOT 0.4 08/17/2017 1551   BILITOT 1.1 (H) 07/24/2014 0937         ASSESSMENT & PLAN:   CLL (chronic lymphocytic leukemia) ([facility]) # 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 March 2018- PET scan shows massive splenomegaly/ mild mediastinal and RP/periportal adenopathy 1-2 cm in size; overall stable/improved imaging.  # Patient continues to be asymptomatic;  CBC shows white count of  213K [slowly trending up] /Hemoglobin 11/platelets 76 [stable;on  eliquis- see discussion below]. Aug 2018- FISH panel- 80%- 13 q del.    #Given the rising white count/and "chest congestion"-question progression of disease in the chest.  Recommend a PET scan for further evaluation.  #Discussed that given the rising white count/and if he has worsening lymphadenopathy which is quite symptomatic-treatments would be recommended; my preference Gazyva; especially given his A. fib and heart issues.    # Hx of A.fib- on eliquis [70s]. Stable.  # follow up few days after the PET scan.   CC: Dr.Singh/ Dr.Kowalski.      Cammie Sickle, MD 12/22/2017 4:11 PM

## 2017-12-25 ENCOUNTER — Other Ambulatory Visit: Payer: Medicare Other

## 2017-12-29 ENCOUNTER — Ambulatory Visit: Payer: Medicare Other | Admitting: Internal Medicine

## 2017-12-30 ENCOUNTER — Ambulatory Visit
Admission: RE | Admit: 2017-12-30 | Discharge: 2017-12-30 | Disposition: A | Discharge status: Home / Self Care | Payer: Medicare Other | Source: Ambulatory Visit | Attending: Internal Medicine | Admitting: Internal Medicine

## 2017-12-30 DIAGNOSIS — C8303 Small cell B-cell lymphoma, intra-abdominal lymph nodes: Secondary | ICD-10-CM | POA: Diagnosis not present

## 2017-12-30 DIAGNOSIS — R590 Localized enlarged lymph nodes: Secondary | ICD-10-CM | POA: Diagnosis not present

## 2017-12-30 DIAGNOSIS — R161 Splenomegaly, not elsewhere classified: Secondary | ICD-10-CM | POA: Insufficient documentation

## 2017-12-30 LAB — GLUCOSE, CAPILLARY: Glucose-Capillary: 95 mg/dL (ref 65–99)

## 2017-12-30 MED ORDER — FLUDEOXYGLUCOSE F - 18 (FDG) INJECTION
11.1000 | Freq: Once | INTRAVENOUS | Status: AC | PRN
  Administered 2017-12-30: 11.44 via INTRAVENOUS

## 2017-12-31 ENCOUNTER — Encounter: Payer: Self-pay | Admitting: Family Medicine

## 2017-12-31 NOTE — Telephone Encounter (Signed)
Patient mychart message.

## 2018-01-05 ENCOUNTER — Inpatient Hospital Stay: Payer: Medicare Other | Attending: Internal Medicine | Admitting: Internal Medicine

## 2018-01-05 VITALS — BP 99/55 | HR 70 | Temp 97.5°F | Resp 20 | Ht 73.0 in | Wt 211.6 lb

## 2018-01-05 DIAGNOSIS — Z7901 Long term (current) use of anticoagulants: Secondary | ICD-10-CM | POA: Insufficient documentation

## 2018-01-05 DIAGNOSIS — C9110 Chronic lymphocytic leukemia of B-cell type not having achieved remission: Secondary | ICD-10-CM | POA: Insufficient documentation

## 2018-01-05 DIAGNOSIS — D649 Anemia, unspecified: Secondary | ICD-10-CM | POA: Insufficient documentation

## 2018-01-05 DIAGNOSIS — I4891 Unspecified atrial fibrillation: Secondary | ICD-10-CM | POA: Insufficient documentation

## 2018-01-05 DIAGNOSIS — R161 Splenomegaly, not elsewhere classified: Secondary | ICD-10-CM | POA: Insufficient documentation

## 2018-01-05 DIAGNOSIS — R05 Cough: Secondary | ICD-10-CM | POA: Insufficient documentation

## 2018-01-05 DIAGNOSIS — D696 Thrombocytopenia, unspecified: Secondary | ICD-10-CM | POA: Insufficient documentation

## 2018-01-05 DIAGNOSIS — R599 Enlarged lymph nodes, unspecified: Secondary | ICD-10-CM | POA: Diagnosis not present

## 2018-01-05 NOTE — Assessment & Plan Note (Addendum)
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 March 2018- PET scan shows massive splenomegaly/; progressive cervical/axillary/mediastinal and RP/periportal adenopathy.  #Patient's labs -CBC shows white count of  213K [slowly trending up] /Hemoglobin 11/platelets 76 [stable;on eliquis- see discussion below]. Aug 2018- FISH panel- 80%- 13 q del.  However more recently patient has been more fatigued.  #I had a long discussion with the patient and family that given the fatigue/anemia thrombocytopenia/and the progressive findings of splenomegaly/lymphadenopathy noted on the PET scan-I think it is reasonable to proceed with treatment.  #Of all the options available I think [name] would be a good option given its limited treatment duration of 6 months; also non-cardiotoxic [given history of A. fib CHF tenuous cardiac function- I would like to avoid ibrutinib if possible].  #Also discussed that patient has a 90% chance of response from the treatment; and after finishing 6 cycles he might not need any further treatment for the next 3-4 years.  Understands treatments are palliative not curative.  #I discussed the potential side effects of infusion reactions; risk of infections; also need for premedication/antimicrobial prophylaxis.  #Chronic cough-no evidence of bronchitis or pneumonia noted on the PET scan.  Question lisinopril.  I have asked the patient to talk to cardiology regarding alternate medication.  #Borderline low blood pressures-asymptomatic /but systolic 50Y; defer to cardiology regarding managing blood pressures.  # Hx of A.fib- on eliquis [70s]; easy bruising.  Patient interested in taking Eliquis once a day; which I think is not a good idea given his A. Fib/risk of stroke.  Discussed that his platelets should improve with treatment; which would also help his easy bruising.   #Follow-up in approximately 1 month labs [[place]-check fish panel  hepatitis panel]/most likely Gazyva 1 week later.   # I reviewed the blood work- with the patient in detail; also reviewed the imaging independently [as summarized above]; and with the patient in detail.    CC: Drs.Singh/ Dr.Kowalski/[place].

## 2018-01-05 NOTE — Progress Notes (Signed)
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 NEGATIVE for CCND1/IGH, ATM, chromosome 12, and TP53  were normal.   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  Patient does complain of fatigue ongoing for the last many months; however this has been more prominent recently. Continues to complain of cough especially at nighttime.  Denies any phlegm.  Denies any wheezing.  No hemoptysis.  Denies any significant difficulty/worsening shortness of breath.  He has had no falls. He continues to have easy bruising [patient is on Eliquis for A. Fib]. He denies spontaneous bleeding. Denies any nosebleeds or gum bleeding.  No weight loss/or night sweats.   REVIEW OF SYSTEMS:  A complete 10 point review of system is done which is negative except mentioned above/history of present illness.   PAST MEDICAL HISTORY :  Past Medical History:  Diagnosis Date  . CLL (chronic lymphocytic leukemia) ([place])   . CPAP (continuous positive airway pressure) dependence   . Heart failure ([place])   . HOH (hard of hearing)   . Presence of automatic (implantable) cardiac defibrillator   . Sleep apnea    . Upper respiratory infection    chronic    PAST SURGICAL HISTORY :   Past Surgical History:  Procedure Laterality Date  . CARDIAC DEFIBRILLATOR PLACEMENT  2015    FAMILY HISTORY :   Family History  Problem Relation Age of Onset  . Diabetes Mother   . CAD Mother   . Diabetes Father   . CAD Father     SOCIAL HISTORY:   Social History   Tobacco Use  . Smoking status: Former Smoker    Packs/day: 2.00    Years: 42.00    Pack years: 84.00    Types: Cigarettes    Last attempt to quit: 1991    Years since quitting: 28.2  . Smokeless tobacco: Current User    Types: Chew  . Tobacco comment: smoking cessation materials not required  Substance Use Topics  . Alcohol use: Yes    Comment: 6 beers per month  . Drug use: No    ALLERGIES:  has No Known Allergies.  MEDICATIONS:  Current Outpatient Medications  Medication Sig Dispense Refill  . allopurinol (ZYLOPRIM) 100 MG tablet TAKE 1 TABLET BY MOUTH ONCE EVERY EVENING    . apixaban (ELIQUIS) 5 MG TABS tablet TAKE 1 TABLET EVERY 12 HOURS    . levothyroxine (SYNTHROID, LEVOTHROID) 75 MCG tablet Take 1 tablet (75 mcg total) by mouth daily before breakfast. 30 tablet 2  . lisinopril (PRINIVIL,ZESTRIL) 5 MG tablet Take 2.5 mg daily by mouth.    . metoprolol tartrate (LOPRESSOR) 25 MG tablet Take 25 mg 2 (two) times daily by mouth.    Marland Kitchen  rOPINIRole (REQUIP) 0.25 MG tablet Take 2 tablets by mouth at bedtime.    . torsemide (DEMADEX) 20 MG tablet Take 10 mg by mouth daily.     . Vitamin D, Cholecalciferol, 1000 units CAPS Take 2 capsules by mouth daily. 60 capsule   . spironolactone (ALDACTONE) 25 MG tablet Take 0.5 tablets (12.5 mg total) by mouth daily. 15 tablet 2  . tamsulosin (FLOMAX) 0.4 MG CAPS capsule Take 1 capsule (0.4 mg total) by mouth daily after supper. 90 capsule 3   No current facility-administered medications for this visit.     PHYSICAL EXAMINATION:   BP (!) 99/55 (BP Location: Left Arm, Patient Position:  Sitting)   Pulse 70   Temp (!) 97.5 F (36.4 C) (Tympanic)   Resp 20   Ht '6\' 1"'  (1.854 m)   Wt 211 lb 10.3 oz (96 kg)   BMI 27.92 kg/m   Filed Weights   01/05/18 1126  Weight: 211 lb 10.3 oz (96 kg)    GENERAL: Well-nourished well-developed; Alert, no distress and comfortable. He is Accompanied by family. Walking  By himself EYES: No pallor or icterus OROPHARYNX: no thrush or ulceration. NECK: supple, no masses felt LYMPH:  no palpable lymphadenopathy in the cervical or inguinal regions. Positive for lymphadenopathy in the left axilla. LUNGS: clear to auscultation and  No wheeze or crackles HEART/CVS: regular rate & rhythm and no murmurs; No lower extremity edema ABDOMEN:abdomen soft, non-tender and normal bowel sounds; positive for splenomegaly.  Musculoskeletal:no cyanosis of digits and no clubbing  PSYCH: alert & oriented x 3 with fluent speech NEURO: no focal motor/sensory deficits SKIN:  no rashes or significant lesions  LABORATORY DATA:  I have reviewed the data as listed    Component Value Date/Time   NA 129 (L) 12/22/2017 1406   NA 137 12/04/2017 1050   NA 136 07/25/2014 0414   K 5.0 12/22/2017 1406   K 5.6 (H) 07/25/2014 0414   CL 97 (L) 12/22/2017 1406   CL 101 07/25/2014 0414   CO2 23 12/22/2017 1406   CO2 24 07/25/2014 0414   GLUCOSE 123 (H) 12/22/2017 1406   GLUCOSE 143 (H) 07/25/2014 0414   BUN 22 (H) 12/22/2017 1406   BUN 15 12/04/2017 1050   BUN 33 (H) 07/25/2014 0414   CREATININE 1.26 (H) 12/22/2017 1406   CREATININE 1.34 (H) 01/15/2015 0956   CALCIUM 8.2 (L) 12/22/2017 1406   CALCIUM 8.3 (L) 07/25/2014 0414   PROT 6.0 (L) 12/22/2017 1406   PROT 5.8 (L) 08/17/2017 1551   PROT 6.4 07/24/2014 0937   ALBUMIN 4.0 12/22/2017 1406   ALBUMIN 4.3 08/17/2017 1551   ALBUMIN 4.0 07/24/2014 0937   AST 28 12/22/2017 1406   AST 31 07/24/2014 0937   ALT 15 (L) 12/22/2017 1406   ALT 23 07/24/2014 0937   ALKPHOS 70 12/22/2017 1406   ALKPHOS 53 07/24/2014  0937   BILITOT 0.5 12/22/2017 1406   BILITOT 0.4 08/17/2017 1551   BILITOT 1.1 (H) 07/24/2014 0937   GFRNONAA 54 (L) 12/22/2017 1406   GFRNONAA 52 (L) 01/15/2015 0956   GFRAA >60 12/22/2017 1406   GFRAA >60 01/15/2015 0956    No results found for: SPEP, UPEP  Lab Results  Component Value Date   WBC 213.3 (HH) 12/22/2017   NEUTROABS 12.8 (H) 12/22/2017   HGB 11.9 (L) 12/22/2017   HCT 35.6 (L) 12/22/2017   MCV 89.2 12/22/2017   PLT 79 (L) 12/22/2017      Chemistry  Component Value Date/Time   NA 129 (L) 12/22/2017 1406   NA 137 12/04/2017 1050   NA 136 07/25/2014 0414   K 5.0 12/22/2017 1406   K 5.6 (H) 07/25/2014 0414   CL 97 (L) 12/22/2017 1406   CL 101 07/25/2014 0414   CO2 23 12/22/2017 1406   CO2 24 07/25/2014 0414   BUN 22 (H) 12/22/2017 1406   BUN 15 12/04/2017 1050   BUN 33 (H) 07/25/2014 0414   CREATININE 1.26 (H) 12/22/2017 1406   CREATININE 1.34 (H) 01/15/2015 0956      Component Value Date/Time   CALCIUM 8.2 (L) 12/22/2017 1406   CALCIUM 8.3 (L) 07/25/2014 0414   ALKPHOS 70 12/22/2017 1406   ALKPHOS 53 07/24/2014 0937   AST 28 12/22/2017 1406   AST 31 07/24/2014 0937   ALT 15 (L) 12/22/2017 1406   ALT 23 07/24/2014 0937   BILITOT 0.5 12/22/2017 1406   BILITOT 0.4 08/17/2017 1551   BILITOT 1.1 (H) 07/24/2014 0937         ASSESSMENT & PLAN:   CLL (chronic lymphocytic leukemia) (HCC) # 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 March 2018- PET scan shows massive splenomegaly/; progressive cervical/axillary/mediastinal and RP/periportal adenopathy.  #Patient's labs -CBC shows white count of  213K [slowly trending up] /Hemoglobin 11/platelets 76 [stable;on eliquis- see discussion below]. Aug 2018- FISH panel- 80%- 13 q del.  However more recently patient has been more fatigued.  #I had a long discussion with the patient and family that given the fatigue/anemia thrombocytopenia/and the  progressive findings of splenomegaly/lymphadenopathy noted on the PET scan-I think it is reasonable to proceed with treatment.  #Of all the options available I think [name] would be a good option given its limited treatment duration of 6 months; also non-cardiotoxic [given history of A. fib CHF tenuous cardiac function- I would like to avoid ibrutinib if possible].  #Also discussed that patient has a 90% chance of response from the treatment; and after finishing 6 cycles he might not need any further treatment for the next 3-4 years.  Understands treatments are palliative not curative.  #I discussed the potential side effects of infusion reactions; risk of infections; also need for premedication/antimicrobial prophylaxis.  #Chronic cough-no evidence of bronchitis or pneumonia noted on the PET scan.  Question lisinopril.  I have asked the patient to talk to cardiology regarding alternate medication.  #Borderline low blood pressures-asymptomatic /but systolic 62V; defer to cardiology regarding managing blood pressures.  # Hx of A.fib- on eliquis [70s]; easy bruising.  Patient interested in taking Eliquis once a day; which I think is not a good idea given his A. Fib/risk of stroke.  Discussed that his platelets should improve with treatment; which would also help his easy bruising.   #Follow-up in approximately 1 month labs [[place]-check fish panel hepatitis panel]/most likely Gazyva 1 week later.   # I reviewed the blood work- with the patient in detail; also reviewed the imaging independently [as summarized above]; and with the patient in detail.    CC: Drs.Singh/ Dr.Kowalski/[place].      Cammie Sickle, MD 01/05/2018 1:19 PM

## 2018-01-16 ENCOUNTER — Other Ambulatory Visit: Payer: Self-pay | Admitting: Family Medicine

## 2018-02-02 ENCOUNTER — Inpatient Hospital Stay: Payer: Medicare Other | Attending: Internal Medicine | Admitting: Internal Medicine

## 2018-02-02 ENCOUNTER — Inpatient Hospital Stay: Payer: Medicare Other

## 2018-02-02 ENCOUNTER — Other Ambulatory Visit: Payer: Self-pay

## 2018-02-02 VITALS — BP 112/68 | HR 67 | Temp 96.3°F | Resp 18 | Wt 214.2 lb

## 2018-02-02 DIAGNOSIS — I4891 Unspecified atrial fibrillation: Secondary | ICD-10-CM

## 2018-02-02 DIAGNOSIS — R5383 Other fatigue: Secondary | ICD-10-CM | POA: Insufficient documentation

## 2018-02-02 DIAGNOSIS — C9110 Chronic lymphocytic leukemia of B-cell type not having achieved remission: Secondary | ICD-10-CM | POA: Diagnosis not present

## 2018-02-02 DIAGNOSIS — Z7901 Long term (current) use of anticoagulants: Secondary | ICD-10-CM

## 2018-02-02 DIAGNOSIS — R05 Cough: Secondary | ICD-10-CM | POA: Insufficient documentation

## 2018-02-02 DIAGNOSIS — D696 Thrombocytopenia, unspecified: Secondary | ICD-10-CM | POA: Diagnosis not present

## 2018-02-02 LAB — COMPREHENSIVE METABOLIC PANEL
ALT: 17 U/L (ref 17–63)
AST: 29 U/L (ref 15–41)
Albumin: 4.2 g/dL (ref 3.5–5.0)
Alkaline Phosphatase: 71 U/L (ref 38–126)
Anion gap: 10 (ref 5–15)
BUN: 22 mg/dL — ABNORMAL HIGH (ref 6–20)
CHLORIDE: 104 mmol/L (ref 101–111)
CO2: 23 mmol/L (ref 22–32)
CREATININE: 1.33 mg/dL — AB (ref 0.61–1.24)
Calcium: 8.6 mg/dL — ABNORMAL LOW (ref 8.9–10.3)
GFR calc Af Amer: 58 mL/min — ABNORMAL LOW (ref >60)
GFR, EST NON AFRICAN AMERICAN: 50 mL/min — AB (ref >60)
Glucose, Bld: 107 mg/dL — ABNORMAL HIGH (ref 65–99)
Potassium: 4.9 mmol/L (ref 3.5–5.1)
Sodium: 137 mmol/L (ref 135–145)
Total Bilirubin: 0.5 mg/dL (ref 0.3–1.2)
Total Protein: 6.4 g/dL — ABNORMAL LOW (ref 6.5–8.1)

## 2018-02-02 LAB — LACTATE DEHYDROGENASE: LDH: 150 U/L (ref 98–192)

## 2018-02-02 NOTE — Assessment & Plan Note (Addendum)
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  #Patient is fairly asymptomatic except for mild-mod thrombocytopenia [bruising on Eliquis]; mild to moderate fatigue.  Discussed regarding Gazyva again-patient and family reluctant.  I think it is reasonable to hold off any therapy at this time and continue surveillance as patient is not overtly symptomatic especially in the context of his cardiac comorbidities.   #Chronic cough improved since stopping lisinopril.  #History of A. fib on Eliquis [platelets 60s-70s]; easy bruising; continue Eliquis for now.  #Follow-up in 2 months/labs.  Patient to call us sooner if symptomatic.  # 25 minutes face-to-face with the patient discussing the above plan of care; more than 50% of time spent on prognosis/ natural history; counseling and coordination.   CC: Drs.Singh/ Dr.Kowalski/[place].

## 2018-02-02 NOTE — Progress Notes (Signed)
Here for follow up. Per pt doing well . 

## 2018-02-02 NOTE — Progress Notes (Signed)
[facility] OFFICE PROGRESS NOTE  Patient Care Team: Juline Patch, MD as PCP - General (Family Medicine) Corey Skains, MD as Consulting Physician (Cardiology) Cammie Sickle, MD as Consulting Physician (Internal Medicine)  Cancer Staging No matching staging information was found for the patient.   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 NEGATIVE for CCND1/IGH, ATM, chromosome 12, and TP53  were normal.   #CHF/CAD s/p Defib- on Eliquis  [Dr.Kowalski]; Orthostatic hypotension      CLL (chronic lymphocytic leukemia) ([place])   02/07/2014 Initial Diagnosis    Chronic lymphocytic leukemia (HCC)         INTERVAL HISTORY:  Clifford Herrera 77 y.o.  male pleasant patient [name] currently on surveillance is here for follow-up.  Patient was recently evaluated by cardiology-taken off lisinopril because of cough borderline low blood pressures.  Patient states appetite is good.  Denies any weight loss.  Denies any early satiety.  Denies any night sweats.  Complains of mild to moderate fatigue/which he thinks is manageable.  Denies any swelling in the legs.  Review of Systems  Constitutional: Positive for malaise/fatigue. Negative for chills, diaphoresis, fever and weight loss.  HENT: Negative for nosebleeds and sore throat.   Eyes: Negative for double vision.  Respiratory: Negative for cough, hemoptysis, sputum production, shortness of breath and wheezing.   Cardiovascular: Negative for chest pain, palpitations, orthopnea and leg swelling.  Gastrointestinal: Negative for abdominal pain, blood in stool, constipation, diarrhea, heartburn, melena, nausea and vomiting.  Genitourinary:  Negative for dysuria, frequency and urgency.  Musculoskeletal: Negative for back pain and joint pain.  Skin: Negative.  Negative for itching and rash.  Neurological: Negative for dizziness, tingling, focal weakness, weakness and headaches.  Endo/Heme/Allergies: Bruises/bleeds easily.  Psychiatric/Behavioral: Negative for depression. The patient is not nervous/anxious and does not have insomnia.       PAST MEDICAL HISTORY :  Past Medical History:  Diagnosis Date  . CLL (chronic lymphocytic leukemia) ([place])   . CPAP (continuous positive airway pressure) dependence   . Heart failure ([place])   . HOH (hard of hearing)   . Presence of automatic (implantable) cardiac defibrillator   . Sleep apnea   . Upper respiratory infection    chronic    PAST SURGICAL HISTORY :   Past Surgical History:  Procedure Laterality Date  . CARDIAC DEFIBRILLATOR PLACEMENT  2015    FAMILY HISTORY :   Family History  Problem Relation Age of Onset  . Diabetes Mother   . CAD Mother   . Diabetes Father   . CAD Father     SOCIAL HISTORY:   Social History   Tobacco Use  . Smoking status: Former Smoker    Packs/day: 2.00    Years: 42.00    Pack years: 84.00    Types: Cigarettes    Last attempt to quit: 1991    Years since quitting: 28.3  . Smokeless tobacco: Current User    Types: Chew  . Tobacco comment: smoking cessation materials not required  Substance Use Topics  . Alcohol use: Yes    Comment: 6 beers per month  . Drug use: No    ALLERGIES:  has No Known Allergies.  MEDICATIONS:  Current  Outpatient Medications  Medication Sig Dispense Refill  . allopurinol (ZYLOPRIM) 100 MG tablet TAKE 1 TABLET BY MOUTH ONCE EVERY EVENING    . apixaban (ELIQUIS) 5 MG TABS tablet TAKE 1 TABLET EVERY 12 HOURS    . levothyroxine (SYNTHROID, LEVOTHROID) 75 MCG tablet TAKE 1 TABLET BY MOUTH ONCE DAILY BEFOREBREAKFAST. 90 tablet 3  . metoprolol tartrate (LOPRESSOR) 25 MG tablet Take 25 mg 2 (two) times daily  by mouth.    Marland Kitchen rOPINIRole (REQUIP) 0.25 MG tablet Take 2 tablets by mouth at bedtime.    Marland Kitchen spironolactone (ALDACTONE) 25 MG tablet TAKE 1/2 TABLET BY MOUTH ONCE DAILY 45 tablet 3  . tamsulosin (FLOMAX) 0.4 MG CAPS capsule Take 1 capsule (0.4 mg total) by mouth daily after supper. 90 capsule 3  . torsemide (DEMADEX) 20 MG tablet Take 10 mg by mouth daily.     . Vitamin D, Cholecalciferol, 1000 units CAPS Take 2 capsules by mouth daily. 60 capsule   . lisinopril (PRINIVIL,ZESTRIL) 5 MG tablet Take 2.5 mg daily by mouth.     No current facility-administered medications for this visit.     PHYSICAL EXAMINATION: ECOG PERFORMANCE STATUS: 1 - Symptomatic but completely ambulatory  BP 112/68 (BP Location: Left Arm, Patient Position: Sitting)   Pulse 67   Temp (!) 96.3 F (35.7 C) (Tympanic)   Resp 18   Wt 214 lb 2.8 oz (97.1 kg)   BMI 28.26 kg/m   Filed Weights   02/02/18 0941  Weight: 214 lb 2.8 oz (97.1 kg)    GENERAL: Well-nourished well-developed; Alert, no distress and comfortable.  Accompanied by family.  EYES: no pallor or icterus OROPHARYNX: no thrush or ulceration; NECK: supple; no lymph nodes felt. LYMPH: Bilateral 1 to 2 cm axillary adenopathy. LUNGS: Decreased breath sounds auscultation bilaterally. No wheeze or crackles HEART/CVS: regular rate & rhythm and no murmurs; No lower extremity edema ABDOMEN:abdomen soft, non-tender and normal bowel sounds. No hepatomegaly; positive for splenomegaly. Musculoskeletal:no cyanosis of digits and no clubbing  PSYCH: alert & oriented x 3 with fluent speech NEURO: no focal motor/sensory deficits SKIN: Chronic bruising bilateral upper extremities.    LABORATORY DATA:  I have reviewed the data as listed    Component Value Date/Time   NA 137 02/02/2018 0914   NA 137 12/04/2017 1050   NA 136 07/25/2014 0414   K 4.9 02/02/2018 0914   K 5.6 (H) 07/25/2014 0414   CL 104 02/02/2018 0914   CL 101 07/25/2014 0414   CO2 23 02/02/2018  0914   CO2 24 07/25/2014 0414   GLUCOSE 107 (H) 02/02/2018 0914   GLUCOSE 143 (H) 07/25/2014 0414   BUN 22 (H) 02/02/2018 0914   BUN 15 12/04/2017 1050   BUN 33 (H) 07/25/2014 0414   CREATININE 1.33 (H) 02/02/2018 0914   CREATININE 1.34 (H) 01/15/2015 0956   CALCIUM 8.6 (L) 02/02/2018 0914   CALCIUM 8.3 (L) 07/25/2014 0414   PROT 6.4 (L) 02/02/2018 0914   PROT 5.8 (L) 08/17/2017 1551   PROT 6.4 07/24/2014 0937   ALBUMIN 4.2 02/02/2018 0914   ALBUMIN 4.3 08/17/2017 1551   ALBUMIN 4.0 07/24/2014 0937   AST 29 02/02/2018 0914   AST 31 07/24/2014 0937   ALT 17 02/02/2018 0914   ALT 23 07/24/2014 0937   ALKPHOS 71 02/02/2018 0914   ALKPHOS 53 07/24/2014 0937   BILITOT 0.5 02/02/2018 0914   BILITOT 0.4 08/17/2017 1551   BILITOT 1.1 (H) 07/24/2014 0937   GFRNONAA 50 (L)  02/02/2018 0914   GFRNONAA 52 (L) 01/15/2015 0956   GFRAA 58 (L) 02/02/2018 0914   GFRAA >60 01/15/2015 0956    No results found for: SPEP, UPEP  Lab Results  Component Value Date   WBC 191.7 (HH) 02/02/2018   NEUTROABS 7.7 (H) 02/02/2018   HGB 12.3 (L) 02/02/2018   HCT 37.5 (L) 02/02/2018   MCV 89.2 02/02/2018   PLT 65 (L) 02/02/2018      Chemistry      Component Value Date/Time   NA 137 02/02/2018 0914   NA 137 12/04/2017 1050   NA 136 07/25/2014 0414   K 4.9 02/02/2018 0914   K 5.6 (H) 07/25/2014 0414   CL 104 02/02/2018 0914   CL 101 07/25/2014 0414   CO2 23 02/02/2018 0914   CO2 24 07/25/2014 0414   BUN 22 (H) 02/02/2018 0914   BUN 15 12/04/2017 1050   BUN 33 (H) 07/25/2014 0414   CREATININE 1.33 (H) 02/02/2018 0914   CREATININE 1.34 (H) 01/15/2015 0956      Component Value Date/Time   CALCIUM 8.6 (L) 02/02/2018 0914   CALCIUM 8.3 (L) 07/25/2014 0414   ALKPHOS 71 02/02/2018 0914   ALKPHOS 53 07/24/2014 0937   AST 29 02/02/2018 0914   AST 31 07/24/2014 0937   ALT 17 02/02/2018 0914   ALT 23 07/24/2014 0937   BILITOT 0.5 02/02/2018 0914   BILITOT 0.4 08/17/2017 1551   BILITOT 1.1  (H) 07/24/2014 8250       RADIOGRAPHIC STUDIES: I have personally reviewed the radiological images as listed and agreed with the findings in the report. No results found.   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  #Patient is fairly asymptomatic except for mild-mod thrombocytopenia [bruising on Eliquis]; mild to moderate fatigue.  Discussed regarding Gazyva again-patient and family reluctant.  I think it is reasonable to hold off any therapy at this time and continue surveillance as patient is not overtly symptomatic especially in the context of his cardiac comorbidities.   #Chronic cough improved since stopping lisinopril.  #History of A. fib on Eliquis [platelets 60s-70s]; easy bruising; continue Eliquis for now.  #Follow-up in 2 months/labs.  Patient to call us sooner if symptomatic.  # 25 minutes face-to-face with the patient discussing the above plan of care; more than 50% of time spent on prognosis/ natural history; counseling and coordination.   CC: Drs.Singh/ Dr.Kowalski/[place].    Orders Placed This Encounter  Procedures  . CBC with Differential/Platelet    Standing Status:   Future    Standing Expiration Date:   02/03/2019  . Comprehensive metabolic panel    Standing Status:   Future    Standing Expiration Date:   02/03/2019  . Lactate dehydrogenase    Standing Status:   Future    Standing Expiration Date:   02/03/2019   All questions were answered. The patient knows to call the clinic with any problems, questions or concerns.      Cammie Sickle, MD 02/02/2018 1:08 PM

## 2018-02-03 LAB — HEPATITIS B CORE ANTIBODY, IGM: Hep B C IgM: NEGATIVE

## 2018-02-03 LAB — CBC WITH DIFFERENTIAL/PLATELET
BASOS ABS: 0 10*3/uL (ref 0–0.1)
BASOS PCT: 0 %
Band Neutrophils: 0 %
Blasts: 0 %
EOS PCT: 0 %
Eosinophils Absolute: 0 10*3/uL (ref 0–0.7)
HCT: 37.5 % — ABNORMAL LOW (ref 40.0–52.0)
HEMOGLOBIN: 12.3 g/dL — AB (ref 13.0–18.0)
LYMPHS ABS: 178.2 10*3/uL — AB (ref 1.0–3.6)
Lymphocytes Relative: 93 %
MCH: 29.3 pg (ref 26.0–34.0)
MCHC: 32.9 g/dL (ref 32.0–36.0)
MCV: 89.2 fL (ref 80.0–100.0)
METAMYELOCYTES PCT: 0 %
MONO ABS: 5.8 10*3/uL — AB (ref 0.2–1.0)
MYELOCYTES: 0 %
Monocytes Relative: 3 %
Neutro Abs: 7.7 10*3/uL — ABNORMAL HIGH (ref 1.4–6.5)
Neutrophils Relative %: 4 %
Other: 0 %
Platelets: 65 10*3/uL — ABNORMAL LOW (ref 150–440)
Promyelocytes Relative: 0 %
RBC: 4.21 MIL/uL — ABNORMAL LOW (ref 4.40–5.90)
RDW: 15.1 % — ABNORMAL HIGH (ref 11.5–14.5)
WBC: 191.7 10*3/uL (ref 3.8–10.6)
nRBC: 0 /100 WBC

## 2018-02-03 LAB — HEPATITIS B SURFACE ANTIGEN: HEP B S AG: NEGATIVE

## 2018-02-15 LAB — FISH HES LEUKEMIA, 4Q12 REA

## 2018-03-09 ENCOUNTER — Ambulatory Visit: Payer: Medicare Other | Admitting: Family Medicine

## 2018-03-09 ENCOUNTER — Ambulatory Visit: Payer: Medicare Other | Admitting: Internal Medicine

## 2018-03-09 ENCOUNTER — Encounter: Payer: Self-pay | Admitting: Internal Medicine

## 2018-03-09 VITALS — BP 106/40 | HR 50 | Temp 97.5°F | Resp 16 | Ht 73.0 in | Wt 216.0 lb

## 2018-03-09 DIAGNOSIS — J40 Bronchitis, not specified as acute or chronic: Secondary | ICD-10-CM

## 2018-03-09 DIAGNOSIS — C9190 Lymphoid leukemia, unspecified not having achieved remission: Secondary | ICD-10-CM | POA: Diagnosis not present

## 2018-03-09 DIAGNOSIS — N183 Chronic kidney disease, stage 3 unspecified: Secondary | ICD-10-CM

## 2018-03-09 DIAGNOSIS — I429 Cardiomyopathy, unspecified: Secondary | ICD-10-CM | POA: Diagnosis not present

## 2018-03-09 DIAGNOSIS — I509 Heart failure, unspecified: Secondary | ICD-10-CM | POA: Diagnosis not present

## 2018-03-09 DIAGNOSIS — J449 Chronic obstructive pulmonary disease, unspecified: Secondary | ICD-10-CM

## 2018-03-09 DIAGNOSIS — Z9989 Dependence on other enabling machines and devices: Secondary | ICD-10-CM | POA: Diagnosis not present

## 2018-03-09 DIAGNOSIS — G4733 Obstructive sleep apnea (adult) (pediatric): Secondary | ICD-10-CM

## 2018-03-09 DIAGNOSIS — C9110 Chronic lymphocytic leukemia of B-cell type not having achieved remission: Secondary | ICD-10-CM

## 2018-03-09 MED ORDER — GUAIFENESIN-CODEINE 100-10 MG/5ML PO SYRP: 5.0000 mL | ORAL_SOLUTION | Freq: Three times a day (TID) | ORAL | 0 refills | Status: DC | PRN

## 2018-03-09 MED ORDER — AZITHROMYCIN 250 MG PO TABS: ORAL_TABLET | ORAL | 0 refills | Status: AC

## 2018-03-09 NOTE — Progress Notes (Signed)
Date:  03/09/2018   Name:  Clifford Herrera   DOB:  November 15, 1940   MRN:  [id number]   Chief Complaint: Congestive Heart Failure; Leukemia (CLL); and Sinus Problem (X 3 days )  Congestive Heart Failure  Presents for follow-up visit. Associated symptoms include fatigue and shortness of breath. Pertinent negatives include no abdominal pain, chest pain, edema, orthopnea or palpitations. The symptoms have been stable (EF 25%. he had an AICD). Compliance with medications is 76-100%.  Cough  This is a new problem. The current episode started in the past 7 days. The problem has been unchanged. The problem occurs hourly. The cough is non-productive. Associated symptoms include postnasal drip, a sore throat and shortness of breath. Pertinent negatives include no chest pain, chills, ear pain, fever, headaches, rash or wheezing. He has tried OTC cough suppressant for the symptoms. The treatment provided mild relief. There is no history of environmental allergies.  CLL - he is followed by Oncology - last WBC 171k.  Currently on no treatment but does have chronic fatigue and low energy.   Parox AFib - on eliquis anticoagulation and metoprolol for rate control.  He is not aware of any persistent episodes.  He denies significant bleeding. Tobacco - quit smoking 25 years ago.  No residual sx attributed to COPD.  He does not have wheezing or use any COPD medications. He has been seen by Dr. Raul Del who diagnosed mild COPD and OSA. Hypothyroidism - on supplementation.  Last TSH normal.  Lab Results  Component Value Date   TSH 6.250 (H) 12/04/2017   Lab Results  Component Value Date   CREATININE 1.33 (H) 02/02/2018   BUN 22 (H) 02/02/2018   NA 137 02/02/2018   K 4.9 02/02/2018   CL 104 02/02/2018   CO2 23 02/02/2018   Lab Results  Component Value Date   WBC 191.7 (HH) 02/02/2018   HGB 12.3 (L) 02/02/2018   HCT 37.5 (L) 02/02/2018   MCV 89.2 02/02/2018   PLT 65 (L) 02/02/2018    Review of Systems    Constitutional: Positive for fatigue. Negative for chills and fever.  HENT: Positive for postnasal drip and sore throat. Negative for ear pain.   Respiratory: Positive for cough and shortness of breath. Negative for chest tightness and wheezing.   Cardiovascular: Negative for chest pain, palpitations and leg swelling.  Gastrointestinal: Negative for abdominal pain.  Genitourinary: Negative for difficulty urinating and hematuria.  Musculoskeletal: Negative for arthralgias.  Skin: Positive for wound (small skin tears on forearms). Negative for rash.  Allergic/Immunologic: Negative for environmental allergies.  Neurological: Negative for dizziness and headaches.  Psychiatric/Behavioral: Negative for dysphoric mood and sleep disturbance. The patient is not nervous/anxious.     Patient Active Problem List   Diagnosis Date Noted  . Automatic implantable cardioverter-defibrillator in situ 11/16/2017  . Vitamin D deficiency 09/16/2017  . Hypothyroidism 09/16/2017  . BPH associated with nocturia 08/20/2017  . Allergic rhinitis 08/17/2017  . Gait instability 08/17/2017  . Hypocalcemia 08/17/2017  . CKD (chronic kidney disease) stage 3, GFR 30-59 ml/min (HCC) 08/17/2017  . Gout 08/17/2017  . RLS (restless legs syndrome) 10/23/2016  . 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 Prostate enlargement 09/04/2016  . Moderate mitral insufficiency 02/21/2015  . CAD (coronary artery disease) 09/18/2014  . Paroxysmal A-fib ([place]) 09/01/2014  . 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 CLL (chronic lymphocytic leukemia) ([place]) 02/07/2014  . COPD, mild ([place]) 02/07/2014  .  OSA on CPAP 02/07/2014  . Polycythemia 02/07/2014  . DJD (degenerative joint disease) of hip 06/24/2011    Prior to Admission medications   Medication Sig Start Date End Date Taking? Authorizing Provider  allopurinol (ZYLOPRIM) 100 MG tablet TAKE 1 TABLET BY MOUTH ONCE EVERY  EVENING 05/25/17  Yes [provider]  apixaban (ELIQUIS) 5 MG TABS tablet TAKE 1 TABLET EVERY 12 HOURS 05/27/17  Yes [provider]  levothyroxine (SYNTHROID, LEVOTHROID) 75 MCG tablet TAKE 1 TABLET BY MOUTH ONCE DAILY BEFOREBREAKFAST. 01/18/18  Yes Plonk, Gwyndolyn Saxon, MD  lisinopril (PRINIVIL,ZESTRIL) 5 MG tablet Take 2.5 mg daily by mouth.   Yes [provider]  metoprolol tartrate (LOPRESSOR) 25 MG tablet Take 25 mg 2 (two) times daily by mouth.   Yes [provider]  rOPINIRole (REQUIP) 0.25 MG tablet Take 2 tablets by mouth at bedtime. 02/10/17  Yes [provider]  spironolactone (ALDACTONE) 25 MG tablet TAKE 1/2 TABLET BY MOUTH ONCE DAILY 01/18/18  Yes Plonk, Gwyndolyn Saxon, MD  tamsulosin (FLOMAX) 0.4 MG CAPS capsule Take 1 capsule (0.4 mg total) by mouth daily after supper. 12/21/17  Yes Plonk, Gwyndolyn Saxon, MD  torsemide (DEMADEX) 20 MG tablet Take 10 mg by mouth daily.  08/14/16  Yes [provider]  Vitamin D, Cholecalciferol, 1000 units CAPS Take 2 capsules by mouth daily. 09/17/17  Yes Plonk, Gwyndolyn Saxon, MD    No Known Allergies  Past Surgical History:  Procedure Laterality Date  . CARDIAC DEFIBRILLATOR PLACEMENT  2015    Social History   Tobacco Use  . Smoking status: Former Smoker    Packs/day: 2.00    Years: 42.00    Pack years: 84.00    Types: Cigarettes    Last attempt to quit: 1991    Years since quitting: 28.4  . Smokeless tobacco: Current User    Types: Chew  . Tobacco comment: smoking cessation materials not required  Substance Use Topics  . Alcohol use: Yes    Comment: 6 beers per month  . Drug use: No     Medication list has been reviewed and updated.  Current Meds  Medication Sig  . allopurinol (ZYLOPRIM) 100 MG tablet TAKE 1 TABLET BY MOUTH ONCE EVERY EVENING  . apixaban (ELIQUIS) 5 MG TABS tablet TAKE 1 TABLET EVERY 12 HOURS  . levothyroxine (SYNTHROID, LEVOTHROID) 75 MCG tablet TAKE 1 TABLET BY MOUTH ONCE DAILY  BEFOREBREAKFAST.  Marland Kitchen lisinopril (PRINIVIL,ZESTRIL) 5 MG tablet Take 2.5 mg daily by mouth.  . metoprolol tartrate (LOPRESSOR) 25 MG tablet Take 25 mg 2 (two) times daily by mouth.  Marland Kitchen rOPINIRole (REQUIP) 0.25 MG tablet Take 2 tablets by mouth at bedtime.  Marland Kitchen spironolactone (ALDACTONE) 25 MG tablet TAKE 1/2 TABLET BY MOUTH ONCE DAILY  . tamsulosin (FLOMAX) 0.4 MG CAPS capsule Take 1 capsule (0.4 mg total) by mouth daily after supper.  . torsemide (DEMADEX) 20 MG tablet Take 10 mg by mouth daily.   . Vitamin D, Cholecalciferol, 1000 units CAPS Take 2 capsules by mouth daily.    PHQ 2/9 Scores 10/07/2017 08/17/2017  PHQ - 2 Score 0 0    Physical Exam  Constitutional: He is oriented to person, place, and time. He appears well-developed and well-nourished. No distress.  HENT:  Head: Normocephalic.  Eyes: Pupils are equal, round, and reactive to light. EOM are normal.  Neck: Normal range of motion.  Cardiovascular: Normal rate, regular rhythm and normal pulses.  Murmur heard.  Systolic murmur is present with a grade of  2/6. Pulmonary/Chest: Effort normal. He has decreased breath sounds (and mild upper airwary breath sounds). He has no rales.  Lymphadenopathy:    He has no cervical adenopathy.  Neurological: He is alert and oriented to person, place, and time.  Skin: Skin is warm.  Several healing skin tears on both forearms  Psychiatric: He has a normal mood and affect.    BP (!) 106/40   Pulse (!) 50   Temp (!) 97.5 F (36.4 C) (Oral)   Resp 16   Ht 6\' 1"  (1.854 m)   Wt 216 lb (98 kg)   SpO2 95%   BMI 28.50 kg/m   Assessment and Plan: 1. Bronchitis Use Delsym during the day - azithromycin (ZITHROMAX Z-PAK) 250 MG tablet; UAD  Dispense: 6 each; Refill: 0 - guaiFENesin-codeine (ROBITUSSIN AC) 100-10 MG/5ML syrup; Take 5 mLs by mouth 3 (three) times daily as needed for cough.  Dispense: 118 mL; Refill: 0  2. CLL (chronic lymphocytic leukemia) ([place]) Followed by Oncology  3. CKD  (chronic kidney disease) stage 3, GFR 30-59 ml/min (HCC) Being monitored Lab Results  Component Value Date   CREATININE 1.33 (H) 02/02/2018   BUN 22 (H) 02/02/2018   NA 137 02/02/2018   K 4.9 02/02/2018   CL 104 02/02/2018   CO2 23 02/02/2018   4. COPD, mild (HCC) - azithromycin (ZITHROMAX Z-PAK) 250 MG tablet; UAD  Dispense: 6 each; Refill: 0 - guaiFENesin-codeine (ROBITUSSIN AC) 100-10 MG/5ML syrup; Take 5 mLs by mouth 3 (three) times daily as needed for cough.  Dispense: 118 mL; Refill: 0  5. Congestive heart failure with cardiomyopathy (HCC) Stable, on several medications - beta blocker, DOAC, ACE, spironolactone Continue follow up with Cardiology  6. OSA on CPAP Discuss use next visit   Meds ordered this encounter  Medications  . azithromycin (ZITHROMAX Z-PAK) 250 MG tablet    Sig: UAD    Dispense:  6 each    Refill:  0  . guaiFENesin-codeine (ROBITUSSIN AC) 100-10 MG/5ML syrup    Sig: Take 5 mLs by mouth 3 (three) times daily as needed for cough.    Dispense:  118 mL    Refill:  0    Partially dictated using Editor, commissioning. Any errors are unintentional.  Halina Maidens, MD [facility]  03/09/2018

## 2018-04-06 ENCOUNTER — Encounter: Payer: Self-pay | Admitting: Internal Medicine

## 2018-04-06 ENCOUNTER — Inpatient Hospital Stay: Payer: Medicare Other | Attending: Internal Medicine | Admitting: Internal Medicine

## 2018-04-06 ENCOUNTER — Inpatient Hospital Stay: Payer: Medicare Other

## 2018-04-06 ENCOUNTER — Other Ambulatory Visit: Payer: Self-pay

## 2018-04-06 VITALS — BP 113/66 | HR 70 | Temp 97.6°F | Resp 20 | Ht 73.0 in | Wt 213.8 lb

## 2018-04-06 DIAGNOSIS — I4891 Unspecified atrial fibrillation: Secondary | ICD-10-CM | POA: Insufficient documentation

## 2018-04-06 DIAGNOSIS — Z5112 Encounter for antineoplastic immunotherapy: Secondary | ICD-10-CM | POA: Diagnosis present

## 2018-04-06 DIAGNOSIS — Z87891 Personal history of nicotine dependence: Secondary | ICD-10-CM | POA: Insufficient documentation

## 2018-04-06 DIAGNOSIS — R5383 Other fatigue: Secondary | ICD-10-CM

## 2018-04-06 DIAGNOSIS — R233 Spontaneous ecchymoses: Secondary | ICD-10-CM | POA: Diagnosis not present

## 2018-04-06 DIAGNOSIS — Z7901 Long term (current) use of anticoagulants: Secondary | ICD-10-CM | POA: Insufficient documentation

## 2018-04-06 DIAGNOSIS — C9110 Chronic lymphocytic leukemia of B-cell type not having achieved remission: Secondary | ICD-10-CM | POA: Diagnosis not present

## 2018-04-06 DIAGNOSIS — E875 Hyperkalemia: Secondary | ICD-10-CM | POA: Insufficient documentation

## 2018-04-06 DIAGNOSIS — Z7189 Other specified counseling: Secondary | ICD-10-CM

## 2018-04-06 LAB — CBC WITH DIFFERENTIAL/PLATELET
BASOS PCT: 0 %
Basophils Absolute: 0.3 10*3/uL — ABNORMAL HIGH (ref 0–0.1)
Eosinophils Absolute: 0.4 10*3/uL (ref 0–0.7)
Eosinophils Relative: 0 %
HEMATOCRIT: 38.3 % — AB (ref 40.0–52.0)
HEMOGLOBIN: 12.4 g/dL — AB (ref 13.0–18.0)
Lymphocytes Relative: 94 %
Lymphs Abs: 222.7 10*3/uL — ABNORMAL HIGH (ref 1.0–3.6)
MCH: 28.4 pg (ref 26.0–34.0)
MCHC: 32.3 g/dL (ref 32.0–36.0)
MCV: 88 fL (ref 80.0–100.0)
MONOS PCT: 2 %
Monocytes Absolute: 4.6 10*3/uL — ABNORMAL HIGH (ref 0.2–1.0)
NEUTROS ABS: 10.1 10*3/uL — AB (ref 1.4–6.5)
NEUTROS PCT: 4 %
Platelets: 67 10*3/uL — ABNORMAL LOW (ref 150–440)
RBC: 4.35 MIL/uL — ABNORMAL LOW (ref 4.40–5.90)
RDW: 15.1 % — AB (ref 11.5–14.5)
WBC: 238.1 10*3/uL (ref 3.8–10.6)

## 2018-04-06 LAB — COMPREHENSIVE METABOLIC PANEL
ALK PHOS: 73 U/L (ref 38–126)
ALT: 16 U/L (ref 0–44)
ANION GAP: 10 (ref 5–15)
AST: 31 U/L (ref 15–41)
Albumin: 4 g/dL (ref 3.5–5.0)
BILIRUBIN TOTAL: 0.8 mg/dL (ref 0.3–1.2)
BUN: 15 mg/dL (ref 8–23)
CALCIUM: 8.4 mg/dL — AB (ref 8.9–10.3)
CO2: 23 mmol/L (ref 22–32)
Chloride: 102 mmol/L (ref 98–111)
Creatinine, Ser: 1.28 mg/dL — ABNORMAL HIGH (ref 0.61–1.24)
GFR, EST NON AFRICAN AMERICAN: 53 mL/min — AB (ref >60)
Glucose, Bld: 101 mg/dL — ABNORMAL HIGH (ref 70–99)
Potassium: 5.1 mmol/L (ref 3.5–5.1)
SODIUM: 135 mmol/L (ref 135–145)
TOTAL PROTEIN: 6 g/dL — AB (ref 6.5–8.1)

## 2018-04-06 LAB — LACTATE DEHYDROGENASE: LDH: 148 U/L (ref 98–192)

## 2018-04-06 MED ORDER — DEXAMETHASONE 4 MG PO TABS: 4.0000 mg | ORAL_TABLET | Freq: Every day | ORAL | 1 refills | Status: AC

## 2018-04-06 MED ORDER — MONTELUKAST SODIUM 10 MG PO TABS: 10.0000 mg | ORAL_TABLET | Freq: Every day | ORAL | 2 refills | Status: DC

## 2018-04-06 NOTE — Progress Notes (Signed)
Clifford Herrera OFFICE PROGRESS NOTE  Patient Care Team: [name], MD as PCP - General (Internal Medicine) Corey Skains, MD as Consulting Physician (Cardiology) Cammie Sickle, MD as Consulting Physician (Oncology)  Cancer Staging No matching staging information was found for the patient.   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 Surveillance; [facility]- UNMUTATED [dec 2017]  # AUG 2018- FISH panel analysis was positive for loss of one 13q14 signal. NEGATIVE for CCND1/IGH, ATM, chromosome 12, and TP53 were normal. MAY 2019- : 85% OF NUCLEI POSITIVE FOR A 13Q DELETION  #CHF/CAD s/p Defib- on Eliquis  [Dr.Kowalski]; Orthostatic hypotension   -------------------------------------------------------------   DIAGNOSIS: CLL  STAGE:  IV ;GOALS: CONTROL/palliative  CURRENT/MOST RECENT THERAPY;: AUG 2019 GAZYVA      CLL (chronic lymphocytic leukemia) ([place])   04/25/2018 -  Chemotherapy    The patient had obinutuzumab (GAZYVA) 100 mg in sodium chloride 0.9 % 100 mL (0.9615 mg/mL) chemo infusion, 100 mg, Intravenous, Once, 0 of 6 cycles  for chemotherapy treatment.          INTERVAL HISTORY:  Clifford Herrera 77 y.o.  male pleasant patient [name] currently under surveillance is here for follow-up.  As per the wife patient has been fatigue for the last many months progressively getting worse.  He continues to have easy bruising.  Otherwise denies having any recent admissions to the hospital for congestive heart failure or any other illness.  Complains of early satiety.  No significant night sweats.   Review of Systems  Constitutional: Positive for malaise/fatigue. Negative for chills, diaphoresis, fever and weight loss.  HENT: Negative for nosebleeds and sore throat.   Eyes: Negative for double  vision.  Respiratory: Negative for cough, hemoptysis, sputum production, shortness of breath and wheezing.   Cardiovascular: Negative for chest pain, palpitations, orthopnea and leg swelling.  Gastrointestinal: Negative for abdominal pain, blood in stool, constipation, diarrhea, heartburn, melena, nausea and vomiting.  Genitourinary: Negative for dysuria, frequency and urgency.  Musculoskeletal: Negative for back pain and joint pain.  Skin: Negative.  Negative for itching and rash.  Neurological: Negative for dizziness, tingling, focal weakness, weakness and headaches.  Endo/Heme/Allergies: Bruises/bleeds easily.  Psychiatric/Behavioral: Negative for depression. The patient is not nervous/anxious and does not have insomnia.       PAST MEDICAL HISTORY :  Past Medical History:  Diagnosis Date  . CLL (chronic lymphocytic leukemia) ([place])   . CPAP (continuous positive airway pressure) dependence   . Heart failure ([place])   . HOH (hard of hearing)   . Presence of automatic (implantable) cardiac defibrillator   . Sleep apnea   . Upper respiratory infection    chronic    PAST SURGICAL HISTORY :   Past Surgical History:  Procedure Laterality Date  . CARDIAC DEFIBRILLATOR PLACEMENT  2015    FAMILY HISTORY :   Family History  Problem Relation Age of Onset  . Diabetes Mother   . CAD Mother   . Diabetes Father   . CAD Father     SOCIAL HISTORY:   Social History   Tobacco Use  . Smoking status: Former Smoker    Packs/day: 2.00    Years: 42.00    Pack years: 84.00    Types: Cigarettes    Last attempt to quit: 1991    Years since quitting: 28.5  .  Smokeless tobacco: Current User    Types: Chew  . Tobacco comment: smoking cessation materials not required  Substance Use Topics  . Alcohol use: Yes    Comment: 6 beers per month  . Drug use: No    ALLERGIES:  has No Known Allergies.  MEDICATIONS:  Current Outpatient Medications  Medication Sig Dispense Refill  . allopurinol  (ZYLOPRIM) 100 MG tablet TAKE 1 TABLET BY MOUTH ONCE EVERY EVENING    . apixaban (ELIQUIS) 5 MG TABS tablet TAKE 1 TABLET EVERY 12 HOURS    . levothyroxine (SYNTHROID, LEVOTHROID) 75 MCG tablet TAKE 1 TABLET BY MOUTH ONCE DAILY BEFOREBREAKFAST. 90 tablet 3  . metoprolol tartrate (LOPRESSOR) 25 MG tablet Take 25 mg 2 (two) times daily by mouth.    Marland Kitchen rOPINIRole (REQUIP) 0.25 MG tablet Take 2 tablets by mouth at bedtime.    Marland Kitchen spironolactone (ALDACTONE) 25 MG tablet TAKE 1/2 TABLET BY MOUTH ONCE DAILY 45 tablet 3  . tamsulosin (FLOMAX) 0.4 MG CAPS capsule Take 1 capsule (0.4 mg total) by mouth daily after supper. 90 capsule 3  . torsemide (DEMADEX) 20 MG tablet Take 10 mg by mouth daily.     . Vitamin D, Cholecalciferol, 1000 units CAPS Take 2 capsules by mouth daily. 60 capsule   . acyclovir (ZOVIRAX) 400 MG tablet One pill a day [to prevent shingles] 30 tablet 3  . dexamethasone (DECADRON) 4 MG tablet Take 1 tablet (4 mg total) by mouth daily. Take 1 tablet starting 2 days prior to infusion. Do not take on day of infusion 30 tablet 1  . montelukast (SINGULAIR) 10 MG tablet Take 1 tablet (10 mg total) by mouth at bedtime. Take 1 tablet starting 2 days prior to infusion x 4 days. 30 tablet 2   No current facility-administered medications for this visit.     PHYSICAL EXAMINATION: ECOG PERFORMANCE STATUS: 1 - Symptomatic but completely ambulatory  BP 113/66 (Patient Position: Sitting)   Pulse 70   Temp 97.6 F (36.4 C) (Tympanic)   Resp 20   Ht '6\' 1"'  (1.854 m)   Wt 213 lb 13.5 oz (97 kg)   BMI 28.21 kg/m   Filed Weights   04/06/18 0938  Weight: 213 lb 13.5 oz (97 kg)    GENERAL: Well-nourished well-developed; Alert, no distress and comfortable. Accompanied by family.  EYES: no pallor or icterus OROPHARYNX: no thrush or ulceration; NECK: supple; no lymph nodes felt. LYMPH:  no palpable lymphadenopathy in the axillary or inguinal regions LUNGS: Decreased breath sounds auscultation  bilaterally. No wheeze or crackles HEART/CVS: regular rate & rhythm and no murmurs; No lower extremity edema ABDOMEN:abdomen soft, non-tender and normal bowel sounds. No hepatomegaly. Positive for splenomegaly.  Musculoskeletal:no cyanosis of digits and no clubbing  PSYCH: alert & oriented x 3 with fluent speech NEURO: no focal motor/sensory deficits SKIN: Chronic bruises noted    LABORATORY DATA:  I have reviewed the data as listed    Component Value Date/Time   NA 135 04/06/2018 0932   NA 137 12/04/2017 1050   NA 136 07/25/2014 0414   K 5.1 04/06/2018 0932   K 5.6 (H) 07/25/2014 0414   CL 102 04/06/2018 0932   CL 101 07/25/2014 0414   CO2 23 04/06/2018 0932   CO2 24 07/25/2014 0414   GLUCOSE 101 (H) 04/06/2018 0932   GLUCOSE 143 (H) 07/25/2014 0414   BUN 15 04/06/2018 0932   BUN 15 12/04/2017 1050   BUN 33 (H) 07/25/2014 0414  CREATININE 1.28 (H) 04/06/2018 0932   CREATININE 1.34 (H) 01/15/2015 0956   CALCIUM 8.4 (L) 04/06/2018 0932   CALCIUM 8.3 (L) 07/25/2014 0414   PROT 6.0 (L) 04/06/2018 0932   PROT 5.8 (L) 08/17/2017 1551   PROT 6.4 07/24/2014 0937   ALBUMIN 4.0 04/06/2018 0932   ALBUMIN 4.3 08/17/2017 1551   ALBUMIN 4.0 07/24/2014 0937   AST 31 04/06/2018 0932   AST 31 07/24/2014 0937   ALT 16 04/06/2018 0932   ALT 23 07/24/2014 0937   ALKPHOS 73 04/06/2018 0932   ALKPHOS 53 07/24/2014 0937   BILITOT 0.8 04/06/2018 0932   BILITOT 0.4 08/17/2017 1551   BILITOT 1.1 (H) 07/24/2014 0937   GFRNONAA 53 (L) 04/06/2018 0932   GFRNONAA 52 (L) 01/15/2015 0956   GFRAA >60 04/06/2018 0932   GFRAA >60 01/15/2015 0956    No results found for: SPEP, UPEP  Lab Results  Component Value Date   WBC 238.1 (HH) 04/06/2018   NEUTROABS 10.1 (H) 04/06/2018   HGB 12.4 (L) 04/06/2018   HCT 38.3 (L) 04/06/2018   MCV 88.0 04/06/2018   PLT 67 (L) 04/06/2018      Chemistry      Component Value Date/Time   NA 135 04/06/2018 0932   NA 137 12/04/2017 1050   NA 136  07/25/2014 0414   K 5.1 04/06/2018 0932   K 5.6 (H) 07/25/2014 0414   CL 102 04/06/2018 0932   CL 101 07/25/2014 0414   CO2 23 04/06/2018 0932   CO2 24 07/25/2014 0414   BUN 15 04/06/2018 0932   BUN 15 12/04/2017 1050   BUN 33 (H) 07/25/2014 0414   CREATININE 1.28 (H) 04/06/2018 0932   CREATININE 1.34 (H) 01/15/2015 0956      Component Value Date/Time   CALCIUM 8.4 (L) 04/06/2018 0932   CALCIUM 8.3 (L) 07/25/2014 0414   ALKPHOS 73 04/06/2018 0932   ALKPHOS 53 07/24/2014 0937   AST 31 04/06/2018 0932   AST 31 07/24/2014 0937   ALT 16 04/06/2018 0932   ALT 23 07/24/2014 0937   BILITOT 0.8 04/06/2018 0932   BILITOT 0.4 08/17/2017 1551   BILITOT 1.1 (H) 07/24/2014 0937       RADIOGRAPHIC STUDIES: I have personally reviewed the radiological images as listed and agreed with the findings in the report. No results found.   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  Worsening.   #patient continues to have worsening white count/platelets [60-70s]; more importantly worsening fatigue [although hemoglobin is stable].  #I think patient is quite symptomatic from his underlying disease; I would recommend treatment with Gazyva.  Hold ibrutinib given/easy bruising/on blood thinners/A. Fib.  Patient understands treatments are palliative not curative.  #I had a long discussion with the patient regarding the mechanism of treatment action of Gazyva; and also the risk of potential rare infections; but most commonly infusion reactions.  Hepatitis work-up negative.   #Risk of infusion reactions is about 90%-however this could be mitigated by using premedications with steroids/Singulair/Tylenol Benadryl prior to infusion.  This was extensively discussed  with the patient; dexamethasone sent to his pharmacy.  #History of A. fib on Eliquis [platelets 60s-70s]; easy bruising; continue Eliquis for now.STABLE.   # recommend Infectious prophylaxis: Acyclovir   # follow up in 3 weeks/ Gazyva/[place].   # 40 minutes face-to-face with  the patient discussing the above plan of care; more than 50% of time spent on prognosis/ natural history; counseling and coordination.  CC: Drs.Singh/ Dr.Kowalski/Berglund   Orders Placed This Encounter  Procedures  . Comprehensive metabolic panel    Standing Status:   Future    Standing Expiration Date:   04/07/2019  . CBC with Differential    Standing Status:   Future    Standing Expiration Date:   04/07/2019   All questions were answered. The patient knows to call the clinic with any problems, questions or concerns.      Cammie Sickle, MD 04/11/2018 5:20 PM

## 2018-04-06 NOTE — Assessment & Plan Note (Addendum)
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  Worsening.   #patient continues to have worsening white count/platelets [60-70s]; more importantly worsening fatigue [although hemoglobin is stable].  #I think patient is quite symptomatic from his underlying disease; I would recommend treatment with Gazyva.  Hold ibrutinib given/easy bruising/on blood thinners/A. Fib.  Patient understands treatments are palliative not curative.  #I had a long discussion with the patient regarding the mechanism of treatment action of Gazyva; and also the risk of potential rare infections; but most commonly infusion reactions.  Hepatitis work-up negative.   #Risk of infusion reactions is about 90%-however this could be mitigated by using premedications with steroids/Singulair/Tylenol Benadryl prior to infusion.  This was extensively discussed with the patient; dexamethasone sent to his pharmacy.  #History of A. fib on Eliquis [platelets 60s-70s]; easy bruising; continue Eliquis for now.STABLE.   # recommend Infectious prophylaxis: Acyclovir   # follow up in 3 weeks/ Gazyva/[place].   # 40 minutes face-to-face with the patient discussing the above plan of care; more than 50% of time spent on prognosis/ natural history; counseling and coordination.  CC: Drs.Singh/ Dr.Kowalski/Berglund

## 2018-04-06 NOTE — Patient Instructions (Signed)
Decadron 4 mg tablet- take 1 tablet 2 days prior to the infusion. DO NOT TAKE on day of infusion. Dr. B will order steroids with your IV treatment.  Singular 10 mg tablet. Take 1 tablet by mouth at bedtime. Start taking 2 days prior to the infusion. Take for a total of 4 days.  Tylenol 650 mg - Take 1 tablet by mouth at bedtime. Start taking 2 days prior to the infusion. Take for a total of 4 days.  Benadryl 25 mg Take 1 tablet by mouth at bedtime. Start taking 2 days prior to the infusion. Take for a total of 4 days.

## 2018-04-07 ENCOUNTER — Encounter: Payer: Self-pay | Admitting: Internal Medicine

## 2018-04-11 DIAGNOSIS — Z7189 Other specified counseling: Secondary | ICD-10-CM | POA: Insufficient documentation

## 2018-04-11 MED ORDER — ACYCLOVIR 400 MG PO TABS: ORAL_TABLET | ORAL | 3 refills | Status: DC

## 2018-04-11 NOTE — Progress Notes (Signed)
START ON PATHWAY REGIMEN - Lymphoma and CLL     A cycle is every 28 days:     Obinutuzumab      Obinutuzumab      Obinutuzumab      Obinutuzumab      Chlorambucil   **Always confirm dose/schedule in your pharmacy ordering system**  Patient Characteristics: Chronic Lymphocytic Leukemia (CLL), First Line, Treatment Indicated, Not a Candidate for BTK Inhibitor, 17p del (-) or Unknown, Frail Patient Disease Type: Chronic Lymphocytic Leukemia (CLL) Disease Type: Not Applicable Disease Type: Not Applicable Line of therapy: First Line RAI Stage: IV Treatment Indicated<= Treatment Indicated 17p Deletion Status: Negative Fit or Frail Patient<= Frail Patient Intent of Therapy: Non-Curative / Palliative Intent, Discussed with Patient

## 2018-04-20 ENCOUNTER — Ambulatory Visit: Payer: Medicare Other

## 2018-04-20 ENCOUNTER — Ambulatory Visit
Admission: RE | Admit: 2018-04-20 | Discharge: 2018-04-20 | Disposition: A | Discharge status: Home / Self Care | Payer: Medicare Other | Source: Ambulatory Visit | Attending: Emergency Medicine | Admitting: Emergency Medicine

## 2018-04-20 ENCOUNTER — Ambulatory Visit
Admission: EM | Admit: 2018-04-20 | Discharge: 2018-04-20 | Disposition: A | Discharge status: Home / Self Care | Payer: Medicare Other | Attending: Family Medicine | Admitting: Family Medicine

## 2018-04-20 ENCOUNTER — Encounter: Payer: Self-pay | Admitting: Emergency Medicine

## 2018-04-20 ENCOUNTER — Other Ambulatory Visit: Payer: Self-pay

## 2018-04-20 DIAGNOSIS — R2243 Localized swelling, mass and lump, lower limb, bilateral: Secondary | ICD-10-CM

## 2018-04-20 DIAGNOSIS — M7989 Other specified soft tissue disorders: Secondary | ICD-10-CM | POA: Diagnosis not present

## 2018-04-20 DIAGNOSIS — M79605 Pain in left leg: Secondary | ICD-10-CM | POA: Diagnosis present

## 2018-04-20 NOTE — ED Triage Notes (Signed)
Pt here today c/o leg swelling. Bilaterally left worse than right. Noticed it yesterday.  He also has pain on top of his left foot. He and wife do not think his veins on top of his foot were as pronounced as they were before. Pt has CHF and is on 2 diuretics  and he has taken them today. No chest pain. Occasionally he gets SOB, but this is ppt baseline.

## 2018-04-20 NOTE — ED Provider Notes (Addendum)
[facility]    CSN: [id number] Arrival date & time: 04/20/18  1248     History   Chief Complaint Chief Complaint  Patient presents with  . Leg Swelling    bilateral    HPI Clifford Herrera is a 77 y.o. male.   HPI  77 year old male accompanied by his wife presents with leg swelling much worse on the left than the right. Noticed initially yesterday.  Having pain on the top of his foot and over the medial malleolar area.  He has had no injury.  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  He started Valtrex this morning and will be taking it prophylactically throughout the chemotherapy..  He has a history of congestive heart failure with cardiomyopathy, he has an implantable automatic defibrillator.  Has nonvalvular proximal A. fib for which he is taking apixaban.  He has chronic kidney disease stage III.  Primary care  physician is Dr. Army Melia and cardiologist is Dr. Nehemiah Massed.  He denies any shortness of breath.  Had no fever or chills.  Taking all of his medications as prescribed on time.           Past Medical History:  Diagnosis Date  . CLL (chronic lymphocytic leukemia) ([place])   . CPAP (continuous positive airway pressure) dependence   . Heart failure ([place])   . HOH (hard of hearing)   . Presence of automatic (implantable) cardiac defibrillator   . Sleep apnea   . Upper respiratory infection    chronic    Patient Active Problem List   Diagnosis Date Noted  . Goals of care, counseling/discussion 04/11/2018  . Automatic implantable cardioverter-defibrillator in situ 11/16/2017  . Vitamin D deficiency 09/16/2017  . Hypothyroidism 09/16/2017  . BPH associated with nocturia 08/20/2017  . Allergic rhinitis 08/17/2017  . Gait instability 08/17/2017  . Hypocalcemia 08/17/2017  . CKD (chronic kidney disease) stage 3, GFR 30-59 ml/min (HCC) 08/17/2017  . Gout 08/17/2017  . RLS (restless legs  syndrome) 10/23/2016  . 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 Prostate enlargement 09/04/2016  . Moderate mitral insufficiency 02/21/2015  . CAD (coronary artery disease) 09/18/2014  . Paroxysmal A-fib ([place]) 09/01/2014  . Congestive heart failure with cardiomyopathy ([place]) 07/13/2014  . CLL (chronic lymphocytic leukemia) ([place]) 02/07/2014  . 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 OSA on CPAP 02/07/2014  . Polycythemia 02/07/2014  . 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 CARDIAC DEFIBRILLATOR PLACEMENT  2015       Home Medications    Prior to Admission medications   Medication Sig Start Date End Date Taking? Authorizing Provider  acyclovir (ZOVIRAX) 400 MG tablet One pill a day [to prevent shingles] 04/11/18  Yes Cammie Sickle, MD  allopurinol (ZYLOPRIM) 100 MG tablet TAKE 1 TABLET BY MOUTH ONCE EVERY EVENING 05/25/17  Yes [provider]  apixaban (ELIQUIS) 5 MG TABS tablet TAKE 1 TABLET EVERY 12 HOURS 05/27/17  Yes [provider]  levothyroxine (SYNTHROID, LEVOTHROID) 75 MCG tablet TAKE 1 TABLET BY MOUTH ONCE DAILY BEFOREBREAKFAST. 01/18/18  Yes Plonk, William, MD  metoprolol tartrate (LOPRESSOR) 25 MG tablet Take 25 mg 2 (two) times daily by mouth.   Yes [provider]  montelukast (SINGULAIR) 10 MG tablet Take 1 tablet (10 mg total) by mouth at bedtime. Take 1 tablet starting 2  days prior to infusion x 4 days. 04/06/18  Yes Cammie Sickle, MD  rOPINIRole (REQUIP) 0.25 MG tablet Take 2 tablets by mouth at bedtime. 02/10/17  Yes [provider]  spironolactone (ALDACTONE) 25 MG tablet TAKE 1/2 TABLET BY MOUTH ONCE DAILY 01/18/18  Yes Plonk, Gwyndolyn Saxon, MD  tamsulosin (FLOMAX) 0.4 MG CAPS capsule Take 1 capsule (0.4 mg total) by mouth daily after supper. 12/21/17  Yes Plonk, Gwyndolyn Saxon, MD  torsemide (DEMADEX) 20 MG tablet Take 10 mg by mouth  daily.  08/14/16  Yes [provider]  Vitamin D, Cholecalciferol, 1000 units CAPS Take 2 capsules by mouth daily. 09/17/17  Yes Plonk, Gwyndolyn Saxon, MD  dexamethasone (DECADRON) 4 MG tablet Take 1 tablet (4 mg total) by mouth daily. Take 1 tablet starting 2 days prior to infusion. Do not take on day of infusion 04/06/18   Cammie Sickle, MD    Family History Family History  Problem Relation Age of Onset  . Diabetes Mother   . CAD Mother   . Diabetes Father   . CAD Father     Social History Social History   Tobacco Use  . Smoking status: Former Smoker    Packs/day: 2.00    Years: 42.00    Pack years: 84.00    Types: Cigarettes    Last attempt to quit: 1991    Years since quitting: 28.5  . Smokeless tobacco: Current User    Types: Chew  . Tobacco comment: smoking cessation materials not required  Substance Use Topics  . Alcohol use: Yes    Comment: 6 beers per month  . Drug use: No     Allergies   Patient has no known allergies.   Review of Systems Review of Systems  Constitutional: Positive for activity change. Negative for chills, fatigue and fever.  Cardiovascular: Positive for leg swelling.  All other systems reviewed and are negative.    Physical Exam Triage Vital Signs ED Triage Vitals  Enc Vitals Group     BP 04/20/18 1310 107/77     Pulse Rate 04/20/18 1310 78     Resp 04/20/18 1310 17     Temp 04/20/18 1310 97.6 F (36.4 C)     Temp Source 04/20/18 1310 Oral     SpO2 04/20/18 1310 100 %     Weight 04/20/18 1307 208 lb (94.3 kg)     Height 04/20/18 1307 6\' 1"  (1.854 m)     Head Circumference --      Peak Flow --      Pain Score 04/20/18 1307 4     Pain Loc --      Pain Edu? --      Excl. in [place]? --    No data found.  Updated Vital Signs BP 107/77 (BP Location: Left Arm)   Pulse 78   Temp 97.6 F (36.4 C) (Oral)   Resp 17   Ht 6\' 1"  (1.854 m)   Wt 208 lb (94.3 kg)   SpO2 100%   BMI 27.44 kg/m   Visual Acuity Right Eye  Distance:   Left Eye Distance:   Bilateral Distance:    Right Eye Near:   Left Eye Near:    Bilateral Near:     Physical Exam  Constitutional: He is oriented to person, place, and time. He appears well-developed and well-nourished. No distress.  HENT:  Head: Normocephalic.  Eyes: Pupils are equal, round, and reactive to light.  Neck: Normal range of motion.  Pulmonary/Chest: Effort normal and breath sounds normal.  Musculoskeletal: Normal range of motion. He exhibits edema and tenderness.  Examination of the left leg in comparison to the right shows increased swelling.  Circumference on the left measures 38.4 cm.  On the right is 37 cm.  1+ pitting edema on the left.  Mild tenderness to palpation over the inferior medial malleolus and into the forefoot.  Range of motion of the ankle is normal.  Dorsalis pedis pulses palpable.  There is tenderness to palpation of the calf mostly posteriorly.  Neurological: He is alert and oriented to person, place, and time.  Skin: Skin is warm and dry. He is not diaphoretic.  Psychiatric: He has a normal mood and affect. His behavior is normal. Judgment and thought content normal.  Nursing note and vitals reviewed.    UC Treatments / Results  Labs (all labs ordered are listed, but only abnormal results are displayed) Labs Reviewed - No data to display  EKG None  Radiology US Venous Img Lower Unilateral Left  Result Date: 04/20/2018 CLINICAL DATA:  Left lower extremity pain and swelling EXAM: LEFT LOWER EXTREMITY VENOUS DUPLEX ULTRASOUND TECHNIQUE: Doppler venous assessment of the left lower extremity deep venous system was performed, including characterization of spectral flow, compressibility, and phasicity. COMPARISON:  None. FINDINGS: There is complete compressibility of the left common femoral, femoral, and popliteal veins. Doppler analysis demonstrates respiratory phasicity and augmentation of flow with calf compression. No obvious superficial  vein or calf vein thrombosis. There is subcutaneous edema at the dorsum of the left foot. IMPRESSION: No evidence of left lower extremity DVT. Electronically Signed   By: Marybelle Killings M.D.   On: 04/20/2018 16:44    Procedures Procedures (including critical care time)  Medications Ordered in UC Medications - No data to display  Initial Impression / Assessment and Plan / UC Course  I have reviewed the triage vital signs and the nursing notes.  Pertinent labs & imaging results that were available during my care of the patient were reviewed by me and considered in my medical decision making (see chart for details).     Plan: 1. Test/x-ray results and diagnosis reviewed with patient 2. rx as per orders; risks, benefits, potential side effects reviewed with patient 3. Recommend supportive treatment with fitting of the above the level of your heart several times daily to control swelling.  Then him for venous Doppler ultrasound of the left lower extremity.  If this is negative he will need to follow-up with cardiology or his primary care physician for further evaluation.  Left our facility in stable condition and is going to [facility] for a venous Doppler ultrasound at 4 PM this afternoon. 4. F/u prn if symptoms worsen or don't improve  Final Clinical Impressions(s) / UC Diagnoses   Final diagnoses:  Left leg swelling     Discharge Instructions     Elevate your legs above the level of your heart several times each day.  Follow-up with Dr. Montine Circle or Dr. Army Melia later this week for further evaluation.    ED Prescriptions    None     Controlled Substance Prescriptions [place] Controlled Substance Registry consulted? Not Applicable   Juanda Crumble 04/20/18 1433    Phone call from [facility] stating that Mr. Scholer had no evidence of DVT.  Attempted to contact Mr. Jamar but his phone went to voicemail.  I left a voicemail for him to contact us.   Lorin Picket,  PA-C 04/20/18 1845

## 2018-04-20 NOTE — Discharge Instructions (Addendum)
Elevate your legs above the level of your heart several times each day.  Follow-up with Dr. Montine Circle or Dr. Army Melia later this week for further evaluation.

## 2018-04-21 ENCOUNTER — Telehealth: Payer: Self-pay | Admitting: Emergency Medicine

## 2018-04-21 NOTE — Telephone Encounter (Signed)
Patient was returning a message left yesterday by Tamala Ser, PA.  Patient was notified that his [place] of his lower leg was Negative and that Tamala Ser, PA recomonneded that he follow-up with his PCP or Cardiologist.  Patient verbalized understanding.

## 2018-04-26 ENCOUNTER — Inpatient Hospital Stay: Payer: Medicare Other

## 2018-04-26 ENCOUNTER — Inpatient Hospital Stay (HOSPITAL_BASED_OUTPATIENT_CLINIC_OR_DEPARTMENT_OTHER): Payer: Medicare Other | Admitting: Internal Medicine

## 2018-04-26 ENCOUNTER — Encounter: Payer: Self-pay | Admitting: Internal Medicine

## 2018-04-26 VITALS — BP 106/56 | HR 82 | Resp 18

## 2018-04-26 VITALS — BP 99/59 | HR 75 | Temp 98.1°F | Resp 16 | Wt 208.6 lb

## 2018-04-26 DIAGNOSIS — E875 Hyperkalemia: Secondary | ICD-10-CM | POA: Diagnosis not present

## 2018-04-26 DIAGNOSIS — C9110 Chronic lymphocytic leukemia of B-cell type not having achieved remission: Secondary | ICD-10-CM | POA: Diagnosis not present

## 2018-04-26 DIAGNOSIS — R5383 Other fatigue: Secondary | ICD-10-CM

## 2018-04-26 DIAGNOSIS — I4891 Unspecified atrial fibrillation: Secondary | ICD-10-CM

## 2018-04-26 DIAGNOSIS — Z5112 Encounter for antineoplastic immunotherapy: Secondary | ICD-10-CM | POA: Diagnosis not present

## 2018-04-26 DIAGNOSIS — Z7901 Long term (current) use of anticoagulants: Secondary | ICD-10-CM

## 2018-04-26 LAB — CBC WITH DIFFERENTIAL/PLATELET
Basophils Absolute: 0 10*3/uL (ref 0–0.1)
Basophils Relative: 0 %
EOS PCT: 0 %
Eosinophils Absolute: 0 10*3/uL (ref 0–0.7)
HCT: 38.4 % — ABNORMAL LOW (ref 40.0–52.0)
Hemoglobin: 12.5 g/dL — ABNORMAL LOW (ref 13.0–18.0)
LYMPHS ABS: 232 10*3/uL — AB (ref 1.0–3.6)
Lymphocytes Relative: 94 %
MCH: 28.7 pg (ref 26.0–34.0)
MCHC: 32.7 g/dL (ref 32.0–36.0)
MCV: 87.8 fL (ref 80.0–100.0)
MONO ABS: 7.4 10*3/uL — AB (ref 0.2–1.0)
Monocytes Relative: 3 %
Neutro Abs: 7.4 10*3/uL — ABNORMAL HIGH (ref 1.4–6.5)
Neutrophils Relative %: 3 %
PLATELETS: 75 10*3/uL — AB (ref 150–440)
RBC: 4.37 MIL/uL — AB (ref 4.40–5.90)
RDW: 15 % — AB (ref 11.5–14.5)
WBC: 246.8 10*3/uL — AB (ref 3.8–10.6)

## 2018-04-26 LAB — COMPREHENSIVE METABOLIC PANEL
ALT: 16 U/L (ref 0–44)
ANION GAP: 10 (ref 5–15)
AST: 33 U/L (ref 15–41)
Albumin: 4.2 g/dL (ref 3.5–5.0)
Alkaline Phosphatase: 66 U/L (ref 38–126)
BUN: 26 mg/dL — ABNORMAL HIGH (ref 8–23)
CHLORIDE: 104 mmol/L (ref 98–111)
CO2: 22 mmol/L (ref 22–32)
Calcium: 8.7 mg/dL — ABNORMAL LOW (ref 8.9–10.3)
Creatinine, Ser: 1.25 mg/dL — ABNORMAL HIGH (ref 0.61–1.24)
GFR, EST NON AFRICAN AMERICAN: 54 mL/min — AB (ref >60)
Glucose, Bld: 123 mg/dL — ABNORMAL HIGH (ref 70–99)
POTASSIUM: 5.3 mmol/L — AB (ref 3.5–5.1)
Sodium: 136 mmol/L (ref 135–145)
Total Bilirubin: 0.5 mg/dL (ref 0.3–1.2)
Total Protein: 6.4 g/dL — ABNORMAL LOW (ref 6.5–8.1)

## 2018-04-26 MED ORDER — SODIUM CHLORIDE 0.9 % IV SOLN
100.0000 mg | Freq: Once | INTRAVENOUS | Status: AC
  Administered 2018-04-26: 100 mg via INTRAVENOUS
  Filled 2018-04-26: qty 4

## 2018-04-26 MED ORDER — SODIUM CHLORIDE 0.9 % IV SOLN
20.0000 mg | Freq: Once | INTRAVENOUS | Status: AC
  Administered 2018-04-26: 20 mg via INTRAVENOUS
  Filled 2018-04-26: qty 2

## 2018-04-26 MED ORDER — SODIUM CHLORIDE 0.9 % IV SOLN
Freq: Once | INTRAVENOUS | Status: AC
  Administered 2018-04-26: 10:00:00 via INTRAVENOUS
  Filled 2018-04-26: qty 1000

## 2018-04-26 MED ORDER — ACETAMINOPHEN 325 MG PO TABS
650.0000 mg | ORAL_TABLET | Freq: Once | ORAL | Status: AC
  Administered 2018-04-26: 650 mg via ORAL
  Filled 2018-04-26: qty 2

## 2018-04-26 MED ORDER — DIPHENHYDRAMINE HCL 50 MG/ML IJ SOLN
50.0000 mg | Freq: Once | INTRAMUSCULAR | Status: AC
Start: 2018-04-26 — End: 2018-04-26
  Administered 2018-04-26: 50 mg via INTRAVENOUS
  Filled 2018-04-26: qty 1

## 2018-04-26 NOTE — Assessment & Plan Note (Addendum)
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  Worsening.   # proceed with cycle #1 Gazyva; labs-increasing white count/hemoglobin stable over 12 platelets stable and 70s.  Potassium elevated at 5.3 (see discussion below).   # reviewed with pt/daughter the side effects- infusion reactions. ; schedule.  Patient has been compliant with steroids Singulair Tylenol Benadryl prior.  #Hyperkalemia potassium 5.3/patient blood pressure borderline; recommend discontinuation of Aldactone at this time.  #History of A. fib on Eliquis [platelets 60s-70s]; easy bruising; continue Eliquis for now. STABLE.   # recommend Infectious prophylaxis: Acyclovir   # 1 week/cbc; in 2 weeks/cbc/traetment/MD.

## 2018-04-26 NOTE — Progress Notes (Signed)
Patient states, "I am having bad pain in the back of my left calf and front of my left leg. It is probably a pain level of 3. I had an ultrasound last week at urgent care and it didn't show a blood clot." Vital signs stable. MD, Dr. [name], notified and aware. MD coming to see patient at chair side. Post Dr. [name] evaluation: patient may be discharged to home at this time.

## 2018-04-26 NOTE — Progress Notes (Signed)
[facility] OFFICE PROGRESS NOTE  Patient Care Team: [name], MD as PCP - General (Internal Medicine) Corey Skains, MD as Consulting Physician (Cardiology) Cammie Sickle, MD as Medical Oncologist (Hematology and Oncology)  Cancer Staging No matching staging information was found for the patient.   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 Surveillance; I[facility]- UNMUTATED [dec 2017]  # AUG 2018- FISH panel analysis was positive for loss of one 13q14 signal. NEGATIVE for CCND1/IGH, ATM, chromosome 12, and TP53 were normal. MAY 2019- : 85% OF NUCLEI POSITIVE FOR A 13Q DELETION  #CHF/CAD s/p Defib- on Eliquis  [Dr.Kowalski]; Orthostatic hypotension   -------------------------------------------------------------   DIAGNOSIS: CLL  STAGE:  IV ;GOALS: CONTROL/palliative  CURRENT/MOST RECENT THERAPY;: July 29th, 2019 GAZYVA      CLL (chronic lymphocytic leukemia) ([place])   04/25/2018 -  Chemotherapy    The patient had obinutuzumab (GAZYVA) 100 mg in sodium chloride 0.9 % 100 mL (0.9615 mg/mL) chemo infusion, 100 mg, Intravenous, Once, 1 of 6 cycles Administration: 100 mg (04/26/2018), 900 mg (04/27/2018)  for chemotherapy treatment.          INTERVAL HISTORY:  Clifford Herrera 77 y.o.  male pleasant patient [name] is here for follow-up.  Patient continues to complain of worsening fatigue.  Also complains of easy bruising.  No falls.  Review of Systems  Constitutional: Positive for malaise/fatigue. Negative for chills, diaphoresis, fever and weight loss.  HENT: Negative for nosebleeds and sore throat.   Eyes: Negative for double vision.  Respiratory: Negative for cough, hemoptysis, sputum production, shortness of breath and wheezing.   Cardiovascular: Negative for chest pain, palpitations,  orthopnea and leg swelling.  Gastrointestinal: Negative for abdominal pain, blood in stool, constipation, diarrhea, heartburn, melena, nausea and vomiting.  Genitourinary: Negative for dysuria, frequency and urgency.  Musculoskeletal: Negative for back pain and joint pain.  Skin: Negative.  Negative for itching and rash.  Neurological: Negative for dizziness, tingling, focal weakness, weakness and headaches.  Endo/Heme/Allergies: Bruises/bleeds easily.  Psychiatric/Behavioral: Negative for depression. The patient is not nervous/anxious and does not have insomnia.       PAST MEDICAL HISTORY :  Past Medical History:  Diagnosis Date  . CLL (chronic lymphocytic leukemia) ([place])   . CPAP (continuous positive airway pressure) dependence   . Heart failure ([place])   . HOH (hard of hearing)   . Presence of automatic (implantable) cardiac defibrillator   . Sleep apnea   . Upper respiratory infection    chronic    PAST SURGICAL HISTORY :   Past Surgical History:  Procedure Laterality Date  . CARDIAC DEFIBRILLATOR PLACEMENT  2015    FAMILY HISTORY :   Family History  Problem Relation Age of Onset  . Diabetes Mother   . CAD Mother   . Diabetes Father   . CAD Father     SOCIAL HISTORY:   Social History   Tobacco Use  . Smoking status: Former Smoker    Packs/day: 2.00    Years: 42.00    Pack years: 84.00    Types: Cigarettes    Last attempt to quit: 1991    Years since quitting: 28.5  . Smokeless tobacco: Current User    Types: Chew  . Tobacco comment: smoking cessation materials not required  Substance Use Topics  . Alcohol use: Yes  Comment: 6 beers per month  . Drug use: No    ALLERGIES:  has No Known Allergies.  MEDICATIONS:  Current Outpatient Medications  Medication Sig Dispense Refill  . acyclovir (ZOVIRAX) 400 MG tablet One pill a day [to prevent shingles] 30 tablet 3  . allopurinol (ZYLOPRIM) 100 MG tablet TAKE 1 TABLET BY MOUTH ONCE EVERY EVENING    .  apixaban (ELIQUIS) 5 MG TABS tablet TAKE 1 TABLET EVERY 12 HOURS    . dexamethasone (DECADRON) 4 MG tablet Take 1 tablet (4 mg total) by mouth daily. Take 1 tablet starting 2 days prior to infusion. Do not take on day of infusion 30 tablet 1  . levothyroxine (SYNTHROID, LEVOTHROID) 75 MCG tablet TAKE 1 TABLET BY MOUTH ONCE DAILY BEFOREBREAKFAST. 90 tablet 3  . metoprolol tartrate (LOPRESSOR) 25 MG tablet Take 25 mg 2 (two) times daily by mouth.    . montelukast (SINGULAIR) 10 MG tablet Take 1 tablet (10 mg total) by mouth at bedtime. Take 1 tablet starting 2 days prior to infusion x 4 days. 30 tablet 2  . rOPINIRole (REQUIP) 0.25 MG tablet Take 2 tablets by mouth at bedtime.    Marland Kitchen spironolactone (ALDACTONE) 25 MG tablet TAKE 1/2 TABLET BY MOUTH ONCE DAILY 45 tablet 3  . tamsulosin (FLOMAX) 0.4 MG CAPS capsule Take 1 capsule (0.4 mg total) by mouth daily after supper. 90 capsule 3  . torsemide (DEMADEX) 20 MG tablet Take 10 mg by mouth daily.     . Vitamin D, Cholecalciferol, 1000 units CAPS Take 2 capsules by mouth daily. 60 capsule    No current facility-administered medications for this visit.     PHYSICAL EXAMINATION: ECOG PERFORMANCE STATUS: 2 - Symptomatic, <50% confined to bed  BP (!) 99/59 (BP Location: Left Arm, Patient Position: Sitting)   Pulse 75   Temp 98.1 F (36.7 C) (Tympanic)   Resp 16   Wt 208 lb 9.6 oz (94.6 kg)   BMI 27.52 kg/m   Filed Weights   04/26/18 0908  Weight: 208 lb 9.6 oz (94.6 kg)    GENERAL: Well-nourished well-developed; Alert, no distress and comfortable.  Accompanied by his daughter.  Marland Kitchen  EYES: no pallor or icterus OROPHARYNX: no thrush or ulceration; NECK: supple; no lymph nodes felt. LYMPH:  no palpable lymphadenopathy in the axillary or inguinal regions LUNGS: Decreased breath sounds auscultation bilaterally. No wheeze or crackles HEART/CVS: regular rate & rhythm and no murmurs; No lower extremity edema ABDOMEN:abdomen soft, non-tender and  normal bowel sounds.  Positive for splenomegaly. Musculoskeletal:no cyanosis of digits and no clubbing  PSYCH: alert & oriented x 3 with fluent speech NEURO: no focal motor/sensory deficits SKIN:  no rashes or significant lesions    LABORATORY DATA:  I have reviewed the data as listed    Component Value Date/Time   NA 136 04/26/2018 0842   NA 137 12/04/2017 1050   NA 136 07/25/2014 0414   K 5.3 (H) 04/26/2018 0842   K 5.6 (H) 07/25/2014 0414   CL 104 04/26/2018 0842   CL 101 07/25/2014 0414   CO2 22 04/26/2018 0842   CO2 24 07/25/2014 0414   GLUCOSE 123 (H) 04/26/2018 0842   GLUCOSE 143 (H) 07/25/2014 0414   BUN 26 (H) 04/26/2018 0842   BUN 15 12/04/2017 1050   BUN 33 (H) 07/25/2014 0414   CREATININE 1.25 (H) 04/26/2018 0842   CREATININE 1.34 (H) 01/15/2015 0956   CALCIUM 8.7 (L) 04/26/2018 0842   CALCIUM 8.3 (L)  07/25/2014 0414   PROT 6.4 (L) 04/26/2018 0842   PROT 5.8 (L) 08/17/2017 1551   PROT 6.4 07/24/2014 0937   ALBUMIN 4.2 04/26/2018 0842   ALBUMIN 4.3 08/17/2017 1551   ALBUMIN 4.0 07/24/2014 0937   AST 33 04/26/2018 0842   AST 31 07/24/2014 0937   ALT 16 04/26/2018 0842   ALT 23 07/24/2014 0937   ALKPHOS 66 04/26/2018 0842   ALKPHOS 53 07/24/2014 0937   BILITOT 0.5 04/26/2018 0842   BILITOT 0.4 08/17/2017 1551   BILITOT 1.1 (H) 07/24/2014 0937   GFRNONAA 54 (L) 04/26/2018 0842   GFRNONAA 52 (L) 01/15/2015 0956   GFRAA >60 04/26/2018 0842   GFRAA >60 01/15/2015 0956    No results found for: SPEP, UPEP  Lab Results  Component Value Date   WBC 246.8 (HH) 04/26/2018   NEUTROABS 7.4 (H) 04/26/2018   HGB 12.5 (L) 04/26/2018   HCT 38.4 (L) 04/26/2018   MCV 87.8 04/26/2018   PLT 75 (L) 04/26/2018      Chemistry      Component Value Date/Time   NA 136 04/26/2018 0842   NA 137 12/04/2017 1050   NA 136 07/25/2014 0414   K 5.3 (H) 04/26/2018 0842   K 5.6 (H) 07/25/2014 0414   CL 104 04/26/2018 0842   CL 101 07/25/2014 0414   CO2 22 04/26/2018 0842    CO2 24 07/25/2014 0414   BUN 26 (H) 04/26/2018 0842   BUN 15 12/04/2017 1050   BUN 33 (H) 07/25/2014 0414   CREATININE 1.25 (H) 04/26/2018 0842   CREATININE 1.34 (H) 01/15/2015 0956      Component Value Date/Time   CALCIUM 8.7 (L) 04/26/2018 0842   CALCIUM 8.3 (L) 07/25/2014 0414   ALKPHOS 66 04/26/2018 0842   ALKPHOS 53 07/24/2014 0937   AST 33 04/26/2018 0842   AST 31 07/24/2014 0937   ALT 16 04/26/2018 0842   ALT 23 07/24/2014 0937   BILITOT 0.5 04/26/2018 0842   BILITOT 0.4 08/17/2017 1551   BILITOT 1.1 (H) 07/24/2014 5993       RADIOGRAPHIC STUDIES: I have personally reviewed the radiological images as listed and agreed with the findings in the report. No results found.   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  Worsening.   # proceed with cycle #1 Gazyva; labs-increasing white count/hemoglobin stable over 12 platelets stable and 70s.  Potassium elevated at 5.3 (see discussion below).   # reviewed with pt/daughter the side effects- infusion reactions. ; schedule.  Patient has been compliant with steroids Singulair Tylenol Benadryl prior.  #Hyperkalemia potassium 5.3/patient blood pressure borderline; recommend discontinuation of Aldactone at this time.  #History of A. fib on Eliquis [platelets 60s-70s]; easy bruising; continue Eliquis for now. STABLE.   # recommend Infectious prophylaxis: Acyclovir   # 1 week/cbc; in 2 weeks/cbc/traetment/MD.    Orders Placed This Encounter  Procedures  . CBC with Differential/Platelet    Standing Status:   Standing    Number of Occurrences:   10    Standing Expiration Date:   04/27/2019  . Comprehensive metabolic panel    Standing Status:   Standing    Number of Occurrences:    10    Standing Expiration Date:   04/27/2019   All questions were answered. The patient knows to call the clinic with  any problems, questions or concerns.      Cammie Sickle, MD 04/27/2018 9:36 PM

## 2018-04-26 NOTE — Patient Instructions (Signed)
Obinutuzumab injection What is this medicine? OBINUTUZUMAB (OH bi nue TOOZ ue mab) is a monoclonal antibody. It is used to treat chronic lymphocytic leukemia (CLL) and a type of non-Hodgkin lymphoma (NHL), follicular lymphoma. This medicine may be used for other purposes; ask your health care provider or pharmacist if you have questions. COMMON BRAND NAME(S): GAZYVA What should I tell my health care provider before I take this medicine? They need to know if you have any of these conditions: -infection (especially a virus infection such as hepatitis B virus) -lung or breathing disease -heart disease -take medicines that treat or prevent blood clots -an unusual or allergic reaction to obinutuzumab, other medicines, foods, dyes, or preservatives -pregnant or trying to get pregnant -breast-feeding How should I use this medicine? This medicine is for infusion into a vein. It is given by a health care professional in a hospital or clinic setting. Talk to your pediatrician regarding the use of this medicine in children. Special care may be needed. Overdosage: If you think you have taken too much of this medicine contact a poison control center or emergency room at once. NOTE: This medicine is only for you. Do not share this medicine with others. What if I miss a dose? Keep appointments for follow-up doses as directed. It is important not to miss your dose. Call your doctor or health care professional if you are unable to keep an appointment. What may interact with this medicine? -live virus vaccines This list may not describe all possible interactions. Give your health care provider a list of all the medicines, herbs, non-prescription drugs, or dietary supplements you use. Also tell them if you smoke, drink alcohol, or use illegal drugs. Some items may interact with your medicine. What should I watch for while using this medicine? Report any side effects that you notice during your treatment right  away, such as changes in your breathing, fever, chills, dizziness or lightheadedness. These effects are more common with the first dose. Visit your prescriber or health care professional for checks on your progress. You will need to have regular blood work. Report any other side effects. The side effects of this medicine can continue after you finish your treatment. Continue your course of treatment even though you feel ill unless your doctor tells you to stop. Call your doctor or health care professional for advice if you get a fever, chills or sore throat, or other symptoms of a cold or flu. Do not treat yourself. This drug decreases your body's ability to fight infections. Try to avoid being around people who are sick. This medicine may increase your risk to bruise or bleed. Call your doctor or health care professional if you notice any unusual bleeding. What side effects may I notice from receiving this medicine? Side effects that you should report to your doctor or health care professional as soon as possible: -allergic reactions like skin rash, itching or hives, swelling of the face, lips, or tongue -breathing problems -changes in vision -chest pain or chest tightness -confusion -dizziness -loss of balance or coordination -low blood counts - this medicine may decrease the number of white blood cells, red blood cells and platelets. You may be at increased risk for infections and bleeding. -signs of decreased platelets or bleeding - bruising, pinpoint red spots on the skin, black, tarry stools, blood in the urine -signs of infection - fever or chills, cough, sore throat, pain or trouble passing urine -signs and symptoms of liver injury like dark yellow or   brown urine; general ill feeling or flu-like symptoms; light-colored stools; loss of appetite; nausea; right upper belly pain; unusually weak or tired; yellowing of the eyes or skin -trouble speaking or understanding -trouble  walking -vomiting Side effects that usually do not require medical attention (report to your doctor or health care professional if they continue or are bothersome): -constipation -joint pain -muscle pain This list may not describe all possible side effects. Call your doctor for medical advice about side effects. You may report side effects to FDA at 1-800-FDA-1088. Where should I keep my medicine? This drug is only given in a hospital or clinic and will not be stored at home. NOTE: This sheet is a summary. It may not cover all possible information. If you have questions about this medicine, talk to your doctor, pharmacist, or health care provider.  2018 Elsevier/Gold Standard (2015-10-18 08:54:03)  

## 2018-04-27 ENCOUNTER — Inpatient Hospital Stay (HOSPITAL_BASED_OUTPATIENT_CLINIC_OR_DEPARTMENT_OTHER): Payer: Medicare Other | Admitting: Nurse Practitioner

## 2018-04-27 ENCOUNTER — Inpatient Hospital Stay: Payer: Medicare Other

## 2018-04-27 ENCOUNTER — Encounter: Payer: Self-pay | Admitting: Nurse Practitioner

## 2018-04-27 VITALS — BP 106/63 | HR 75 | Temp 96.5°F | Resp 18

## 2018-04-27 DIAGNOSIS — Z5112 Encounter for antineoplastic immunotherapy: Secondary | ICD-10-CM | POA: Diagnosis not present

## 2018-04-27 DIAGNOSIS — Z87891 Personal history of nicotine dependence: Secondary | ICD-10-CM

## 2018-04-27 DIAGNOSIS — I480 Paroxysmal atrial fibrillation: Secondary | ICD-10-CM

## 2018-04-27 DIAGNOSIS — I2510 Atherosclerotic heart disease of native coronary artery without angina pectoris: Secondary | ICD-10-CM | POA: Diagnosis not present

## 2018-04-27 DIAGNOSIS — D649 Anemia, unspecified: Secondary | ICD-10-CM

## 2018-04-27 DIAGNOSIS — M7989 Other specified soft tissue disorders: Secondary | ICD-10-CM

## 2018-04-27 DIAGNOSIS — I509 Heart failure, unspecified: Secondary | ICD-10-CM

## 2018-04-27 DIAGNOSIS — I429 Cardiomyopathy, unspecified: Secondary | ICD-10-CM

## 2018-04-27 DIAGNOSIS — C9110 Chronic lymphocytic leukemia of B-cell type not having achieved remission: Secondary | ICD-10-CM | POA: Diagnosis not present

## 2018-04-27 DIAGNOSIS — N183 Chronic kidney disease, stage 3 (moderate): Secondary | ICD-10-CM

## 2018-04-27 DIAGNOSIS — Z7901 Long term (current) use of anticoagulants: Secondary | ICD-10-CM

## 2018-04-27 DIAGNOSIS — J449 Chronic obstructive pulmonary disease, unspecified: Secondary | ICD-10-CM

## 2018-04-27 MED ORDER — SODIUM CHLORIDE 0.9 % IV SOLN
20.0000 mg | Freq: Once | INTRAVENOUS | Status: AC
  Administered 2018-04-27: 20 mg via INTRAVENOUS
  Filled 2018-04-27: qty 2

## 2018-04-27 MED ORDER — DIPHENHYDRAMINE HCL 50 MG/ML IJ SOLN
50.0000 mg | Freq: Once | INTRAMUSCULAR | Status: AC
  Administered 2018-04-27: 50 mg via INTRAVENOUS
  Filled 2018-04-27: qty 1

## 2018-04-27 MED ORDER — SODIUM CHLORIDE 0.9 % IV SOLN
900.0000 mg | Freq: Once | INTRAVENOUS | Status: AC
  Administered 2018-04-27: 900 mg via INTRAVENOUS
  Filled 2018-04-27: qty 36

## 2018-04-27 MED ORDER — ACETAMINOPHEN 325 MG PO TABS
650.0000 mg | ORAL_TABLET | Freq: Once | ORAL | Status: AC
  Administered 2018-04-27: 650 mg via ORAL
  Filled 2018-04-27: qty 2

## 2018-04-27 MED ORDER — SODIUM CHLORIDE 0.9 % IV SOLN
Freq: Once | INTRAVENOUS | Status: AC
  Administered 2018-04-27: 10:00:00 via INTRAVENOUS
  Filled 2018-04-27: qty 1000

## 2018-04-27 NOTE — Progress Notes (Signed)
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 Surveillance; [facility]- UNMUTATED [dec 2017]  # AUG 2018- FISH panel analysis was positive for loss of one 13q14 signal. NEGATIVE for CCND1/IGH, ATM, chromosome 12, and TP53 were normal. MAY 2019- : 85% OF NUCLEI POSITIVE FOR A 13Q DELETION  #CHF/CAD s/p Defib- on Eliquis  [Dr.Kowalski]; Orthostatic hypotension   -------------------------------------------------------------   DIAGNOSIS: CLL  STAGE:  IV ;GOALS: CONTROL/palliative  CURRENT/MOST RECENT THERAPY;: July 29th, 2019 GAZYVA      CLL (chronic lymphocytic leukemia) ([place])   04/25/2018 -  Chemotherapy    The patient had obinutuzumab (GAZYVA) 100 mg in sodium chloride 0.9 % 100 mL (0.9615 mg/mL) chemo infusion, 100 mg, Intravenous, Once, 1 of 6 cycles Administration: 100 mg (04/26/2018), 900 mg (04/27/2018)  for chemotherapy treatment.         Interval history-  Clifford Herrera, 77 year old male, who presents to chemo care clinic today for initial meeting in preparation for starting chemotherapy. I introduced the chemo care clinic and we discussed that the role of the clinic is to assist those who are at an increased risk of emergency room visits and/or complications  during the course of chemotherapy treatment. We discussed that the increased risk takes into account factors such as age, performance status, and co-morbidities. We also discussed that for some, this might include barriers to care such as not having a primary care provider, lack of insurance/transportation, or not being able to afford medications. We discussed that the goal of the program is to help prevent unplanned ER visits and help reduce complications during chemotherapy. We do this by discussing specific risk factors to each individual and identifying ways that we can help improve these risk factors and reduce barriers to care.  We discussed that he specifically was identified as high risk based on recent ER visit, age, prior history of anemia, A. Fib (paroxysmal, on Eliquis anticoagulation, metoprolol for rate control; no recent episodes and denies adverse bleeding.  Does have easy bruising), CVD, CKD, COPD, and CHF.  He states that he has established care with primary care doctor, Dr. Army Melia.  He denies having trouble with transportation.  Denies having trouble affording medications currently but fears this may become difficult in the future.  He identifies his wife and children as his primary support system.    ECOG FS:1 - Symptomatic but completely ambulatory  Review of systems- Review of Systems  Constitutional: Positive for malaise/fatigue. Negative for chills, fever and weight loss.  HENT: Negative for congestion, ear discharge, ear pain, sinus pain, sore throat and tinnitus.   Eyes: Negative.   Respiratory: Negative.  Negative for cough, sputum production and shortness of breath.   Cardiovascular: Negative for chest pain, palpitations, orthopnea, claudication and leg swelling.  Gastrointestinal: Negative for abdominal pain, blood in stool, constipation, diarrhea, heartburn, nausea and vomiting.  Genitourinary: Negative.  Musculoskeletal: Negative.   Skin: Negative.   Neurological:  Negative for dizziness, tingling, weakness and headaches.  Endo/Heme/Allergies: Bruises/bleeds easily.  Psychiatric/Behavioral: Negative.      Current treatment- Gazyva (starting 04/27/18)  No Known Allergies   Past Medical History:  Diagnosis Date  . CLL (chronic lymphocytic leukemia) ([place])   . CPAP (continuous positive airway pressure) dependence   . Heart failure ([place])   . HOH (hard of hearing)   . Presence of automatic (implantable) cardiac defibrillator   . Sleep apnea   . Upper respiratory infection    chronic     Past Surgical History:  Procedure Laterality Date  . CARDIAC DEFIBRILLATOR PLACEMENT  2015    Social History   Socioeconomic History  . Marital status: Married    Spouse name: Not on file  . Number of children: 3  . Years of education: Not on file  . Highest education level: Bachelor's degree (e.g., BA, AB, BS)  Occupational History  . Occupation: Retired  Scientific laboratory technician  . Financial resource strain: Not hard at all  . Food insecurity:    Worry: Never true    Inability: Never true  . Transportation needs:    Medical: No    Non-medical: No  Tobacco Use  . Smoking status: Former Smoker    Packs/day: 2.00    Years: 42.00    Pack years: 84.00    Types: Cigarettes    Last attempt to quit: 1991    Years since quitting: 28.5  . Smokeless tobacco: Current User    Types: Chew  . Tobacco comment: smoking cessation materials not required  Substance and Sexual Activity  . Alcohol use: Yes    Comment: 6 beers per month  . Drug use: No  . Sexual activity: Not Currently    Comment: quit about 20 years ago  Lifestyle  . Physical activity:    Days per week: 0 days    Minutes per session: 0 min  . Stress: Not at all  Relationships  . Social connections:    Talks on phone: Patient refused    Gets together: Patient refused    Attends religious service: Patient refused    Active member of club or organization: Patient refused    Attends meetings of  clubs or organizations: Patient refused    Relationship status: Patient refused  . Intimate partner violence:    Fear of current or ex partner: No    Emotionally abused: No    Physically abused: No    Forced sexual activity: No  Other Topics Concern  . Not on file  Social History Narrative  . Not on file    Family History  Problem Relation Age of Onset  . Diabetes Mother   . CAD Mother   . Diabetes Father   . CAD Father      Current Outpatient Medications:  .  acyclovir (ZOVIRAX) 400 MG tablet, One pill a day [to prevent shingles], Disp: 30 tablet, Rfl: 3 .  allopurinol (ZYLOPRIM) 100 MG tablet, TAKE 1 TABLET BY MOUTH ONCE EVERY EVENING, Disp: , Rfl:  .  apixaban (ELIQUIS) 5 MG TABS tablet, TAKE 1 TABLET EVERY 12 HOURS, Disp: , Rfl:  .  dexamethasone (DECADRON) 4 MG tablet, Take 1 tablet (4 mg total) by mouth daily. Take 1 tablet starting 2 days prior to infusion. Do not take on day of infusion, Disp: 30 tablet, Rfl: 1 .  levothyroxine (SYNTHROID, LEVOTHROID) 75 MCG tablet, TAKE 1 TABLET BY MOUTH  ONCE DAILY BEFOREBREAKFAST., Disp: 90 tablet, Rfl: 3 .  metoprolol tartrate (LOPRESSOR) 25 MG tablet, Take 25 mg 2 (two) times daily by mouth., Disp: , Rfl:  .  montelukast (SINGULAIR) 10 MG tablet, Take 1 tablet (10 mg total) by mouth at bedtime. Take 1 tablet starting 2 days prior to infusion x 4 days., Disp: 30 tablet, Rfl: 2 .  rOPINIRole (REQUIP) 0.25 MG tablet, Take 2 tablets by mouth at bedtime., Disp: , Rfl:  .  spironolactone (ALDACTONE) 25 MG tablet, TAKE 1/2 TABLET BY MOUTH ONCE DAILY, Disp: 45 tablet, Rfl: 3 .  tamsulosin (FLOMAX) 0.4 MG CAPS capsule, Take 1 capsule (0.4 mg total) by mouth daily after supper., Disp: 90 capsule, Rfl: 3 .  torsemide (DEMADEX) 20 MG tablet, Take 10 mg by mouth daily. , Disp: , Rfl:  .  Vitamin D, Cholecalciferol, 1000 units CAPS, Take 2 capsules by mouth daily., Disp: 60 capsule, Rfl:   Physical exam: There were no vitals filed for this  visit. GENERAL: Elderly, well-developed and well-nourished.  No acute distress.  Accompanied by wife.  Seen in infusion during first chemotherapy treatment HEENT: Normocephalic, PERRL, neck supple LUNGS: Decreased breath sounds HEART: Systolic murmur, 2/6.  Regular rate and rhythm ABDOMEN: Rounded, soft MSK: Ambulatory without aids SKIN: Warm, dry, several bruises in various stages of healing.  NEURO:  Nonfocal. Well oriented. Appropriate affect.  Fatigued appearing   CMP Latest Ref Rng & Units 04/26/2018  Glucose 70 - 99 mg/dL 123(H)  BUN 8 - 23 mg/dL 26(H)  Creatinine 0.61 - 1.24 mg/dL 1.25(H)  Sodium 135 - 145 mmol/L 136  Potassium 3.5 - 5.1 mmol/L 5.3(H)  Chloride 98 - 111 mmol/L 104  CO2 22 - 32 mmol/L 22  Calcium 8.9 - 10.3 mg/dL 8.7(L)  Total Protein 6.5 - 8.1 g/dL 6.4(L)  Total Bilirubin 0.3 - 1.2 mg/dL 0.5  Alkaline Phos 38 - 126 U/L 66  AST 15 - 41 U/L 33  ALT 0 - 44 U/L 16   CBC Latest Ref Rng & Units 04/26/2018  WBC 3.8 - 10.6 K/uL 246.8(HH)  Hemoglobin 13.0 - 18.0 g/dL 12.5(L)  Hematocrit 40.0 - 52.0 % 38.4(L)  Platelets 150 - 440 K/uL 75(L)    No images are attached to the encounter.  US Venous Img Lower Unilateral Left  Result Date: 04/20/2018 CLINICAL DATA:  Left lower extremity pain and swelling EXAM: LEFT LOWER EXTREMITY VENOUS DUPLEX ULTRASOUND TECHNIQUE: Doppler venous assessment of the left lower extremity deep venous system was performed, including characterization of spectral flow, compressibility, and phasicity. COMPARISON:  None. FINDINGS: There is complete compressibility of the left common femoral, femoral, and popliteal veins. Doppler analysis demonstrates respiratory phasicity and augmentation of flow with calf compression. No obvious superficial vein or calf vein thrombosis. There is subcutaneous edema at the dorsum of the left foot. IMPRESSION: No evidence of left lower extremity DVT. Electronically Signed   By: Marybelle Killings M.D.   On: 04/20/2018  16:44     Assessment and plan- Patient is a 77 y.o. male who presents to chemo care clinic for initial meeting in preparation for starting chemotherapy for CLL.    1. CLL-primary oncologist, Dr. [name].  Stage IV. 13q deletion/I GVH unmutated.  April 2019 PET scan showed massive splenomegaly, mild progression of cervical, axillary, mediastinal, retroperitoneal, and periportal adenopathy.  Worsening white counts and fatigue.  Hmg stable. Starting [facility] today; given with palliative intent.  Receiving acyclovir for prophylaxis.   2. Chemo Care Clinic/High Risk  for ER/Hospitalization during chemotherapy- We discussed the role of the chemo care clinic and identified patient specific risk factors. I discussed that patient was identified as high risk primarily based on: Recent ER admission, age/Medicare status, history of anemia, A. fib, CVD, COPD, CKD.  Overall he reports financial stability and denies having trouble affording medications currently although he and his wife do endorse some concern for the future.  They deny having trouble finding transportation to appointments including those on short notice.  He has primary care doctor whom he sees regularly. We also discussed the role of the Sympt[facility] at [facility] and methods of contacting clinic/provider.  His wife reports significant anxiety coping with diagnosis and upcoming treatments.  She states that she is emotionally overwhelmed.  We discussed counseling services available to patients and their families and was provided with contact information for [facility], LCSW.  Otherwise, He denies needing specific assistance at this time.  Encouraged compliance and follow-up with his care team regarding his multiple comorbidities (see below).   3. COPD- no recent exacerbations, but episode of bronchitis in June.  Not currently on maintenance medications but has taken Spiriva in the past.  Is previously seen Dr. Raul Del with pulmonology but last  visit 02/21/2015 per chart review. Recommend patient to follow-up.   4. CKD- stage 3.  Encourage continued increased intake of oral fluids and importance while receiving treatment. Cr 1.25 yesterday. Managed by PCP (previously saw Dr. Holley Raring). Continue to monitor labs.   5. CHF/CAD/A fib- history of CHF with cardiomyopathy, s/p implanted defibrillator. Dr. Kowalski/cardiology.  No recent exacerbations.  On several medications including beta-blocker, Eliquis (platelets 60s-70s and easy bruising.  Per Dr. [name], continue Eliquis at this time).  Follow-up with Dr. Nehemiah Massed as scheduled on/around 04/2018.   6. Leg swelling-recent ER visit for left leg swelling.  Ultrasound was negative for DVT and supportive treatment was recommended including ambulation, and elevation.  Was recommended that he follow-up with cardiology for management.  No acute symptoms today.  Continue to monitor during treatment.   7. Anemia- Hmg 12.5. Stable.  Continue to monitor during treatment.  rtc as scheduled on 8/5-19 for labs and possible fluids per Dr. [name]  Visit Diagnosis 1. CLL (chronic lymphocytic leukemia) ([place])    Patient expressed understanding and was in agreement with this plan. He also understands that He can call clinic at any time with any questions, concerns, or complaints.   A total of (25) minutes of face-to-face time was spent with this patient with greater than 50% of that time in counseling and care-coordination.  Thank you for allowing me to participate in the care of this very pleasant patient.  Beckey Rutter, DNP, AGNP-C [place] [facility] [phone] (work cell) [phone] (office) 04/27/18 4:17 PM

## 2018-05-03 ENCOUNTER — Inpatient Hospital Stay: Payer: Medicare Other | Attending: Internal Medicine

## 2018-05-03 ENCOUNTER — Inpatient Hospital Stay: Payer: Medicare Other

## 2018-05-03 ENCOUNTER — Other Ambulatory Visit: Payer: Self-pay

## 2018-05-03 VITALS — BP 116/70 | HR 71 | Temp 96.0°F | Resp 18 | Wt 208.8 lb

## 2018-05-03 DIAGNOSIS — Z7901 Long term (current) use of anticoagulants: Secondary | ICD-10-CM | POA: Insufficient documentation

## 2018-05-03 DIAGNOSIS — C9110 Chronic lymphocytic leukemia of B-cell type not having achieved remission: Secondary | ICD-10-CM | POA: Insufficient documentation

## 2018-05-03 DIAGNOSIS — R5383 Other fatigue: Secondary | ICD-10-CM | POA: Diagnosis not present

## 2018-05-03 DIAGNOSIS — Z87891 Personal history of nicotine dependence: Secondary | ICD-10-CM | POA: Diagnosis not present

## 2018-05-03 DIAGNOSIS — M545 Low back pain: Secondary | ICD-10-CM | POA: Insufficient documentation

## 2018-05-03 DIAGNOSIS — I4891 Unspecified atrial fibrillation: Secondary | ICD-10-CM | POA: Diagnosis not present

## 2018-05-03 LAB — CBC WITH DIFFERENTIAL/PLATELET
Basophils Absolute: 0 10*3/uL (ref 0–0.1)
Basophils Relative: 1 %
EOS ABS: 0 10*3/uL (ref 0–0.7)
EOS PCT: 1 %
HCT: 36.2 % — ABNORMAL LOW (ref 40.0–52.0)
HEMOGLOBIN: 12.3 g/dL — AB (ref 13.0–18.0)
LYMPHS ABS: 1.7 10*3/uL (ref 1.0–3.6)
LYMPHS PCT: 43 %
MCH: 29.4 pg (ref 26.0–34.0)
MCHC: 34 g/dL (ref 32.0–36.0)
MCV: 86.4 fL (ref 80.0–100.0)
MONOS PCT: 3 %
Monocytes Absolute: 0.1 10*3/uL — ABNORMAL LOW (ref 0.2–1.0)
Neutro Abs: 2.1 10*3/uL (ref 1.4–6.5)
Neutrophils Relative %: 52 %
PLATELETS: 36 10*3/uL — AB (ref 150–440)
RBC: 4.19 MIL/uL — ABNORMAL LOW (ref 4.40–5.90)
RDW: 15.1 % — ABNORMAL HIGH (ref 11.5–14.5)
WBC: 4.1 10*3/uL (ref 3.8–10.6)

## 2018-05-03 LAB — COMPREHENSIVE METABOLIC PANEL
ALT: 15 U/L (ref 0–44)
AST: 21 U/L (ref 15–41)
Albumin: 3.7 g/dL (ref 3.5–5.0)
Alkaline Phosphatase: 44 U/L (ref 38–126)
Anion gap: 7 (ref 5–15)
BILIRUBIN TOTAL: 0.7 mg/dL (ref 0.3–1.2)
BUN: 20 mg/dL (ref 8–23)
CHLORIDE: 107 mmol/L (ref 98–111)
CO2: 21 mmol/L — ABNORMAL LOW (ref 22–32)
Calcium: 8.1 mg/dL — ABNORMAL LOW (ref 8.9–10.3)
Creatinine, Ser: 1.04 mg/dL (ref 0.61–1.24)
GFR calc Af Amer: >60 mL/min (ref >60)
Glucose, Bld: 105 mg/dL — ABNORMAL HIGH (ref 70–99)
POTASSIUM: 4.7 mmol/L (ref 3.5–5.1)
Sodium: 135 mmol/L (ref 135–145)
TOTAL PROTEIN: 5.6 g/dL — AB (ref 6.5–8.1)

## 2018-05-03 MED ORDER — ACETAMINOPHEN 325 MG PO TABS: 650.0000 mg | ORAL_TABLET | Freq: Once | ORAL | Status: DC

## 2018-05-03 MED ORDER — SODIUM CHLORIDE 0.9 % IV SOLN
20.0000 mg | Freq: Once | INTRAVENOUS | Status: DC
  Filled 2018-05-03: qty 2

## 2018-05-03 MED ORDER — SODIUM CHLORIDE 0.9 % IV SOLN
1000.0000 mg | Freq: Once | INTRAVENOUS | Status: DC
  Filled 2018-05-03: qty 40

## 2018-05-03 MED ORDER — SODIUM CHLORIDE 0.9 % IV SOLN
Freq: Once | INTRAVENOUS | Status: DC
  Filled 2018-05-03: qty 1000

## 2018-05-03 MED ORDER — DIPHENHYDRAMINE HCL 50 MG/ML IJ SOLN: 50.0000 mg | Freq: Once | INTRAMUSCULAR | Status: DC

## 2018-05-03 NOTE — Progress Notes (Signed)
Platelet count noted to be 36K today.  Treatment to be held today per Dr. [name] due to patient being on blood thinners.  Pt and wife educated to call should pt develop any active bleeding at home, verbalized understanding, questions answered.

## 2018-05-10 ENCOUNTER — Inpatient Hospital Stay: Payer: Medicare Other

## 2018-05-10 ENCOUNTER — Inpatient Hospital Stay (HOSPITAL_BASED_OUTPATIENT_CLINIC_OR_DEPARTMENT_OTHER): Payer: Medicare Other | Admitting: Internal Medicine

## 2018-05-10 VITALS — BP 115/68 | HR 66 | Temp 97.5°F | Resp 16 | Wt 215.4 lb

## 2018-05-10 DIAGNOSIS — C9110 Chronic lymphocytic leukemia of B-cell type not having achieved remission: Secondary | ICD-10-CM

## 2018-05-10 DIAGNOSIS — I4891 Unspecified atrial fibrillation: Secondary | ICD-10-CM

## 2018-05-10 DIAGNOSIS — R5383 Other fatigue: Secondary | ICD-10-CM | POA: Diagnosis not present

## 2018-05-10 DIAGNOSIS — Z7901 Long term (current) use of anticoagulants: Secondary | ICD-10-CM

## 2018-05-10 DIAGNOSIS — Z87891 Personal history of nicotine dependence: Secondary | ICD-10-CM

## 2018-05-10 LAB — CBC WITH DIFFERENTIAL/PLATELET
BASOS ABS: 0 10*3/uL (ref 0–0.1)
Basophils Relative: 0 %
EOS ABS: 0 10*3/uL (ref 0–0.7)
Eosinophils Relative: 0 %
HEMATOCRIT: 35.2 % — AB (ref 40.0–52.0)
HEMOGLOBIN: 11.9 g/dL — AB (ref 13.0–18.0)
Lymphocytes Relative: 23 %
Lymphs Abs: 0.7 10*3/uL — ABNORMAL LOW (ref 1.0–3.6)
MCH: 29 pg (ref 26.0–34.0)
MCHC: 33.8 g/dL (ref 32.0–36.0)
MCV: 85.9 fL (ref 80.0–100.0)
Monocytes Absolute: 0.1 10*3/uL — ABNORMAL LOW (ref 0.2–1.0)
Monocytes Relative: 2 %
NEUTROS ABS: 2.3 10*3/uL (ref 1.4–6.5)
NEUTROS PCT: 75 %
Platelets: 40 10*3/uL — ABNORMAL LOW (ref 150–440)
RBC: 4.1 MIL/uL — AB (ref 4.40–5.90)
RDW: 15.7 % — ABNORMAL HIGH (ref 11.5–14.5)
WBC: 3.2 10*3/uL — AB (ref 3.8–10.6)

## 2018-05-10 LAB — COMPREHENSIVE METABOLIC PANEL
ALT: 16 U/L (ref 0–44)
ANION GAP: 8 (ref 5–15)
AST: 22 U/L (ref 15–41)
Albumin: 3.6 g/dL (ref 3.5–5.0)
Alkaline Phosphatase: 35 U/L — ABNORMAL LOW (ref 38–126)
BUN: 17 mg/dL (ref 8–23)
CHLORIDE: 105 mmol/L (ref 98–111)
CO2: 22 mmol/L (ref 22–32)
Calcium: 8.6 mg/dL — ABNORMAL LOW (ref 8.9–10.3)
Creatinine, Ser: 0.9 mg/dL (ref 0.61–1.24)
Glucose, Bld: 165 mg/dL — ABNORMAL HIGH (ref 70–99)
Potassium: 4.9 mmol/L (ref 3.5–5.1)
Sodium: 135 mmol/L (ref 135–145)
Total Bilirubin: 0.8 mg/dL (ref 0.3–1.2)
Total Protein: 5.6 g/dL — ABNORMAL LOW (ref 6.5–8.1)

## 2018-05-10 MED ORDER — ROPINIROLE HCL 0.25 MG PO TABS: 0.5000 mg | ORAL_TABLET | Freq: Every day | ORAL | 0 refills | Status: DC

## 2018-05-10 NOTE — Progress Notes (Signed)
[facility] OFFICE PROGRESS NOTE  Patient Care Team: [name], MD as PCP - General (Internal Medicine) Corey Skains, MD as Consulting Physician (Cardiology) Cammie Sickle, MD as Medical Oncologist (Hematology and Oncology)  Cancer Staging No matching staging information was found for the patient.   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 Surveillance; [facility]- UNMUTATED [dec 2017]  # AUG 2018- FISH panel analysis was positive for loss of one 13q14 signal. NEGATIVE for CCND1/IGH, ATM, chromosome 12, and TP53 were normal. MAY 2019- : 85% OF NUCLEI POSITIVE FOR A 13Q DELETION  #CHF/CAD s/p Defib- on Eliquis  [Dr.Kowalski]; Orthostatic hypotension   -------------------------------------------------------------   DIAGNOSIS: CLL  STAGE:  IV ;GOALS: CONTROL/palliative  CURRENT/MOST RECENT THERAPY;: July 29th, 2019 GAZYVA      CLL (chronic lymphocytic leukemia) ([place])   04/25/2018 -  Chemotherapy    The patient had obinutuzumab (GAZYVA) 100 mg in sodium chloride 0.9 % 100 mL (0.9615 mg/mL) chemo infusion, 100 mg, Intravenous, Once, 1 of 6 cycles Administration: 100 mg (04/26/2018), 900 mg (04/27/2018)  for chemotherapy treatment.        INTERVAL HISTORY:  Clifford Herrera 77 y.o.  male pleasant patient [name] currently on [name] is here for follow-up.  Patient continues to complain of easy bruising and bleeding minimal trauma.  There is no gum bleeding or nosebleeds.  Appetite is fair.  Positive for mild to moderate fatigue.  Review of Systems  Constitutional: Positive for malaise/fatigue. Negative for chills, diaphoresis and fever.  HENT: Negative for nosebleeds and sore throat.   Eyes: Negative for double vision.  Respiratory: Negative for cough, hemoptysis, sputum production,  shortness of breath and wheezing.   Cardiovascular: Negative for chest pain, palpitations, orthopnea and leg swelling.  Gastrointestinal: Negative for abdominal pain, blood in stool, constipation, diarrhea, heartburn, melena, nausea and vomiting.  Genitourinary: Negative for dysuria, frequency and urgency.  Musculoskeletal: Negative for back pain and joint pain.  Skin: Negative.  Negative for itching and rash.  Neurological: Negative for dizziness, tingling, focal weakness, weakness and headaches.  Endo/Heme/Allergies: Bruises/bleeds easily.  Psychiatric/Behavioral: Negative for depression. The patient is not nervous/anxious and does not have insomnia.       PAST MEDICAL HISTORY :  Past Medical History:  Diagnosis Date  . CLL (chronic lymphocytic leukemia) ([place])   . CPAP (continuous positive airway pressure) dependence   . Heart failure ([place])   . HOH (hard of hearing)   . Presence of automatic (implantable) cardiac defibrillator   . Sleep apnea   . Upper respiratory infection    chronic    PAST SURGICAL HISTORY :   Past Surgical History:  Procedure Laterality Date  . CARDIAC DEFIBRILLATOR PLACEMENT  2015    FAMILY HISTORY :   Family History  Problem Relation Age of Onset  . Diabetes Mother   . CAD Mother   . Diabetes Father   . CAD Father     SOCIAL HISTORY:   Social History   Tobacco Use  . Smoking status: Former Smoker    Packs/day: 2.00    Years: 42.00    Pack years: 84.00    Types: Cigarettes    Last attempt to quit: 1991    Years since quitting: 28.6  . Smokeless tobacco: Current User    Types: Chew  . Tobacco comment: smoking cessation materials  not required  Substance Use Topics  . Alcohol use: Yes    Comment: 6 beers per month  . Drug use: No    ALLERGIES:  has No Known Allergies.  MEDICATIONS:  Current Outpatient Medications  Medication Sig Dispense Refill  . acyclovir (ZOVIRAX) 400 MG tablet One pill a day [to prevent shingles] 30 tablet 3   . allopurinol (ZYLOPRIM) 100 MG tablet TAKE 1 TABLET BY MOUTH ONCE EVERY EVENING    . apixaban (ELIQUIS) 5 MG TABS tablet TAKE 1 TABLET EVERY 12 HOURS    . dexamethasone (DECADRON) 4 MG tablet Take 1 tablet (4 mg total) by mouth daily. Take 1 tablet starting 2 days prior to infusion. Do not take on day of infusion 30 tablet 1  . levothyroxine (SYNTHROID, LEVOTHROID) 75 MCG tablet TAKE 1 TABLET BY MOUTH ONCE DAILY BEFOREBREAKFAST. 90 tablet 3  . metoprolol tartrate (LOPRESSOR) 25 MG tablet Take 25 mg 2 (two) times daily by mouth.    . montelukast (SINGULAIR) 10 MG tablet Take 1 tablet (10 mg total) by mouth at bedtime. Take 1 tablet starting 2 days prior to infusion x 4 days. 30 tablet 2  . rOPINIRole (REQUIP) 0.25 MG tablet Take 2 tablets (0.5 mg total) by mouth at bedtime. 60 tablet 0  . spironolactone (ALDACTONE) 25 MG tablet TAKE 1/2 TABLET BY MOUTH ONCE DAILY 45 tablet 3  . tamsulosin (FLOMAX) 0.4 MG CAPS capsule Take 1 capsule (0.4 mg total) by mouth daily after supper. 90 capsule 3  . torsemide (DEMADEX) 20 MG tablet Take 10 mg by mouth daily.     . Vitamin D, Cholecalciferol, 1000 units CAPS Take 2 capsules by mouth daily. 60 capsule    No current facility-administered medications for this visit.     PHYSICAL EXAMINATION: ECOG PERFORMANCE STATUS: 1 - Symptomatic but completely ambulatory  BP 115/68 (BP Location: Left Arm, Patient Position: Sitting)   Pulse 66   Temp (!) 97.5 F (36.4 C) (Tympanic)   Resp 16   Wt 215 lb 6.4 oz (97.7 kg)   BMI 28.42 kg/m   Filed Weights   05/10/18 0842  Weight: 215 lb 6.4 oz (97.7 kg)    GENERAL: Well-nourished well-developed; Alert, no distress and comfortable.  Accompanied by his wife. EYES: no pallor or icterus OROPHARYNX: no thrush or ulceration; NECK: supple; no lymph nodes felt. LYMPH:  no palpable lymphadenopathy in the axillary or inguinal regions LUNGS: Decreased breath sounds auscultation bilaterally. No wheeze or  crackles HEART/CVS: regular rate & rhythm and no murmurs; No lower extremity edema ABDOMEN:abdomen soft, non-tender and normal bowel sounds. No hepatomegaly or splenomegaly.  Musculoskeletal:no cyanosis of digits and no clubbing  PSYCH: alert & oriented x 3 with fluent speech NEURO: no focal motor/sensory deficits SKIN: Multiple chronic bruises.    LABORATORY DATA:  I have reviewed the data as listed    Component Value Date/Time   NA 135 05/24/2018 0809   NA 137 12/04/2017 1050   NA 136 07/25/2014 0414   K 4.9 05/24/2018 0809   K 5.6 (H) 07/25/2014 0414   CL 104 05/24/2018 0809   CL 101 07/25/2014 0414   CO2 21 (L) 05/24/2018 0809   CO2 24 07/25/2014 0414   GLUCOSE 134 (H) 05/24/2018 0809   GLUCOSE 143 (H) 07/25/2014 0414   BUN 17 05/24/2018 0809   BUN 15 12/04/2017 1050   BUN 33 (H) 07/25/2014 0414   CREATININE 1.05 05/24/2018 0809   CREATININE 1.34 (H) 01/15/2015 0938  CALCIUM 8.6 (L) 05/24/2018 0809   CALCIUM 8.3 (L) 07/25/2014 0414   PROT 6.4 (L) 05/24/2018 0809   PROT 5.8 (L) 08/17/2017 1551   PROT 6.4 07/24/2014 0937   ALBUMIN 4.2 05/24/2018 0809   ALBUMIN 4.3 08/17/2017 1551   ALBUMIN 4.0 07/24/2014 0937   AST 20 05/24/2018 0809   AST 31 07/24/2014 0937   ALT 16 05/24/2018 0809   ALT 23 07/24/2014 0937   ALKPHOS 42 05/24/2018 0809   ALKPHOS 53 07/24/2014 0937   BILITOT 0.8 05/24/2018 0809   BILITOT 0.4 08/17/2017 1551   BILITOT 1.1 (H) 07/24/2014 0937   GFRNONAA >60 05/24/2018 0809   GFRNONAA 52 (L) 01/15/2015 0956   GFRAA >60 05/24/2018 0809   GFRAA >60 01/15/2015 0956    No results found for: SPEP, UPEP  Lab Results  Component Value Date   WBC 6.8 05/24/2018   NEUTROABS 3.7 05/24/2018   HGB 13.7 05/24/2018   HCT 41.0 05/24/2018   MCV 85.5 05/24/2018   PLT 42 (L) 05/24/2018      Chemistry      Component Value Date/Time   NA 135 05/24/2018 0809   NA 137 12/04/2017 1050   NA 136 07/25/2014 0414   K 4.9 05/24/2018 0809   K 5.6 (H)  07/25/2014 0414   CL 104 05/24/2018 0809   CL 101 07/25/2014 0414   CO2 21 (L) 05/24/2018 0809   CO2 24 07/25/2014 0414   BUN 17 05/24/2018 0809   BUN 15 12/04/2017 1050   BUN 33 (H) 07/25/2014 0414   CREATININE 1.05 05/24/2018 0809   CREATININE 1.34 (H) 01/15/2015 0956      Component Value Date/Time   CALCIUM 8.6 (L) 05/24/2018 0809   CALCIUM 8.3 (L) 07/25/2014 0414   ALKPHOS 42 05/24/2018 0809   ALKPHOS 53 07/24/2014 0937   AST 20 05/24/2018 0809   AST 31 07/24/2014 0937   ALT 16 05/24/2018 0809   ALT 23 07/24/2014 0937   BILITOT 0.8 05/24/2018 0809   BILITOT 0.4 08/17/2017 1551   BILITOT 1.1 (H) 07/24/2014 0321       RADIOGRAPHIC STUDIES: I have personally reviewed the radiological images as listed and agreed with the findings in the report. No results found.   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  STABLE.  # s/p Gazyva cycle #1 day2; day-8 held sec to severe thrombocytopenia [36].  Today platlets- 46; HOLD again today.   # History of A. fib on Eliquis [platelets 30-40s]; easy bruising; Take Eliquis 5 mg BID sa per cardiology.   # recommend Infectious prophylaxis: Acyclovir ; continue allopurinol.   #  in 2 weeks/cbc/treatment/MD.    No orders of the defined types were placed in this encounter.  All questions were answered. The patient knows to call the clinic with any problems, questions or concerns.      Cammie Sickle, MD 05/24/2018 8:49 PM

## 2018-05-10 NOTE — Assessment & Plan Note (Addendum)
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  STABLE.  # s/p Gazyva cycle #1 day2; day-8 held sec to severe thrombocytopenia [36].  Today platlets- 62; HOLD again today.   # History of A. fib on Eliquis [platelets 30-40s]; easy bruising; Take Eliquis 5 mg BID sa per cardiology.   # recommend Infectious prophylaxis: Acyclovir ; continue allopurinol.   #  in 2 weeks/cbc/treatment/MD.

## 2018-05-21 ENCOUNTER — Telehealth: Payer: Self-pay | Admitting: *Deleted

## 2018-05-21 NOTE — Telephone Encounter (Signed)
Contacted Allisen Pidgeon RN and requested pt original cancer dx date in 2005. Reviewed chart-old records. Original dx date 12/29/2003

## 2018-05-24 ENCOUNTER — Inpatient Hospital Stay: Payer: Medicare Other

## 2018-05-24 ENCOUNTER — Inpatient Hospital Stay (HOSPITAL_BASED_OUTPATIENT_CLINIC_OR_DEPARTMENT_OTHER): Payer: Medicare Other | Admitting: Internal Medicine

## 2018-05-24 ENCOUNTER — Other Ambulatory Visit: Payer: Self-pay

## 2018-05-24 VITALS — BP 110/68 | HR 78 | Temp 97.1°F | Resp 16 | Wt 220.0 lb

## 2018-05-24 DIAGNOSIS — I4891 Unspecified atrial fibrillation: Secondary | ICD-10-CM

## 2018-05-24 DIAGNOSIS — M545 Low back pain: Secondary | ICD-10-CM

## 2018-05-24 DIAGNOSIS — Z7901 Long term (current) use of anticoagulants: Secondary | ICD-10-CM

## 2018-05-24 DIAGNOSIS — R233 Spontaneous ecchymoses: Secondary | ICD-10-CM | POA: Diagnosis not present

## 2018-05-24 DIAGNOSIS — C9110 Chronic lymphocytic leukemia of B-cell type not having achieved remission: Secondary | ICD-10-CM

## 2018-05-24 DIAGNOSIS — Z87891 Personal history of nicotine dependence: Secondary | ICD-10-CM

## 2018-05-24 LAB — CBC WITH DIFFERENTIAL/PLATELET
Basophils Absolute: 0 10*3/uL (ref 0–0.1)
Basophils Relative: 0 %
Eosinophils Absolute: 0 10*3/uL (ref 0–0.7)
Eosinophils Relative: 0 %
HEMATOCRIT: 41 % (ref 40.0–52.0)
HEMOGLOBIN: 13.7 g/dL (ref 13.0–18.0)
LYMPHS ABS: 2.9 10*3/uL (ref 1.0–3.6)
LYMPHS PCT: 43 %
MCH: 28.7 pg (ref 26.0–34.0)
MCHC: 33.6 g/dL (ref 32.0–36.0)
MCV: 85.5 fL (ref 80.0–100.0)
MONOS PCT: 3 %
Monocytes Absolute: 0.2 10*3/uL (ref 0.2–1.0)
NEUTROS ABS: 3.7 10*3/uL (ref 1.4–6.5)
NEUTROS PCT: 54 %
Platelets: 42 10*3/uL — ABNORMAL LOW (ref 150–440)
RBC: 4.79 MIL/uL (ref 4.40–5.90)
RDW: 15.9 % — ABNORMAL HIGH (ref 11.5–14.5)
WBC: 6.8 10*3/uL (ref 3.8–10.6)

## 2018-05-24 LAB — COMPREHENSIVE METABOLIC PANEL
ALT: 16 U/L (ref 0–44)
ANION GAP: 10 (ref 5–15)
AST: 20 U/L (ref 15–41)
Albumin: 4.2 g/dL (ref 3.5–5.0)
Alkaline Phosphatase: 42 U/L (ref 38–126)
BUN: 17 mg/dL (ref 8–23)
CHLORIDE: 104 mmol/L (ref 98–111)
CO2: 21 mmol/L — AB (ref 22–32)
CREATININE: 1.05 mg/dL (ref 0.61–1.24)
Calcium: 8.6 mg/dL — ABNORMAL LOW (ref 8.9–10.3)
GFR calc non Af Amer: >60 mL/min (ref >60)
Glucose, Bld: 134 mg/dL — ABNORMAL HIGH (ref 70–99)
Potassium: 4.9 mmol/L (ref 3.5–5.1)
SODIUM: 135 mmol/L (ref 135–145)
Total Bilirubin: 0.8 mg/dL (ref 0.3–1.2)
Total Protein: 6.4 g/dL — ABNORMAL LOW (ref 6.5–8.1)

## 2018-05-24 NOTE — Patient Instructions (Signed)
HOLD eliquis until further instructions.

## 2018-05-24 NOTE — Assessment & Plan Note (Addendum)
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 STABLE.  # s/p Gazyva cycle #1 day2; Today platlets- 74; HOLD again today.  See discussion below regarding Eliquis.  # Right posterior flank pain-new? MSK vs other.  Recommend Tylenol.  # History of A. fib on Eliquis [platelets 30-40s]; easy bruising/bleeding;  discussed Dr. Osvaldo Angst currently not in A. Fib; discussed the risk benefits-agrees with holding Eliquis temporarily/hopefully platelet count improved by the next 1 month.  # Recommend Infectious prophylaxis: Acyclovir ; continue allopurinol.   #  On 9/3- Gazyva/cbc.; 9/17-cbc/bmp/MD-Gazyva.

## 2018-05-24 NOTE — Progress Notes (Signed)
[facility] OFF[facility] PROGRESS NOTE  Patient Care Team: [name], MD as PCP - General (Internal Medicine) Corey Skains, MD as Consulting Physician (Cardiology) Cammie Sickle, MD as Medical Oncologist (Hematology and Oncology)  Cancer Staging No matching staging information was found for the patient.   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 Surveillance; [facility]- UNMUTATED [dec 2017]  # AUG 2018- FISH panel analysis was positive for loss of one 13q14 signal. NEGATIVE for CCND1/IGH, ATM, chromosome 12, and TP53 were normal. MAY 2019- : 85% OF NUCLEI POSITIVE FOR A 13Q DELETION  #CHF/CAD s/p Defib- on Eliquis  [Dr.Kowalski]; Orthostatic hypotension   -------------------------------------------------------------   DIAGNOSIS: CLL  STAGE:  IV ;GOALS: CONTROL/palliative  CURRENT/MOST RECENT THERAPY;: July 29th, 2019 GAZYVA      CLL (chronic lymphocytic leukemia) ([place])   04/25/2018 -  Chemotherapy    The patient had obinutuzumab (GAZYVA) 100 mg in sodium chloride 0.9 % 100 mL (0.9615 mg/mL) chemo infusion, 100 mg, Intravenous, Once, 1 of 6 cycles Administration: 100 mg (04/26/2018), 900 mg (04/27/2018)  for chemotherapy treatment.        INTERVAL HISTORY:  STEVIE CHARTER 77 y.o.  male pleasant patient [name] currently on [name] is here for follow-up.  Patient complains of easy bruising; with minor trauma.  He has multiple bruises all of his upper lower extremities.  No gum bleeding.  Patient is currently on Eliquis for A. Fib.  Denies any palpitations.  Mild to moderate fatigue.  Review of Systems  Constitutional: Positive for malaise/fatigue. Negative for chills, diaphoresis, fever and weight loss.  HENT: Negative for nosebleeds and sore throat.   Eyes: Negative for double vision.   Respiratory: Negative for cough, hemoptysis, sputum production, shortness of breath and wheezing.   Cardiovascular: Negative for chest pain, palpitations, orthopnea and leg swelling.  Gastrointestinal: Negative for abdominal pain, blood in stool, constipation, diarrhea, heartburn, melena, nausea and vomiting.  Genitourinary: Negative for dysuria, frequency and urgency.  Musculoskeletal: Positive for back pain. Negative for joint pain.  Skin: Negative.  Negative for itching and rash.  Neurological: Negative for dizziness, tingling, focal weakness, weakness and headaches.  Endo/Heme/Allergies: Bruises/bleeds easily.  Psychiatric/Behavioral: Negative for depression. The patient is not nervous/anxious and does not have insomnia.       PAST MEDICAL HISTORY :  Past Medical History:  Diagnosis Date  . CLL (chronic lymphocytic leukemia) ([place])   . CPAP (continuous positive airway pressure) dependence   . Heart failure ([place])   . HOH (hard of hearing)   . Presence of automatic (implantable) cardiac defibrillator   . Sleep apnea   . Upper respiratory infection    chronic    PAST SURGICAL HISTORY :   Past Surgical History:  Procedure Laterality Date  . CARDIAC DEFIBRILLATOR PLACEMENT  2015    FAMILY HISTORY :   Family History  Problem Relation Age of Onset  . Diabetes Mother   . CAD Mother   . Diabetes Father   . CAD Father     SOCIAL HISTORY:   Social History   Tobacco Use  . Smoking status: Former Smoker    Packs/day: 2.00    Years: 42.00    Pack years: 84.00    Types: Cigarettes    Last attempt to quit: 1991    Years since quitting: 28.6  . Smokeless tobacco:  Current User    Types: Chew  . Tobacco comment: smoking cessation materials not required  Substance Use Topics  . Alcohol use: Yes    Comment: 6 beers per month  . Drug use: No    ALLERGIES:  has No Known Allergies.  MEDICATIONS:  Current Outpatient Medications  Medication Sig Dispense Refill  . acyclovir  (ZOVIRAX) 400 MG tablet One pill a day [to prevent shingles] 30 tablet 3  . allopurinol (ZYLOPRIM) 100 MG tablet TAKE 1 TABLET BY MOUTH ONCE EVERY EVENING    . apixaban (ELIQUIS) 5 MG TABS tablet TAKE 1 TABLET EVERY 12 HOURS    . dexamethasone (DECADRON) 4 MG tablet Take 1 tablet (4 mg total) by mouth daily. Take 1 tablet starting 2 days prior to infusion. Do not take on day of infusion 30 tablet 1  . levothyroxine (SYNTHROID, LEVOTHROID) 75 MCG tablet TAKE 1 TABLET BY MOUTH ONCE DAILY BEFOREBREAKFAST. 90 tablet 3  . metoprolol tartrate (LOPRESSOR) 25 MG tablet Take 25 mg 2 (two) times daily by mouth.    . montelukast (SINGULAIR) 10 MG tablet Take 1 tablet (10 mg total) by mouth at bedtime. Take 1 tablet starting 2 days prior to infusion x 4 days. 30 tablet 2  . rOPINIRole (REQUIP) 0.25 MG tablet Take 2 tablets (0.5 mg total) by mouth at bedtime. 60 tablet 0  . spironolactone (ALDACTONE) 25 MG tablet TAKE 1/2 TABLET BY MOUTH ONCE DAILY 45 tablet 3  . tamsulosin (FLOMAX) 0.4 MG CAPS capsule Take 1 capsule (0.4 mg total) by mouth daily after supper. 90 capsule 3  . torsemide (DEMADEX) 20 MG tablet Take 10 mg by mouth daily.     . Vitamin D, Cholecalciferol, 1000 units CAPS Take 2 capsules by mouth daily. 60 capsule    No current facility-administered medications for this visit.     PHYSICAL EXAMINATION: ECOG PERFORMANCE STATUS: 1 - Symptomatic but completely ambulatory  BP 110/68 (BP Location: Left Arm, Patient Position: Sitting)   Pulse 78   Temp (!) 97.1 F (36.2 C) (Tympanic)   Resp 16   Wt 220 lb (99.8 kg)   BMI 29.03 kg/m   Filed Weights   05/24/18 0837  Weight: 220 lb (99.8 kg)    Physical Exam  Constitutional: He is oriented to person, place, and time and well-developed, well-nourished, and in no distress.  HENT:  Head: Normocephalic and atraumatic.  Mouth/Throat: Oropharynx is clear and moist. No oropharyngeal exudate.  Eyes: Pupils are equal, round, and reactive to light.   Neck: Normal range of motion. Neck supple.  Cardiovascular: Normal rate and regular rhythm.  Pulmonary/Chest: No respiratory distress. He has no wheezes.  Abdominal: Soft. Bowel sounds are normal. He exhibits no distension and no mass. There is no tenderness. There is no rebound and no guarding.  Positive for splenomegaly.  Musculoskeletal: Normal range of motion. He exhibits no edema or tenderness.  Neurological: He is alert and oriented to person, place, and time.  Skin: Skin is warm.  Multiple bruises chronic-upper lower extremities.  Psychiatric: Affect normal.       LABORATORY DATA:  I have reviewed the data as listed    Component Value Date/Time   NA 135 05/24/2018 0809   NA 137 12/04/2017 1050   NA 136 07/25/2014 0414   K 4.9 05/24/2018 0809   K 5.6 (H) 07/25/2014 0414   CL 104 05/24/2018 0809   CL 101 07/25/2014 0414   CO2 21 (L) 05/24/2018 0809   CO2  24 07/25/2014 0414   GLUCOSE 134 (H) 05/24/2018 0809   GLUCOSE 143 (H) 07/25/2014 0414   BUN 17 05/24/2018 0809   BUN 15 12/04/2017 1050   BUN 33 (H) 07/25/2014 0414   CREATININE 1.05 05/24/2018 0809   CREATININE 1.34 (H) 01/15/2015 0956   CALCIUM 8.6 (L) 05/24/2018 0809   CALCIUM 8.3 (L) 07/25/2014 0414   PROT 6.4 (L) 05/24/2018 0809   PROT 5.8 (L) 08/17/2017 1551   PROT 6.4 07/24/2014 0937   ALBUMIN 4.2 05/24/2018 0809   ALBUMIN 4.3 08/17/2017 1551   ALBUMIN 4.0 07/24/2014 0937   AST 20 05/24/2018 0809   AST 31 07/24/2014 0937   ALT 16 05/24/2018 0809   ALT 23 07/24/2014 0937   ALKPHOS 42 05/24/2018 0809   ALKPHOS 53 07/24/2014 0937   BILITOT 0.8 05/24/2018 0809   BILITOT 0.4 08/17/2017 1551   BILITOT 1.1 (H) 07/24/2014 0937   GFRNONAA >60 05/24/2018 0809   GFRNONAA 52 (L) 01/15/2015 0956   GFRAA >60 05/24/2018 0809   GFRAA >60 01/15/2015 0956    No results found for: SPEP, UPEP  Lab Results  Component Value Date   WBC 6.8 05/24/2018   NEUTROABS 3.7 05/24/2018   HGB 13.7 05/24/2018   HCT 41.0  05/24/2018   MCV 85.5 05/24/2018   PLT 42 (L) 05/24/2018      Chemistry      Component Value Date/Time   NA 135 05/24/2018 0809   NA 137 12/04/2017 1050   NA 136 07/25/2014 0414   K 4.9 05/24/2018 0809   K 5.6 (H) 07/25/2014 0414   CL 104 05/24/2018 0809   CL 101 07/25/2014 0414   CO2 21 (L) 05/24/2018 0809   CO2 24 07/25/2014 0414   BUN 17 05/24/2018 0809   BUN 15 12/04/2017 1050   BUN 33 (H) 07/25/2014 0414   CREATININE 1.05 05/24/2018 0809   CREATININE 1.34 (H) 01/15/2015 0956      Component Value Date/Time   CALCIUM 8.6 (L) 05/24/2018 0809   CALCIUM 8.3 (L) 07/25/2014 0414   ALKPHOS 42 05/24/2018 0809   ALKPHOS 53 07/24/2014 0937   AST 20 05/24/2018 0809   AST 31 07/24/2014 0937   ALT 16 05/24/2018 0809   ALT 23 07/24/2014 0937   BILITOT 0.8 05/24/2018 0809   BILITOT 0.4 08/17/2017 1551   BILITOT 1.1 (H) 07/24/2014 3329       RADIOGRAPHIC STUDIES: I have personally reviewed the radiological images as listed and agreed with the findings in the report. No results found.   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 STABLE.  # s/p Gazyva cycle #1 day2; Today platlets- 36; HOLD again today.  See discussion below regarding Eliquis.  # Right posterior flank pain-new? MSK vs other.  Recommend Tylenol.  # History of A. fib on Eliquis [platelets 30-40s]; easy bruising/bleeding;  discussed Dr. Osvaldo Angst currently not in A. Fib; discussed the risk benefits-agrees with holding Eliquis temporarily/hopefully platelet count improved by the next 1 month.  # Recommend Infectious prophylaxis: Acyclovir ; continue allopurinol.   #  On 9/3- Gazyva/cbc.; 9/17-cbc/bmp/MD-Gazyva.    No orders of the defined types were placed in  this encounter.  All questions were answered. The patient knows to call the clinic with any problems, questions or concerns.      Cammie Sickle, MD 05/24/2018  10:13 AM

## 2018-05-29 ENCOUNTER — Other Ambulatory Visit: Payer: Self-pay | Admitting: Internal Medicine

## 2018-06-02 ENCOUNTER — Other Ambulatory Visit: Payer: Self-pay

## 2018-06-02 ENCOUNTER — Other Ambulatory Visit: Payer: Self-pay | Admitting: Internal Medicine

## 2018-06-02 ENCOUNTER — Inpatient Hospital Stay: Payer: Medicare Other | Attending: Internal Medicine

## 2018-06-02 ENCOUNTER — Inpatient Hospital Stay: Payer: Medicare Other

## 2018-06-02 VITALS — BP 101/68 | HR 77 | Resp 18

## 2018-06-02 DIAGNOSIS — E871 Hypo-osmolality and hyponatremia: Secondary | ICD-10-CM | POA: Diagnosis not present

## 2018-06-02 DIAGNOSIS — Z5112 Encounter for antineoplastic immunotherapy: Secondary | ICD-10-CM | POA: Insufficient documentation

## 2018-06-02 DIAGNOSIS — Z87891 Personal history of nicotine dependence: Secondary | ICD-10-CM | POA: Diagnosis not present

## 2018-06-02 DIAGNOSIS — Z79899 Other long term (current) drug therapy: Secondary | ICD-10-CM | POA: Diagnosis not present

## 2018-06-02 DIAGNOSIS — I480 Paroxysmal atrial fibrillation: Secondary | ICD-10-CM | POA: Diagnosis not present

## 2018-06-02 DIAGNOSIS — C9110 Chronic lymphocytic leukemia of B-cell type not having achieved remission: Secondary | ICD-10-CM | POA: Insufficient documentation

## 2018-06-02 DIAGNOSIS — J069 Acute upper respiratory infection, unspecified: Secondary | ICD-10-CM | POA: Insufficient documentation

## 2018-06-02 DIAGNOSIS — R5383 Other fatigue: Secondary | ICD-10-CM | POA: Diagnosis not present

## 2018-06-02 DIAGNOSIS — I509 Heart failure, unspecified: Secondary | ICD-10-CM | POA: Insufficient documentation

## 2018-06-02 LAB — CBC WITH DIFFERENTIAL/PLATELET
BASOS PCT: 0 %
Basophils Absolute: 0 10*3/uL (ref 0–0.1)
EOS ABS: 0 10*3/uL (ref 0–0.7)
EOS PCT: 1 %
HCT: 40.2 % (ref 40.0–52.0)
HEMOGLOBIN: 13.5 g/dL (ref 13.0–18.0)
LYMPHS ABS: 5.3 10*3/uL — AB (ref 1.0–3.6)
Lymphocytes Relative: 65 %
MCH: 28.8 pg (ref 26.0–34.0)
MCHC: 33.5 g/dL (ref 32.0–36.0)
MCV: 86 fL (ref 80.0–100.0)
Monocytes Absolute: 0.3 10*3/uL (ref 0.2–1.0)
Monocytes Relative: 3 %
NEUTROS PCT: 31 %
Neutro Abs: 2.6 10*3/uL (ref 1.4–6.5)
PLATELETS: 52 10*3/uL — AB (ref 150–440)
RBC: 4.68 MIL/uL (ref 4.40–5.90)
RDW: 16.1 % — ABNORMAL HIGH (ref 11.5–14.5)
WBC: 8.2 10*3/uL (ref 3.8–10.6)

## 2018-06-02 LAB — COMPREHENSIVE METABOLIC PANEL
ALT: 16 U/L (ref 0–44)
AST: 21 U/L (ref 15–41)
Albumin: 4.1 g/dL (ref 3.5–5.0)
Alkaline Phosphatase: 41 U/L (ref 38–126)
Anion gap: 9 (ref 5–15)
BILIRUBIN TOTAL: 0.9 mg/dL (ref 0.3–1.2)
BUN: 18 mg/dL (ref 8–23)
CHLORIDE: 105 mmol/L (ref 98–111)
CO2: 21 mmol/L — ABNORMAL LOW (ref 22–32)
CREATININE: 1.03 mg/dL (ref 0.61–1.24)
Calcium: 8.6 mg/dL — ABNORMAL LOW (ref 8.9–10.3)
Glucose, Bld: 110 mg/dL — ABNORMAL HIGH (ref 70–99)
Potassium: 4.4 mmol/L (ref 3.5–5.1)
Sodium: 135 mmol/L (ref 135–145)
Total Protein: 6 g/dL — ABNORMAL LOW (ref 6.5–8.1)

## 2018-06-02 MED ORDER — SODIUM CHLORIDE 0.9 % IV SOLN
1000.0000 mg | Freq: Once | INTRAVENOUS | Status: AC
  Administered 2018-06-02: 1000 mg via INTRAVENOUS
  Filled 2018-06-02: qty 40

## 2018-06-02 MED ORDER — DIPHENHYDRAMINE HCL 50 MG/ML IJ SOLN
50.0000 mg | Freq: Once | INTRAMUSCULAR | Status: AC
  Administered 2018-06-02: 50 mg via INTRAVENOUS
  Filled 2018-06-02: qty 1

## 2018-06-02 MED ORDER — SODIUM CHLORIDE 0.9 % IV SOLN
20.0000 mg | Freq: Once | INTRAVENOUS | Status: AC
  Administered 2018-06-02: 20 mg via INTRAVENOUS
  Filled 2018-06-02: qty 2

## 2018-06-02 MED ORDER — SODIUM CHLORIDE 0.9 % IV SOLN
Freq: Once | INTRAVENOUS | Status: AC
  Administered 2018-06-02: 10:00:00 via INTRAVENOUS
  Filled 2018-06-02: qty 250

## 2018-06-02 MED ORDER — ACETAMINOPHEN 325 MG PO TABS
650.0000 mg | ORAL_TABLET | Freq: Once | ORAL | Status: AC
  Administered 2018-06-02: 650 mg via ORAL
  Filled 2018-06-02: qty 2

## 2018-06-02 NOTE — Progress Notes (Signed)
Plt 52.  MD ok to proceed with treatment

## 2018-06-14 ENCOUNTER — Inpatient Hospital Stay: Payer: Medicare Other

## 2018-06-14 ENCOUNTER — Inpatient Hospital Stay (HOSPITAL_BASED_OUTPATIENT_CLINIC_OR_DEPARTMENT_OTHER): Payer: Medicare Other | Admitting: Internal Medicine

## 2018-06-14 ENCOUNTER — Encounter: Payer: Self-pay | Admitting: Internal Medicine

## 2018-06-14 VITALS — BP 102/64 | HR 76 | Temp 97.6°F | Resp 16

## 2018-06-14 DIAGNOSIS — C9110 Chronic lymphocytic leukemia of B-cell type not having achieved remission: Secondary | ICD-10-CM

## 2018-06-14 DIAGNOSIS — Z87891 Personal history of nicotine dependence: Secondary | ICD-10-CM

## 2018-06-14 DIAGNOSIS — I4891 Unspecified atrial fibrillation: Secondary | ICD-10-CM

## 2018-06-14 DIAGNOSIS — R5383 Other fatigue: Secondary | ICD-10-CM | POA: Diagnosis not present

## 2018-06-14 DIAGNOSIS — Z5112 Encounter for antineoplastic immunotherapy: Secondary | ICD-10-CM | POA: Diagnosis not present

## 2018-06-14 LAB — COMPREHENSIVE METABOLIC PANEL
ALT: 17 U/L (ref 0–44)
AST: 18 U/L (ref 15–41)
Albumin: 4 g/dL (ref 3.5–5.0)
Alkaline Phosphatase: 39 U/L (ref 38–126)
Anion gap: 9 (ref 5–15)
BUN: 19 mg/dL (ref 8–23)
CO2: 24 mmol/L (ref 22–32)
CREATININE: 1.03 mg/dL (ref 0.61–1.24)
Calcium: 8.4 mg/dL — ABNORMAL LOW (ref 8.9–10.3)
Chloride: 104 mmol/L (ref 98–111)
GFR calc Af Amer: >60 mL/min (ref >60)
Glucose, Bld: 94 mg/dL (ref 70–99)
Potassium: 4.5 mmol/L (ref 3.5–5.1)
Sodium: 137 mmol/L (ref 135–145)
Total Bilirubin: 0.8 mg/dL (ref 0.3–1.2)
Total Protein: 5.6 g/dL — ABNORMAL LOW (ref 6.5–8.1)

## 2018-06-14 LAB — CBC WITH DIFFERENTIAL/PLATELET
BASOS PCT: 1 %
Basophils Absolute: 0 10*3/uL (ref 0–0.1)
EOS ABS: 0 10*3/uL (ref 0–0.7)
EOS PCT: 1 %
HCT: 40.5 % (ref 40.0–52.0)
Hemoglobin: 13.5 g/dL (ref 13.0–18.0)
Lymphocytes Relative: 18 %
Lymphs Abs: 0.7 10*3/uL — ABNORMAL LOW (ref 1.0–3.6)
MCH: 28.7 pg (ref 26.0–34.0)
MCHC: 33.3 g/dL (ref 32.0–36.0)
MCV: 86.1 fL (ref 80.0–100.0)
Monocytes Absolute: 0.2 10*3/uL (ref 0.2–1.0)
Monocytes Relative: 4 %
Neutro Abs: 3.2 10*3/uL (ref 1.4–6.5)
Neutrophils Relative %: 76 %
PLATELETS: 59 10*3/uL — AB (ref 150–440)
RBC: 4.7 MIL/uL (ref 4.40–5.90)
RDW: 15.7 % — ABNORMAL HIGH (ref 11.5–14.5)
WBC: 4.2 10*3/uL (ref 3.8–10.6)

## 2018-06-14 MED ORDER — SODIUM CHLORIDE 0.9 % IV SOLN
1000.0000 mg | Freq: Once | INTRAVENOUS | Status: AC
  Administered 2018-06-14: 1000 mg via INTRAVENOUS
  Filled 2018-06-14: qty 40

## 2018-06-14 MED ORDER — DIPHENHYDRAMINE HCL 50 MG/ML IJ SOLN
50.0000 mg | Freq: Once | INTRAMUSCULAR | Status: AC
  Administered 2018-06-14: 50 mg via INTRAVENOUS
  Filled 2018-06-14: qty 1

## 2018-06-14 MED ORDER — SODIUM CHLORIDE 0.9 % IV SOLN
Freq: Once | INTRAVENOUS | Status: AC
  Administered 2018-06-14: 10:00:00 via INTRAVENOUS
  Filled 2018-06-14: qty 250

## 2018-06-14 MED ORDER — ACETAMINOPHEN 325 MG PO TABS
650.0000 mg | ORAL_TABLET | Freq: Once | ORAL | Status: AC
  Administered 2018-06-14: 650 mg via ORAL
  Filled 2018-06-14: qty 2

## 2018-06-14 MED ORDER — SODIUM CHLORIDE 0.9 % IV SOLN
20.0000 mg | Freq: Once | INTRAVENOUS | Status: AC
  Administered 2018-06-14: 20 mg via INTRAVENOUS
  Filled 2018-06-14: qty 2

## 2018-06-14 NOTE — Assessment & Plan Note (Addendum)
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 STABLE.   # s/p Gazyva cycle #1 day 2; proceed with cycle #2 today.  Today platlets- 59; proceed with treatment today.   # Right posterior flank pain-new? MSK vs other. Improved.   # History of A. fib on Eliquis [platelets 30-40s]; easy bruising/bleeding; off eliquis   # continue Infectious prophylaxis: Acyclovir ; continue allopurinol.   #  In 2 weeks-cbc; follow up in 4 weeks-cbc/cmp/MD-Gazyva.

## 2018-06-14 NOTE — Progress Notes (Signed)
[place] OFFICE PROGRESS NOTE  Patient Care Team: [name], MD as PCP - General (Internal Medicine) Corey Skains, MD as Consulting Physician (Cardiology) Cammie Sickle, MD as Medical Oncologist (Hematology and Oncology)  Cancer Staging No matching staging information was found for the patient.   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 Surveillance; IGVH- UNMUTATED [dec 2017]  # AUG 2018- FISH panel analysis was positive for loss of one 13q14 signal. NEGATIVE for CCND1/IGH, ATM, chromosome 12, and TP53 were normal. MAY 2019- : 85% OF NUCLEI POSITIVE FOR A 13Q DELETION  #CHF/CAD s/p Defib- on Eliquis  [Dr.Kowalski]; Orthostatic hypotension   -------------------------------------------------------------   DIAGNOSIS: CLL  STAGE:  IV ;GOALS: CONTROL/palliative  CURRENT/MOST RECENT THERAPY;: July 29th, 2019 GAZYVA      CLL (chronic lymphocytic leukemia) ([place])   04/25/2018 -  Chemotherapy    The patient had obinutuzumab (GAZYVA) 100 mg in sodium chloride 0.9 % 100 mL (0.9615 mg/mL) chemo infusion, 100 mg, Intravenous, Once, 1 of 6 cycles Administration: 100 mg (04/26/2018), 900 mg (04/27/2018), 1,000 mg (06/02/2018)  for chemotherapy treatment.        INTERVAL HISTORY:  Clifford Herrera 77 y.o.  male pleasant patient [name] currently on [name] is here for follow-up.  Patient currently status post cycle #1.  Since coming of Eliquis patient's bruising is improved.  He was recently evaluated by cardiology.  As per his wife fatigue is slightly improved.  No falls.  No nausea no vomiting.  Review of Systems  Constitutional: Positive for malaise/fatigue. Negative for chills, diaphoresis, fever and weight loss.  HENT: Negative for nosebleeds and sore throat.   Eyes: Negative for double  vision.  Respiratory: Negative for cough, hemoptysis, sputum production, shortness of breath and wheezing.   Cardiovascular: Negative for chest pain, palpitations, orthopnea and leg swelling.  Gastrointestinal: Negative for abdominal pain, blood in stool, constipation, diarrhea, heartburn, melena, nausea and vomiting.  Genitourinary: Negative for dysuria, frequency and urgency.  Musculoskeletal: Negative for joint pain.  Skin: Negative.  Negative for itching and rash.  Neurological: Negative for dizziness, tingling, focal weakness, weakness and headaches.  Endo/Heme/Allergies: Bruises/bleeds easily (improving).  Psychiatric/Behavioral: Negative for depression. The patient is not nervous/anxious and does not have insomnia.       PAST MEDICAL HISTORY :  Past Medical History:  Diagnosis Date  . CLL (chronic lymphocytic leukemia) ([place])   . CPAP (continuous positive airway pressure) dependence   . Heart failure ([place])   . HOH (hard of hearing)   . Presence of automatic (implantable) cardiac defibrillator   . Sleep apnea   . Upper respiratory infection    chronic    PAST SURGICAL HISTORY :   Past Surgical History:  Procedure Laterality Date  . CARDIAC DEFIBRILLATOR PLACEMENT  2015    FAMILY HISTORY :   Family History  Problem Relation Age of Onset  . Diabetes Mother   . CAD Mother   . Diabetes Father   . CAD Father     SOCIAL HISTORY:   Social History   Tobacco Use  . Smoking status: Former Smoker    Packs/day: 2.00    Years: 42.00    Pack years: 84.00    Types: Cigarettes    Last attempt to quit: 1991    Years since quitting: 28.7  . Smokeless tobacco: Current User  Types: Chew  . Tobacco comment: smoking cessation materials not required  Substance Use Topics  . Alcohol use: Yes    Comment: 6 beers per month  . Drug use: No    ALLERGIES:  has No Known Allergies.  MEDICATIONS:  Current Outpatient Medications  Medication Sig Dispense Refill  . acyclovir  (ZOVIRAX) 400 MG tablet One pill a day [to prevent shingles] 30 tablet 3  . allopurinol (ZYLOPRIM) 100 MG tablet TAKE 1 TABLET BY MOUTH ONCE EVERY EVENING    . apixaban (ELIQUIS) 5 MG TABS tablet TAKE 1 TABLET EVERY 12 HOURS    . dexamethasone (DECADRON) 4 MG tablet Take 1 tablet (4 mg total) by mouth daily. Take 1 tablet starting 2 days prior to infusion. Do not take on day of infusion 30 tablet 1  . levothyroxine (SYNTHROID, LEVOTHROID) 75 MCG tablet TAKE 1 TABLET BY MOUTH ONCE DAILY BEFOREBREAKFAST. 90 tablet 3  . metoprolol tartrate (LOPRESSOR) 25 MG tablet Take 25 mg 2 (two) times daily by mouth.    . montelukast (SINGULAIR) 10 MG tablet Take 1 tablet (10 mg total) by mouth at bedtime. Take 1 tablet starting 2 days prior to infusion x 4 days. 30 tablet 2  . rOPINIRole (REQUIP) 0.25 MG tablet TAKE 2 TABLETS BY MOUTH AT BEDTIME 60 tablet 0  . spironolactone (ALDACTONE) 25 MG tablet TAKE 1/2 TABLET BY MOUTH ONCE DAILY 45 tablet 3  . tamsulosin (FLOMAX) 0.4 MG CAPS capsule Take 1 capsule (0.4 mg total) by mouth daily after supper. 90 capsule 3  . torsemide (DEMADEX) 20 MG tablet Take 10 mg by mouth daily.     . Vitamin D, Cholecalciferol, 1000 units CAPS Take 2 capsules by mouth daily. 60 capsule    No current facility-administered medications for this visit.     PHYSICAL EXAMINATION: ECOG PERFORMANCE STATUS: 1 - Symptomatic but completely ambulatory  BP 102/64 (BP Location: Left Arm, Patient Position: Sitting)   Pulse 76   Temp 97.6 F (36.4 C) (Tympanic)   Resp 16   There were no vitals filed for this visit.  Physical Exam  Constitutional: He is oriented to person, place, and time and well-developed, well-nourished, and in no distress.  Accompanied by his wife.  Is walking himself.  HENT:  Head: Normocephalic and atraumatic.  Mouth/Throat: Oropharynx is clear and moist. No oropharyngeal exudate.  Eyes: Pupils are equal, round, and reactive to light.  Neck: Normal range of motion.  Neck supple.  Cardiovascular: Normal rate and regular rhythm.  Pulmonary/Chest: No respiratory distress. He has no wheezes.  Abdominal: Soft. Bowel sounds are normal. He exhibits no distension and no mass. There is no tenderness. There is no rebound and no guarding.  Positive for splenomegaly.  Musculoskeletal: Normal range of motion. He exhibits no edema or tenderness.  Neurological: He is alert and oriented to person, place, and time.  Skin: Skin is warm.  Multiple bruises chronic-upper lower extremities.  Psychiatric: Affect normal.       LABORATORY DATA:  I have reviewed the data as listed    Component Value Date/Time   NA 137 06/14/2018 0841   NA 137 12/04/2017 1050   NA 136 07/25/2014 0414   K 4.5 06/14/2018 0841   K 5.6 (H) 07/25/2014 0414   CL 104 06/14/2018 0841   CL 101 07/25/2014 0414   CO2 24 06/14/2018 0841   CO2 24 07/25/2014 0414   GLUCOSE 94 06/14/2018 0841   GLUCOSE 143 (H) 07/25/2014 0414   BUN  19 06/14/2018 0841   BUN 15 12/04/2017 1050   BUN 33 (H) 07/25/2014 0414   CREATININE 1.03 06/14/2018 0841   CREATININE 1.34 (H) 01/15/2015 0956   CALCIUM 8.4 (L) 06/14/2018 0841   CALCIUM 8.3 (L) 07/25/2014 0414   PROT 5.6 (L) 06/14/2018 0841   PROT 5.8 (L) 08/17/2017 1551   PROT 6.4 07/24/2014 0937   ALBUMIN 4.0 06/14/2018 0841   ALBUMIN 4.3 08/17/2017 1551   ALBUMIN 4.0 07/24/2014 0937   AST 18 06/14/2018 0841   AST 31 07/24/2014 0937   ALT 17 06/14/2018 0841   ALT 23 07/24/2014 0937   ALKPHOS 39 06/14/2018 0841   ALKPHOS 53 07/24/2014 0937   BILITOT 0.8 06/14/2018 0841   BILITOT 0.4 08/17/2017 1551   BILITOT 1.1 (H) 07/24/2014 0937   GFRNONAA >60 06/14/2018 0841   GFRNONAA 52 (L) 01/15/2015 0956   GFRAA >60 06/14/2018 0841   GFRAA >60 01/15/2015 0956    No results found for: SPEP, UPEP  Lab Results  Component Value Date   WBC 4.2 06/14/2018   NEUTROABS 3.2 06/14/2018   HGB 13.5 06/14/2018   HCT 40.5 06/14/2018   MCV 86.1 06/14/2018   PLT  59 (L) 06/14/2018      Chemistry      Component Value Date/Time   NA 137 06/14/2018 0841   NA 137 12/04/2017 1050   NA 136 07/25/2014 0414   K 4.5 06/14/2018 0841   K 5.6 (H) 07/25/2014 0414   CL 104 06/14/2018 0841   CL 101 07/25/2014 0414   CO2 24 06/14/2018 0841   CO2 24 07/25/2014 0414   BUN 19 06/14/2018 0841   BUN 15 12/04/2017 1050   BUN 33 (H) 07/25/2014 0414   CREATININE 1.03 06/14/2018 0841   CREATININE 1.34 (H) 01/15/2015 0956      Component Value Date/Time   CALCIUM 8.4 (L) 06/14/2018 0841   CALCIUM 8.3 (L) 07/25/2014 0414   ALKPHOS 39 06/14/2018 0841   ALKPHOS 53 07/24/2014 0937   AST 18 06/14/2018 0841   AST 31 07/24/2014 0937   ALT 17 06/14/2018 0841   ALT 23 07/24/2014 0937   BILITOT 0.8 06/14/2018 0841   BILITOT 0.4 08/17/2017 1551   BILITOT 1.1 (H) 07/24/2014 1749       RADIOGRAPHIC STUDIES: I have personally reviewed the radiological images as listed and agreed with the findings in the report. No results found.   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 STABLE.   # s/p Gazyva cycle #1 day 2; proceed with cycle #2 today.  Today platlets- 59; proceed with treatment today.   # Right posterior flank pain-new? MSK vs other. Improved.   # History of A. fib on Eliquis [platelets 30-40s]; easy bruising/bleeding; off eliquis   # continue Infectious prophylaxis: Acyclovir ; continue allopurinol.   #  In 2 weeks-cbc; follow up in 4 weeks-cbc/cmp/MD-Gazyva.    Orders Placed This Encounter  Procedures  . CBC with Differential    Standing Status:   Future    Standing Expiration Date:   06/15/2019  . Comprehensive metabolic panel    Standing Status:   Future    Standing Expiration Date:   06/15/2019  . CBC  with Differential    Standing Status:   Future    Standing Expiration Date:   06/14/2019   All  questions were answered. The patient knows to call the clinic with any problems, questions or concerns.      Cammie Sickle, MD 06/14/2018 9:28 AM

## 2018-06-19 ENCOUNTER — Other Ambulatory Visit: Payer: Self-pay | Admitting: Internal Medicine

## 2018-06-21 ENCOUNTER — Telehealth: Payer: Self-pay | Admitting: *Deleted

## 2018-06-21 ENCOUNTER — Inpatient Hospital Stay: Payer: Medicare Other

## 2018-06-21 ENCOUNTER — Encounter: Payer: Self-pay | Admitting: Nurse Practitioner

## 2018-06-21 ENCOUNTER — Inpatient Hospital Stay (HOSPITAL_BASED_OUTPATIENT_CLINIC_OR_DEPARTMENT_OTHER): Payer: Medicare Other | Admitting: Nurse Practitioner

## 2018-06-21 VITALS — BP 102/67 | HR 82 | Temp 100.6°F | Resp 22 | Wt 226.0 lb

## 2018-06-21 DIAGNOSIS — I509 Heart failure, unspecified: Secondary | ICD-10-CM | POA: Diagnosis not present

## 2018-06-21 DIAGNOSIS — C9110 Chronic lymphocytic leukemia of B-cell type not having achieved remission: Secondary | ICD-10-CM | POA: Diagnosis not present

## 2018-06-21 DIAGNOSIS — E871 Hypo-osmolality and hyponatremia: Secondary | ICD-10-CM | POA: Diagnosis not present

## 2018-06-21 DIAGNOSIS — Z87891 Personal history of nicotine dependence: Secondary | ICD-10-CM

## 2018-06-21 DIAGNOSIS — J811 Chronic pulmonary edema: Secondary | ICD-10-CM

## 2018-06-21 DIAGNOSIS — I480 Paroxysmal atrial fibrillation: Secondary | ICD-10-CM

## 2018-06-21 DIAGNOSIS — J069 Acute upper respiratory infection, unspecified: Secondary | ICD-10-CM

## 2018-06-21 DIAGNOSIS — Z5112 Encounter for antineoplastic immunotherapy: Secondary | ICD-10-CM | POA: Diagnosis not present

## 2018-06-21 LAB — COMPREHENSIVE METABOLIC PANEL
ALK PHOS: 44 U/L (ref 38–126)
ALT: 15 U/L (ref 0–44)
ANION GAP: 9 (ref 5–15)
AST: 16 U/L (ref 15–41)
Albumin: 3.9 g/dL (ref 3.5–5.0)
BILIRUBIN TOTAL: 1.4 mg/dL — AB (ref 0.3–1.2)
BUN: 22 mg/dL (ref 8–23)
CALCIUM: 8.3 mg/dL — AB (ref 8.9–10.3)
CO2: 22 mmol/L (ref 22–32)
Chloride: 97 mmol/L — ABNORMAL LOW (ref 98–111)
Creatinine, Ser: 1.02 mg/dL (ref 0.61–1.24)
Glucose, Bld: 101 mg/dL — ABNORMAL HIGH (ref 70–99)
Potassium: 4.7 mmol/L (ref 3.5–5.1)
Sodium: 128 mmol/L — ABNORMAL LOW (ref 135–145)
TOTAL PROTEIN: 6.1 g/dL — AB (ref 6.5–8.1)

## 2018-06-21 LAB — CBC WITH DIFFERENTIAL/PLATELET
Basophils Absolute: 0 10*3/uL (ref 0–0.1)
Basophils Relative: 0 %
Eosinophils Absolute: 0 10*3/uL (ref 0–0.7)
Eosinophils Relative: 0 %
HEMATOCRIT: 39.9 % — AB (ref 40.0–52.0)
HEMOGLOBIN: 13.7 g/dL (ref 13.0–18.0)
LYMPHS ABS: 0.7 10*3/uL — AB (ref 1.0–3.6)
Lymphocytes Relative: 9 %
MCH: 28.7 pg (ref 26.0–34.0)
MCHC: 34.4 g/dL (ref 32.0–36.0)
MCV: 83.5 fL (ref 80.0–100.0)
Monocytes Absolute: 0.6 10*3/uL (ref 0.2–1.0)
Monocytes Relative: 8 %
NEUTROS ABS: 6.3 10*3/uL (ref 1.4–6.5)
NEUTROS PCT: 83 %
Platelets: 53 10*3/uL — ABNORMAL LOW (ref 150–440)
RBC: 4.78 MIL/uL (ref 4.40–5.90)
RDW: 16 % — ABNORMAL HIGH (ref 11.5–14.5)
WBC: 7.6 10*3/uL (ref 3.8–10.6)

## 2018-06-21 MED ORDER — AZITHROMYCIN 250 MG PO TABS: ORAL_TABLET | ORAL | 0 refills | Status: DC

## 2018-06-21 NOTE — Patient Instructions (Addendum)
Clifford Herrera,   I have sent a prescription for Azithromycin to your pharmacy. You can start this medication today.   Continue the mucinex and robitussin you have been using. Mucinex (guaifenesin) to help loosen up the mucus in your chest and sinuses Ensure you take this medication with a full glass of water as staying well hydrated will also help to thin the mucous. Robitussin helps to quiet/stop your cough.   Please start Afrin for 3 days and Flonase twice a day. You will continue to [facility] after completing the course of Afrin.  This will help to open up your sinuses and help resolve your infection.  Staying well hydrated will also help keep your fever down and thin your secretions making them easier to cough up. You may find comfort in sleeping on an incline for the next few days as your sinuses drain. If the cough wakes you up at night please let me know and I will send a prescription for cough medicine.   Please notify me if you've had no improvement in your symptoms, or symptoms become worse, in the next 3-4 days.  It was a pleasure meeting you today and thank you for allowing me to participate in your care.  -Beckey Rutter, NP

## 2018-06-21 NOTE — Progress Notes (Signed)
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 Surveillance; IGVH- UNMUTATED [dec 2017]  # AUG 2018- FISH panel analysis was positive for loss of one 13q14 signal. NEGATIVE for CCND1/IGH, ATM, chromosome 12, and TP53 were normal. MAY 2019- : 85% OF NUCLEI POSITIVE FOR A 13Q DELETION  #CHF/CAD s/p Defib- on Eliquis  [Dr.Kowalski]; Orthostatic hypotension   -------------------------------------------------------------   DIAGNOSIS: CLL  STAGE:  IV ;GOALS: CONTROL/palliative  CURRENT/MOST RECENT THERAPY;: July 29th, 2019 GAZYVA      CLL (chronic lymphocytic leukemia) ([place])   04/25/2018 -  Chemotherapy    The patient had obinutuzumab (GAZYVA) 100 mg in sodium chloride 0.9 % 100 mL (0.9615 mg/mL) chemo infusion, 100 mg, Intravenous, Once, 2 of 6 cycles Administration: 100 mg (04/26/2018), 900 mg (04/27/2018), 1,000 mg (06/14/2018), 1,000 mg (06/02/2018)  for chemotherapy treatment.      Interval history- Clifford Herrera, 77 year old male who presents to symptom management clinic with complaints of low-grade fever, cough and congestion which started last week and has gradually worsened since that time.  He states that he first noticed symptoms after his treatment on 06/14/2018 but developed fever, Tmax 101, over the weekend.  Thick congested cough  with white sputum.  He has been taking Mucinex and Robitussin at home for cough and congestion but is unsure if it has made a difference.  Complains of headache and facial pressure. Patient last received Gazyva on 06/14/18.  He continues allopurinol and acyclovir for prophylaxis.  Complains of mild dizziness.  Wife complains that he is persistently fatigued and sleeps off and on throughout the day.  She states this is chronic and not significantly worse.  ECOG FS:1 - Symptomatic but completely ambulatory  Review of systems- Review of Systems  Constitutional: Positive for fever and malaise/fatigue. Negative for chills, diaphoresis and weight loss.  HENT: Positive for congestion, hearing loss, sinus pain and sore throat. Negative for ear discharge, ear pain and tinnitus.   Eyes: Negative.   Respiratory: Positive for cough and sputum production. Negative for hemoptysis, shortness of breath and wheezing.   Cardiovascular: Negative for chest pain, palpitations, orthopnea, claudication and leg swelling.  Gastrointestinal: Negative for abdominal pain, blood in stool, constipation, diarrhea, heartburn, nausea and vomiting.  Genitourinary: Positive for hematuria (chronic).  Musculoskeletal: Negative.   Neurological: Positive for dizziness and weakness. Negative for tingling and headaches.  Endo/Heme/Allergies: Bruises/bleeds easily.  Psychiatric/Behavioral: Negative.     Current treatment- Gazyva (last 06/14/18)  No Known Allergies  Past Medical History:  Diagnosis Date  . CLL (chronic lymphocytic leukemia) ([place])   . CPAP (continuous positive airway pressure) dependence   . Heart failure ([place])   . HOH (hard of hearing)   . Presence of automatic (implantable) cardiac defibrillator   . Sleep apnea   . Upper respiratory infection    chronic    Past Surgical History:  Procedure Laterality Date  . CARDIAC DEFIBRILLATOR PLACEMENT  2015  Social History   Socioeconomic History  . Marital  status: Married    Spouse name: Not on file  . Number of children: 3  . Years of education: Not on file  . Highest education level: Bachelor's degree (e.g., BA, AB, BS)  Occupational History  . Occupation: Retired  Scientific laboratory technician  . Financial resource strain: Not hard at all  . Food insecurity:    Worry: Never true    Inability: Never true  . Transportation needs:    Medical: No    Non-medical: No  Tobacco Use  . Smoking status: Former Smoker    Packs/day: 2.00    Years: 42.00    Pack years: 84.00    Types: Cigarettes    Last attempt to quit: 1991    Years since quitting: 28.7  . Smokeless tobacco: Current User    Types: Chew  . Tobacco comment: smoking cessation materials not required  Substance and Sexual Activity  . Alcohol use: Yes    Comment: 6 beers per month  . Drug use: No  . Sexual activity: Not Currently    Comment: quit about 20 years ago  Lifestyle  . Physical activity:    Days per week: 0 days    Minutes per session: 0 min  . Stress: Not at all  Relationships  . Social connections:    Talks on phone: Patient refused    Gets together: Patient refused    Attends religious service: Patient refused    Active member of club or organization: Patient refused    Attends meetings of clubs or organizations: Patient refused    Relationship status: Patient refused  . Intimate partner violence:    Fear of current or ex partner: No    Emotionally abused: No    Physically abused: No    Forced sexual activity: No  Other Topics Concern  . Not on file  Social History Narrative  . Not on file    Family History  Problem Relation Age of Onset  . Diabetes Mother   . CAD Mother   . Diabetes Father   . CAD Father      Current Outpatient Medications:  .  acyclovir (ZOVIRAX) 400 MG tablet, One pill a day [to prevent shingles], Disp: 30 tablet, Rfl: 3 .  allopurinol (ZYLOPRIM) 100 MG tablet, TAKE 1 TABLET BY MOUTH ONCE EVERY EVENING, Disp: , Rfl:  .  dexamethasone  (DECADRON) 4 MG tablet, Take 1 tablet (4 mg total) by mouth daily. Take 1 tablet starting 2 days prior to infusion. Do not take on day of infusion, Disp: 30 tablet, Rfl: 1 .  guaiFENesin (MUCINEX) 600 MG 12 hr tablet, Take by mouth 2 (two) times daily., Disp: , Rfl:  .  guaifenesin (ROBITUSSIN) 100 MG/5ML syrup, Take 200 mg by mouth 2 (two) times daily., Disp: , Rfl:  .  levothyroxine (SYNTHROID, LEVOTHROID) 75 MCG tablet, TAKE 1 TABLET BY MOUTH ONCE DAILY BEFOREBREAKFAST., Disp: 90 tablet, Rfl: 3 .  metoprolol tartrate (LOPRESSOR) 25 MG tablet, Take 25 mg 2 (two) times daily by mouth., Disp: , Rfl:  .  montelukast (SINGULAIR) 10 MG tablet, Take 1 tablet (10 mg total) by mouth at bedtime. Take 1 tablet starting 2 days prior to infusion x 4 days., Disp: 30 tablet, Rfl: 2 .  rOPINIRole (REQUIP) 0.25 MG tablet, TAKE 2 TABLETS BY MOUTH AT BEDTIME, Disp: 60 tablet, Rfl: 0 .  spironolactone (ALDACTONE) 25 MG tablet, TAKE 1/2 TABLET BY MOUTH ONCE DAILY, Disp:  45 tablet, Rfl: 3 .  tamsulosin (FLOMAX) 0.4 MG CAPS capsule, Take 1 capsule (0.4 mg total) by mouth daily after supper., Disp: 90 capsule, Rfl: 3 .  torsemide (DEMADEX) 20 MG tablet, Take 10 mg by mouth daily. , Disp: , Rfl:  .  Vitamin D, Cholecalciferol, 1000 units CAPS, Take 2 capsules by mouth daily., Disp: 60 capsule, Rfl:  .  apixaban (ELIQUIS) 5 MG TABS tablet, TAKE 1 TABLET EVERY 12 HOURS, Disp: , Rfl:   Physical exam:  Vitals:   06/21/18 1406  BP: 102/67  Pulse: 82  Resp: (!) 22  Temp: (!) 100.6 F (38.1 C)  TempSrc: Tympanic  SpO2: 93%  Weight: 226 lb (102.5 kg)   Physical Exam  Constitutional: He is oriented to person, place, and time. He appears well-developed and well-nourished. No distress.  HENT:  Head: Normocephalic and atraumatic.  Right Ear: Decreased hearing is noted.  Left Ear: Decreased hearing is noted.  Nose: Mucosal edema and rhinorrhea present. Right sinus exhibits frontal sinus tenderness. Left sinus exhibits  frontal sinus tenderness.  Mouth/Throat: Oropharynx is clear and moist and mucous membranes are normal. No oropharyngeal exudate.  Eyes: Pupils are equal, round, and reactive to light. Conjunctivae and EOM are normal. No scleral icterus.  Neck: Normal range of motion. Neck supple.  Cardiovascular: Normal rate, regular rhythm and normal heart sounds.  Pulmonary/Chest: Effort normal and breath sounds normal. He has no wheezes. He has no rhonchi. He has no rales.  Abdominal: Soft. Bowel sounds are normal. He exhibits no distension. There is no tenderness.  Musculoskeletal: Normal range of motion.       Right lower leg: He exhibits edema.       Left lower leg: He exhibits edema.  Neurological: He is alert and oriented to person, place, and time.  Skin: Skin is warm and dry.  Psychiatric: He has a normal mood and affect. His behavior is normal.     CMP Latest Ref Rng & Units 06/21/2018  Glucose 70 - 99 mg/dL 101(H)  BUN 8 - 23 mg/dL 22  Creatinine 0.61 - 1.24 mg/dL 1.02  Sodium 135 - 145 mmol/L 128(L)  Potassium 3.5 - 5.1 mmol/L 4.7  Chloride 98 - 111 mmol/L 97(L)  CO2 22 - 32 mmol/L 22  Calcium 8.9 - 10.3 mg/dL 8.3(L)  Total Protein 6.5 - 8.1 g/dL 6.1(L)  Total Bilirubin 0.3 - 1.2 mg/dL 1.4(H)  Alkaline Phos 38 - 126 U/L 44  AST 15 - 41 U/L 16  ALT 0 - 44 U/L 15   CBC Latest Ref Rng & Units 06/21/2018  WBC 3.8 - 10.6 K/uL 7.6  Hemoglobin 13.0 - 18.0 g/dL 13.7  Hematocrit 40.0 - 52.0 % 39.9(L)  Platelets 150 - 440 K/uL 53(L)    No images are attached to the encounter.  No results found.  Assessment and plan- Patient is a 77 y.o. male diagnosed with CLL who presents to symptom management clinic for URI.  1.  Chronic lymphocytic leukemia/small lymphocytic lymphoma-FISH 85% of nuclei positive for 13Q deletion/IGVH unmutated.  PET on 12/30/2017 showed progressive lymphadenopathy in the neck, axilla, mediastinum, and upper abdomen with slightly increased hypermetabolism consistent with  progressive lymphoma.  Splenomegaly worse.  Currently on Gazyva; tolerating well w/o significant complications. Follow up with Dr. [name] for continued monitoring.  2. URI-symptoms consistent with URI. Low grade fever today - 100.6.  In setting of Gazyva with increased risk of infection will initiate treatment with azithromycin.  We discussed symptom support  including increasing fluid intake to help thin secretions, expectorants/guaifenesin (Mucinex), antitussives/dextromethorphan (Robitussin cough), decongestants (afrin), and flonase.  We discussed that he can use Afrin twice a day for 3 days along with simultaneous Flonase twice a day with the expectation of continuing the Flonase after completing the course of Afrin.  This helps to open up the paranasal sinuses and improve outflow and help resolve the infection.  We discussed how edema from viruses can cause sinus obstruction and secondary bacterial infections and ultimately ventilating the sinuses is critical to improving and overcoming the infection.  Given his history of paroxysmal A. Fib (see below), I would not recommend systemic decongestants. If symptoms worsen or do not improve would consider imaging to rule out pneumonia and consider adding Augmentin to macrolide.    3.  Hyponatremia- Na 128, on torsemide and spironolactone for CHF.  May be contributing to dizziness & orthostasis. Continue to monitor.   4.  Paroxysmal A. fib-regular rate and rhythm today without palpitations. Anticoagulated w/ eliquis. Rate control w/ metoprolol. Eliquis currently held due to decreased platelet count. Plt today 53.  Continue to hold.     Patient advised to notify the clinic if there is no improvement in symptoms or if symptoms worsen in next 3-4 days.  Follow-up with Dr. [name] as scheduled.  Visit Diagnosis 1. CLL (chronic lymphocytic leukemia) ([place])   2. Upper respiratory tract infection, unspecified type   3. Hyponatremia   4. Paroxysmal A-fib  [facility])     Patient expressed understanding and was in agreement with this plan. He also understands that He can call clinic at any time with any questions, concerns, or complaints.   Thank you for allowing me to participate in the care of this very pleasant patient.   Beckey Rutter, DNP, AGNP-C [place] [facility] [phone] (work cell) [phone] (office)

## 2018-06-21 NOTE — Telephone Encounter (Signed)
Wife called reporting that patient had chemotherapy last week and that he is full of congestion and needs to be seen. Daughter is a Marine scientist and listened to his lungs and stated he needs to be seen. Had low grade fever yesterday, but not today. Appointment accepted for 130 PM today

## 2018-06-23 ENCOUNTER — Telehealth: Payer: Self-pay | Admitting: *Deleted

## 2018-06-23 NOTE — Telephone Encounter (Signed)
Patient not worse, but definitely no better either, continues to cough and is weak and short of breath. Appointment given for tomorrow midday

## 2018-06-23 NOTE — Telephone Encounter (Signed)
Attempted to call patient to discuss twice w/o answer and no voicemail. Patient on antibiotics through Friday. If no improvement, would like for him to return to clinic then to re-evaluate with labs.  Clifford Herrera, could you try getting back in touch with him? FYI to primary team in case patient/family makes contact.

## 2018-06-23 NOTE — Telephone Encounter (Signed)
Wife called to report that patient is no better than he was 3 days ago when he was seen and received antibiotics. She is asking if he needs to be seen again or if she needs to give antibiotics more time to work. Please advise

## 2018-06-24 ENCOUNTER — Inpatient Hospital Stay (HOSPITAL_BASED_OUTPATIENT_CLINIC_OR_DEPARTMENT_OTHER): Payer: Medicare Other | Admitting: Nurse Practitioner

## 2018-06-24 ENCOUNTER — Ambulatory Visit
Admission: RE | Admit: 2018-06-24 | Discharge: 2018-06-24 | Disposition: A | Discharge status: Home / Self Care | Payer: Medicare Other | Source: Ambulatory Visit | Attending: Nurse Practitioner | Admitting: Nurse Practitioner

## 2018-06-24 ENCOUNTER — Other Ambulatory Visit: Payer: Self-pay | Admitting: *Deleted

## 2018-06-24 ENCOUNTER — Encounter: Payer: Self-pay | Admitting: Nurse Practitioner

## 2018-06-24 ENCOUNTER — Inpatient Hospital Stay: Payer: Medicare Other

## 2018-06-24 VITALS — BP 128/69 | HR 79 | Temp 97.0°F | Resp 20 | Wt 217.5 lb

## 2018-06-24 DIAGNOSIS — J069 Acute upper respiratory infection, unspecified: Secondary | ICD-10-CM

## 2018-06-24 DIAGNOSIS — J189 Pneumonia, unspecified organism: Secondary | ICD-10-CM | POA: Diagnosis not present

## 2018-06-24 DIAGNOSIS — I509 Heart failure, unspecified: Secondary | ICD-10-CM | POA: Diagnosis not present

## 2018-06-24 DIAGNOSIS — J181 Lobar pneumonia, unspecified organism: Secondary | ICD-10-CM

## 2018-06-24 DIAGNOSIS — I429 Cardiomyopathy, unspecified: Secondary | ICD-10-CM

## 2018-06-24 DIAGNOSIS — R05 Cough: Secondary | ICD-10-CM | POA: Insufficient documentation

## 2018-06-24 DIAGNOSIS — C9110 Chronic lymphocytic leukemia of B-cell type not having achieved remission: Secondary | ICD-10-CM

## 2018-06-24 DIAGNOSIS — I480 Paroxysmal atrial fibrillation: Secondary | ICD-10-CM

## 2018-06-24 DIAGNOSIS — C9190 Lymphoid leukemia, unspecified not having achieved remission: Secondary | ICD-10-CM | POA: Diagnosis present

## 2018-06-24 DIAGNOSIS — E871 Hypo-osmolality and hyponatremia: Secondary | ICD-10-CM

## 2018-06-24 DIAGNOSIS — I700 Atherosclerosis of aorta: Secondary | ICD-10-CM | POA: Insufficient documentation

## 2018-06-24 DIAGNOSIS — R350 Frequency of micturition: Secondary | ICD-10-CM

## 2018-06-24 DIAGNOSIS — Z87891 Personal history of nicotine dependence: Secondary | ICD-10-CM

## 2018-06-24 DIAGNOSIS — R42 Dizziness and giddiness: Secondary | ICD-10-CM

## 2018-06-24 DIAGNOSIS — Z5112 Encounter for antineoplastic immunotherapy: Secondary | ICD-10-CM | POA: Diagnosis not present

## 2018-06-24 LAB — URINALYSIS, COMPLETE (UACMP) WITH MICROSCOPIC
Bacteria, UA: NONE SEEN
Bilirubin Urine: NEGATIVE
Glucose, UA: NEGATIVE mg/dL
KETONES UR: 5 mg/dL — AB
Leukocytes, UA: NEGATIVE
NITRITE: NEGATIVE
PH: 5 (ref 5.0–8.0)
Protein, ur: 30 mg/dL — AB
Specific Gravity, Urine: 1.023 (ref 1.005–1.030)

## 2018-06-24 LAB — COMPREHENSIVE METABOLIC PANEL
ALT: 20 U/L (ref 0–44)
ANION GAP: 10 (ref 5–15)
AST: 21 U/L (ref 15–41)
Albumin: 3.6 g/dL (ref 3.5–5.0)
Alkaline Phosphatase: 60 U/L (ref 38–126)
BILIRUBIN TOTAL: 1.2 mg/dL (ref 0.3–1.2)
BUN: 27 mg/dL — ABNORMAL HIGH (ref 8–23)
CO2: 22 mmol/L (ref 22–32)
Calcium: 8 mg/dL — ABNORMAL LOW (ref 8.9–10.3)
Chloride: 93 mmol/L — ABNORMAL LOW (ref 98–111)
Creatinine, Ser: 1.18 mg/dL (ref 0.61–1.24)
GFR calc non Af Amer: 58 mL/min — ABNORMAL LOW (ref >60)
Glucose, Bld: 105 mg/dL — ABNORMAL HIGH (ref 70–99)
Potassium: 4.4 mmol/L (ref 3.5–5.1)
Sodium: 125 mmol/L — ABNORMAL LOW (ref 135–145)
TOTAL PROTEIN: 6.2 g/dL — AB (ref 6.5–8.1)

## 2018-06-24 LAB — CBC
HCT: 40.8 % (ref 40.0–52.0)
Hemoglobin: 13.8 g/dL (ref 13.0–18.0)
MCH: 28.4 pg (ref 26.0–34.0)
MCHC: 33.9 g/dL (ref 32.0–36.0)
MCV: 83.9 fL (ref 80.0–100.0)
Platelets: 86 10*3/uL — ABNORMAL LOW (ref 150–440)
RBC: 4.87 MIL/uL (ref 4.40–5.90)
RDW: 15.6 % — ABNORMAL HIGH (ref 11.5–14.5)
WBC: 9.6 10*3/uL (ref 3.8–10.6)

## 2018-06-24 LAB — BRAIN NATRIURETIC PEPTIDE: B Natriuretic Peptide: 992 pg/mL — ABNORMAL HIGH (ref 0.0–100.0)

## 2018-06-24 LAB — MAGNESIUM: MAGNESIUM: 2.5 mg/dL — AB (ref 1.7–2.4)

## 2018-06-24 MED ORDER — LEVOFLOXACIN 500 MG PO TABS: 500.0000 mg | ORAL_TABLET | Freq: Every day | ORAL | 0 refills | Status: AC

## 2018-06-24 NOTE — Progress Notes (Signed)
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 Surveillance; [facility]- UNMUTATED [dec 2017]  # AUG 2018- FISH panel analysis was positive for loss of one 13q14 signal. NEGATIVE for CCND1/IGH, ATM, chromosome 12, and TP53 were normal. MAY 2019- : 85% OF NUCLEI POSITIVE FOR A 13Q DELETION  #CHF/CAD s/p Defib- on Eliquis  [Dr.Kowalski]; Orthostatic hypotension   -------------------------------------------------------------   DIAGNOSIS: CLL  STAGE:  IV ;GOALS: CONTROL/palliative  CURRENT/MOST RECENT THERAPY;: July 29th, 2019 GAZYVA      CLL (chronic lymphocytic leukemia) ([place])   04/25/2018 -  Chemotherapy    The patient had obinutuzumab (GAZYVA) 100 mg in sodium chloride 0.9 % 100 mL (0.9615 mg/mL) chemo infusion, 100 mg, Intravenous, Once, 2 of 6 cycles Administration: 100 mg (04/26/2018), 900 mg (04/27/2018), 1,000 mg (06/14/2018), 1,000 mg (06/02/2018)  for chemotherapy treatment.      Interval history- Clifford Herrera, 77 year old male, with [name], who presents to symptom management clinic for evaluation and treatment of possible pneumonia.  Symptoms include, productive cough with white, dizziness, weight gain, swelling of bilateral lower extremities.  He denies chest tightness or wheezing.  He was previously seen in symptom management clinic and  prescribed azithromycin for possible URI.  He states that symptoms have persisted and not improved.  He was reporting low-grade fevers at that time.  No fever today but dizziness is worse.  He has been taking Robitussin, Mucinex, Flonase, and Afrin (stopped after 3 days).  He has a history of CHF and takes torsemide and spironolactone chronically.  Currently on metoprolol 25 mg twice daily and Eliquis for history of A. fib.  Previously he was in normal sinus rhythm and Eliquis held due to low platelet count.  He continues allopurinol and acyclovir for prophylaxis.  Wife reports that he is persistently fatigued and sleeps off and on throughout the day.  Patient says he just "feels worse".  ECOG FS:2 - Symptomatic, <50% confined to bed  Review of systems- Review of Systems  Constitutional: Positive for fever and malaise/fatigue. Negative for chills and weight loss.  HENT: Positive for congestion and hearing loss. Negative for ear discharge, ear pain, sinus pain, sore throat and tinnitus.   Eyes: Negative.   Respiratory: Positive for cough, sputum production and shortness of breath. Negative for wheezing and stridor.   Cardiovascular: Positive for leg swelling. Negative for chest pain, palpitations, orthopnea and claudication.  Gastrointestinal: Negative for abdominal pain, blood in stool, constipation, diarrhea, heartburn, nausea and vomiting.  Genitourinary: Negative.   Musculoskeletal: Negative.   Skin: Negative.   Neurological: Positive for dizziness and weakness. Negative for tingling and headaches.  Endo/Heme/Allergies: Negative.   Psychiatric/Behavioral: Negative.      Current treatment- Gazyva on 06/14/18  No Known Allergies  Past Medical History:  Diagnosis Date  . CLL (chronic lymphocytic leukemia) ([place])   . CPAP (continuous positive airway pressure) dependence   . Heart failure ([place])   .  HOH (hard of hearing)   . Presence of automatic (implantable) cardiac defibrillator   . Sleep  apnea   . Upper respiratory infection    chronic    Past Surgical History:  Procedure Laterality Date  . CARDIAC DEFIBRILLATOR PLACEMENT  2015    Social History   Socioeconomic History  . Marital status: Married    Spouse name: Not on file  . Number of children: 3  . Years of education: Not on file  . Highest education level: Bachelor's degree (e.g., BA, AB, BS)  Occupational History  . Occupation: Retired  Scientific laboratory technician  . Financial resource strain: Not hard at all  . Food insecurity:    Worry: Never true    Inability: Never true  . Transportation needs:    Medical: No    Non-medical: No  Tobacco Use  . Smoking status: Former Smoker    Packs/day: 2.00    Years: 42.00    Pack years: 84.00    Types: Cigarettes    Last attempt to quit: 1991    Years since quitting: 28.7  . Smokeless tobacco: Current User    Types: Chew  . Tobacco comment: smoking cessation materials not required  Substance and Sexual Activity  . Alcohol use: Yes    Comment: 6 beers per month  . Drug use: No  . Sexual activity: Not Currently    Comment: quit about 20 years ago  Lifestyle  . Physical activity:    Days per week: 0 days    Minutes per session: 0 min  . Stress: Not at all  Relationships  . Social connections:    Talks on phone: Patient refused    Gets together: Patient refused    Attends religious service: Patient refused    Active member of club or organization: Patient refused    Attends meetings of clubs or organizations: Patient refused    Relationship status: Patient refused  . Intimate partner violence:    Fear of current or ex partner: No    Emotionally abused: No    Physically abused: No    Forced sexual activity: No  Other Topics Concern  . Not on file  Social History Narrative  . Not on file    Family History  Problem Relation Age of Onset  . Diabetes Mother   . CAD Mother   . Diabetes Father   . CAD Father      Current Outpatient Medications:  .   acyclovir (ZOVIRAX) 400 MG tablet, One pill a day [to prevent shingles], Disp: 30 tablet, Rfl: 3 .  allopurinol (ZYLOPRIM) 100 MG tablet, TAKE 1 TABLET BY MOUTH ONCE EVERY EVENING, Disp: , Rfl:  .  apixaban (ELIQUIS) 5 MG TABS tablet, TAKE 1 TABLET EVERY 12 HOURS, Disp: , Rfl:  .  dexamethasone (DECADRON) 4 MG tablet, Take 1 tablet (4 mg total) by mouth daily. Take 1 tablet starting 2 days prior to infusion. Do not take on day of infusion, Disp: 30 tablet, Rfl: 1 .  guaiFENesin (MUCINEX) 600 MG 12 hr tablet, Take by mouth 2 (two) times daily., Disp: , Rfl:  .  guaifenesin (ROBITUSSIN) 100 MG/5ML syrup, Take 200 mg by mouth 2 (two) times daily., Disp: , Rfl:  .  levothyroxine (SYNTHROID, LEVOTHROID) 75 MCG tablet, TAKE 1 TABLET BY MOUTH ONCE DAILY BEFOREBREAKFAST., Disp: 90 tablet, Rfl: 3 .  metoprolol tartrate (LOPRESSOR) 25 MG tablet, Take 25 mg 2 (two) times daily by mouth., Disp: , Rfl:  .  montelukast (SINGULAIR) 10 MG tablet, Take 1 tablet (10 mg total) by mouth at bedtime. Take 1 tablet starting 2 days prior to infusion x 4 days., Disp: 30 tablet, Rfl: 2 .  rOPINIRole (REQUIP) 0.25 MG tablet, TAKE 2 TABLETS BY MOUTH AT BEDTIME, Disp: 60 tablet, Rfl: 0 .  spironolactone (ALDACTONE) 25 MG tablet, TAKE 1/2 TABLET BY MOUTH ONCE DAILY, Disp: 45 tablet, Rfl: 3 .  tamsulosin (FLOMAX) 0.4 MG CAPS capsule, Take 1 capsule (0.4 mg total) by mouth daily after supper., Disp: 90 capsule, Rfl: 3 .  torsemide (DEMADEX) 20 MG tablet, Take 10 mg by mouth daily. , Disp: , Rfl:  .  Vitamin D, Cholecalciferol, 1000 units CAPS, Take 2 capsules by mouth daily., Disp: 60 capsule, Rfl:  .  levofloxacin (LEVAQUIN) 500 MG tablet, Take 1 tablet (500 mg total) by mouth daily for 10 days., Disp: 10 tablet, Rfl: 0  Physical exam:  Vitals:   06/24/18 1117 06/24/18 1120 06/24/18 1517  BP: 128/69 128/69   Pulse: 77 79   Resp: 20    Temp: (!) 97 F (36.1 C)    TempSrc: Tympanic    SpO2: 97%    Weight:   217 lb 8 oz  (98.7 kg)   Physical Exam  Constitutional: He is oriented to person, place, and time. He appears well-developed and well-nourished. No distress.  accompanied  HENT:  Head: Normocephalic and atraumatic.  Nose: Nose normal.  Mouth/Throat: Oropharynx is clear and moist. No oropharyngeal exudate.  Eyes: Pupils are equal, round, and reactive to light. Conjunctivae and EOM are normal. No scleral icterus.  Neck: Normal range of motion. Neck supple.  Cardiovascular: An irregularly irregular rhythm present.  Pulmonary/Chest: No respiratory distress. He has decreased breath sounds in the left lower field. He has no wheezes.  Strong; deep cough w/ sputum  Abdominal: Soft. Bowel sounds are normal. He exhibits no distension. There is no tenderness.  Musculoskeletal: Normal range of motion. He exhibits edema (BLE edema 1-2+).  Neurological: He is oriented to person, place, and time.  Weaker than previous exams  Skin: Skin is warm and dry. There is pallor.  Psychiatric: He has a normal mood and affect. His behavior is normal.     CMP Latest Ref Rng & Units 06/24/2018  Glucose 70 - 99 mg/dL 105(H)  BUN 8 - 23 mg/dL 27(H)  Creatinine 0.61 - 1.24 mg/dL 1.18  Sodium 135 - 145 mmol/L 125(L)  Potassium 3.5 - 5.1 mmol/L 4.4  Chloride 98 - 111 mmol/L 93(L)  CO2 22 - 32 mmol/L 22  Calcium 8.9 - 10.3 mg/dL 8.0(L)  Total Protein 6.5 - 8.1 g/dL 6.2(L)  Total Bilirubin 0.3 - 1.2 mg/dL 1.2  Alkaline Phos 38 - 126 U/L 60  AST 15 - 41 U/L 21  ALT 0 - 44 U/L 20   CBC Latest Ref Rng & Units 06/24/2018  WBC 3.8 - 10.6 K/uL 9.6  Hemoglobin 13.0 - 18.0 g/dL 13.8  Hematocrit 40.0 - 52.0 % 40.8  Platelets 150 - 440 K/uL 86(L)    No images are attached to the encounter.  Dg Chest 2 View  Result Date: 06/24/2018 CLINICAL DATA:  Three days of cough. History of CLL, CHF, coronary artery disease, former smoker. EXAM: CHEST - 2 VIEW COMPARISON:  PA and lateral chest x-ray of August 08, 2017 FINDINGS: The lungs  are well-expanded. There is dense infiltrate posterior medially in the left lower lobe. There is no significant pleural effusion. The heart and pulmonary  vascularity are normal. There is calcification in the wall of the aortic arch. The ICD is in stable position. The bony thorax exhibits no acute abnormality. IMPRESSION: Left lower lobe pneumonia. Followup PA and lateral chest X-ray is recommended in 3-4 weeks following trial of antibiotic therapy to ensure resolution and exclude underlying malignancy. Thoracic aortic atherosclerosis. Electronically Signed   By: David  Martinique M.D.   On: 06/24/2018 12:41    Assessment and plan- Patient is a 77 y.o. male diagnosis CLL who presents to symptom management clinic for pneumonia.  1. 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  12/2017 PET shows massive splenomegaly/mild progression of cervical, axillary, mediastinal/retroperitoneal, and periportal adenopathy.  Currently on Gazyva. Platelets uptrending.  On allopurinol and acyclovir for prophylaxis.  Continue.  2.  Left lower lobe pneumonia-decreased left lower lobe lung sounds and ill-appearing.  Imaging consistent with pneumonia.  Has completed azithromycin.  Discussed with Dr. [name] who recommends starting Levaquin 571m PO daily x 10 days and will have patient rtc on 06/28/18 for re-evaluation.  Will defer follow-up imaging to assess resolution of pneumonia to Dr. BRogue Bussing  3. Hyponatremia- Na 125 today. On torsemide and spironolactone for CHF.  Dizziness and weakness today worse. Would hold off on IV fluids given history of CHF (see below).  Hold torsemide until reevaluation.  4. A fib-on Eliquis and metoprolol.  On previous exam regular rate and rhythm.  Eliquis previously held by Dr. BRogue Bussingdue to thrombocytopenia.  Today, irregularly irregular rhythm.  Question if decongestants contributed.  Platelet count today 86.  Discussed with Dr. BRogue Bussingwho  recommends restarting Eliquis.  In setting of dizziness and orthostasis, discussed and recommended changing positions slowly and given fall precautions.   5. CHF- weight trending up, BLE edema. Hx of CHF. Pro BNP 07/28/14 was 3,582. Today, BNP 992.  Per Dr. BRogue Bussing no crackles on exam or congestion on imaging.  Will recheck BNP next week.  6.  Urinary frequency-patient reporting urinary frequency to nursing.  UA collected.  Negative for infection.  Hematuria present consistent with previous. On diuretics for chf. No pain or dysuria. Continue to monitor.   Dr. BRogue Bussingindependently assessed patient, reviewed labs and imaging, and contributed to & agreed to plan of care.   If worsening symptoms overnight, would like patient to be seen in ER or return to symptom management clinic for consideration of hospitalization.  Otherwise, return to clinic on 06/28/2018 for reevaluation and repeat labs.  Visit Diagnosis 1. C[place]ocytic [place]) [place]   2. Pneumonia of left lower lobe due to infectious organism ([place]   3. Hyponatremia   4. Paroxysmal A-fib ([place]   5. 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 Upper respiratory tract infection, unspecified type   7. Frequency of urination     Patient expressed understanding and was in agreement with this plan. He also understands that He can call clinic at any time with any questions, concerns, or complaints.   Thank you for allowing me to participate in the care of this very pleasant patient.   LBeckey Rutter DNP, AGNP-C [facility]ow[phone](work cell) [phone](office)

## 2018-06-25 ENCOUNTER — Ambulatory Visit: Payer: Medicare Other | Admitting: Internal Medicine

## 2018-06-25 LAB — URINE CULTURE: Culture: NO GROWTH

## 2018-06-28 ENCOUNTER — Encounter: Payer: Self-pay | Admitting: Nurse Practitioner

## 2018-06-28 ENCOUNTER — Other Ambulatory Visit: Payer: Self-pay | Admitting: *Deleted

## 2018-06-28 ENCOUNTER — Inpatient Hospital Stay (HOSPITAL_BASED_OUTPATIENT_CLINIC_OR_DEPARTMENT_OTHER): Payer: Medicare Other | Admitting: Nurse Practitioner

## 2018-06-28 ENCOUNTER — Inpatient Hospital Stay: Payer: Medicare Other

## 2018-06-28 VITALS — BP 112/70 | HR 102 | Temp 97.5°F | Resp 20 | Wt 219.0 lb

## 2018-06-28 DIAGNOSIS — N183 Chronic kidney disease, stage 3 unspecified: Secondary | ICD-10-CM

## 2018-06-28 DIAGNOSIS — J181 Lobar pneumonia, unspecified organism: Secondary | ICD-10-CM

## 2018-06-28 DIAGNOSIS — I429 Cardiomyopathy, unspecified: Secondary | ICD-10-CM

## 2018-06-28 DIAGNOSIS — C9110 Chronic lymphocytic leukemia of B-cell type not having achieved remission: Secondary | ICD-10-CM | POA: Diagnosis not present

## 2018-06-28 DIAGNOSIS — I480 Paroxysmal atrial fibrillation: Secondary | ICD-10-CM

## 2018-06-28 DIAGNOSIS — E871 Hypo-osmolality and hyponatremia: Secondary | ICD-10-CM

## 2018-06-28 DIAGNOSIS — E039 Hypothyroidism, unspecified: Secondary | ICD-10-CM

## 2018-06-28 DIAGNOSIS — R5383 Other fatigue: Secondary | ICD-10-CM

## 2018-06-28 DIAGNOSIS — R42 Dizziness and giddiness: Secondary | ICD-10-CM

## 2018-06-28 DIAGNOSIS — I509 Heart failure, unspecified: Secondary | ICD-10-CM

## 2018-06-28 DIAGNOSIS — J189 Pneumonia, unspecified organism: Secondary | ICD-10-CM

## 2018-06-28 DIAGNOSIS — Z5112 Encounter for antineoplastic immunotherapy: Secondary | ICD-10-CM | POA: Diagnosis not present

## 2018-06-28 DIAGNOSIS — Z87891 Personal history of nicotine dependence: Secondary | ICD-10-CM

## 2018-06-28 DIAGNOSIS — J069 Acute upper respiratory infection, unspecified: Secondary | ICD-10-CM

## 2018-06-28 DIAGNOSIS — C8303 Small cell B-cell lymphoma, intra-abdominal lymph nodes: Secondary | ICD-10-CM

## 2018-06-28 LAB — CBC WITH DIFFERENTIAL/PLATELET
Basophils Absolute: 0 10*3/uL (ref 0–0.1)
Basophils Relative: 0 %
EOS PCT: 1 %
Eosinophils Absolute: 0 10*3/uL (ref 0–0.7)
HCT: 39.3 % — ABNORMAL LOW (ref 40.0–52.0)
Hemoglobin: 13.4 g/dL (ref 13.0–18.0)
LYMPHS PCT: 10 %
Lymphs Abs: 0.5 10*3/uL — ABNORMAL LOW (ref 1.0–3.6)
MCH: 28.3 pg (ref 26.0–34.0)
MCHC: 34.1 g/dL (ref 32.0–36.0)
MCV: 83 fL (ref 80.0–100.0)
MONO ABS: 0.3 10*3/uL (ref 0.2–1.0)
Monocytes Relative: 7 %
Neutro Abs: 4.4 10*3/uL (ref 1.4–6.5)
Neutrophils Relative %: 82 %
PLATELETS: 116 10*3/uL — AB (ref 150–440)
RBC: 4.73 MIL/uL (ref 4.40–5.90)
RDW: 15.1 % — AB (ref 11.5–14.5)
WBC: 5.3 10*3/uL (ref 3.8–10.6)

## 2018-06-28 LAB — COMPREHENSIVE METABOLIC PANEL
ALBUMIN: 3.1 g/dL — AB (ref 3.5–5.0)
ALK PHOS: 81 U/L (ref 38–126)
ALT: 29 U/L (ref 0–44)
AST: 30 U/L (ref 15–41)
Anion gap: 8 (ref 5–15)
BILIRUBIN TOTAL: 0.8 mg/dL (ref 0.3–1.2)
BUN: 17 mg/dL (ref 8–23)
CALCIUM: 7.7 mg/dL — AB (ref 8.9–10.3)
CO2: 24 mmol/L (ref 22–32)
CREATININE: 0.96 mg/dL (ref 0.61–1.24)
Chloride: 93 mmol/L — ABNORMAL LOW (ref 98–111)
GFR calc Af Amer: >60 mL/min (ref >60)
GFR calc non Af Amer: >60 mL/min (ref >60)
GLUCOSE: 110 mg/dL — AB (ref 70–99)
Potassium: 4.7 mmol/L (ref 3.5–5.1)
Sodium: 125 mmol/L — ABNORMAL LOW (ref 135–145)
TOTAL PROTEIN: 5.5 g/dL — AB (ref 6.5–8.1)

## 2018-06-28 LAB — TSH: TSH: 0.958 u[IU]/mL (ref 0.350–4.500)

## 2018-06-28 LAB — BRAIN NATRIURETIC PEPTIDE: B Natriuretic Peptide: 987 pg/mL — ABNORMAL HIGH (ref 0.0–100.0)

## 2018-06-28 NOTE — Progress Notes (Signed)
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 Surveillance; [facility]- UNMUTATED [dec 2017]  # AUG 2018- FISH panel analysis was positive for loss of one 13q14 signal. NEGATIVE for CCND1/IGH, ATM, chromosome 12, and TP53 were normal. MAY 2019- : 85% OF NUCLEI POSITIVE FOR A 13Q DELETION  #CHF/CAD s/p Defib- on Eliquis  [Dr.Kowalski]; Orthostatic hypotension   -------------------------------------------------------------   DIAGNOSIS: CLL  STAGE:  IV ;GOALS: CONTROL/palliative  CURRENT/MOST RECENT THERAPY;: July 29th, 2019 GAZYVA      CLL (chronic lymphocytic leukemia) ([place])   04/25/2018 -  Chemotherapy    The patient had obinutuzumab (GAZYVA) 100 mg in sodium chloride 0.9 % 100 mL (0.9615 mg/mL) chemo infusion, 100 mg, Intravenous, Once, 2 of 6 cycles Administration: 100 mg (04/26/2018), 900 mg (04/27/2018), 1,000 mg (06/14/2018), 1,000 mg (06/02/2018)  for chemotherapy treatment.      Interval history- Clifford Herrera, 77 year old male, with [name], presents to symptom management clinic after being diagnosed with pneumonia last week.  Reports that on Friday he became dizzy and fell while picking up medications from the pharmacy and "bump to the back of his head".  Since starting antibiotics he is noticed that his cough is improved and overall  he is feeling better.  Denies any fever over the weekend.  He has been increasing his intake of fluids and is been drinking Gatorade for sodium.  He had held his torsemide.  He continues to feel dizzy today but has not had falls and feels it is slightly improved from previous.  He is persistently fatigued.  He has restarted his Eliquis and has not noticed any bleeding, or bruising.  No black or tarry like stools.  Wife states she noticed pink-colored urine in his underwear for the weekend.   ECOG FS:2 - Symptomatic, <50% confined to bed  Review of systems- Review of Systems  Constitutional: Positive for malaise/fatigue. Negative for chills, fever and weight loss.  HENT: Positive for hearing loss (chronic). Negative for congestion, ear discharge, ear pain, sinus pain, sore throat and tinnitus.   Eyes: Negative.   Respiratory: Positive for cough, sputum production and shortness of breath. Negative for wheezing and stridor.   Cardiovascular: Positive for leg swelling. Negative for chest pain, palpitations, orthopnea and claudication.  Gastrointestinal: Negative for abdominal pain, blood in stool, constipation, diarrhea, heartburn, nausea and vomiting.  Genitourinary: Negative.   Musculoskeletal: Negative.   Skin: Negative.   Neurological: Positive for dizziness and weakness. Negative for tingling and headaches.  Endo/Heme/Allergies: Negative.   Psychiatric/Behavioral: Negative.      Current treatment- Gazyva on 06/14/18  No Known Allergies  Past Medical History:  Diagnosis Date  . CLL (chronic lymphocytic leukemia) ([place])   . CPAP (continuous positive airway pressure) dependence   . Heart failure ([place])   . HOH (hard of hearing)   . Presence of automatic (implantable) cardiac defibrillator   . Sleep apnea   .  Upper respiratory infection    chronic    Past Surgical History:  Procedure Laterality Date  . CARDIAC DEFIBRILLATOR PLACEMENT  2015    Social History   Socioeconomic History   . Marital status: Married    Spouse name: Not on file  . Number of children: 3  . Years of education: Not on file  . Highest education level: Bachelor's degree (e.g., BA, AB, BS)  Occupational History  . Occupation: Retired  Scientific laboratory technician  . Financial resource strain: Not hard at all  . Food insecurity:    Worry: Never true    Inability: Never true  . Transportation needs:    Medical: No    Non-medical: No  Tobacco Use  . Smoking status: Former Smoker    Packs/day: 2.00    Years: 42.00    Pack years: 84.00    Types: Cigarettes    Last attempt to quit: 1991    Years since quitting: 28.7  . Smokeless tobacco: Current User    Types: Chew  . Tobacco comment: smoking cessation materials not required  Substance and Sexual Activity  . Alcohol use: Yes    Comment: 6 beers per month  . Drug use: No  . Sexual activity: Not Currently    Comment: quit about 20 years ago  Lifestyle  . Physical activity:    Days per week: 0 days    Minutes per session: 0 min  . Stress: Not at all  Relationships  . Social connections:    Talks on phone: Patient refused    Gets together: Patient refused    Attends religious service: Patient refused    Active member of club or organization: Patient refused    Attends meetings of clubs or organizations: Patient refused    Relationship status: Patient refused  . Intimate partner violence:    Fear of current or ex partner: No    Emotionally abused: No    Physically abused: No    Forced sexual activity: No  Other Topics Concern  . Not on file  Social History Narrative  . Not on file    Family History  Problem Relation Age of Onset  . Diabetes Mother   . CAD Mother   . Diabetes Father   . CAD Father      Current Outpatient Medications:  .  acyclovir (ZOVIRAX) 400 MG tablet, One pill a day [to prevent shingles], Disp: 30 tablet, Rfl: 3 .  allopurinol (ZYLOPRIM) 100 MG tablet, TAKE 1 TABLET BY MOUTH ONCE EVERY EVENING, Disp: , Rfl:  .   apixaban (ELIQUIS) 5 MG TABS tablet, TAKE 1 TABLET EVERY 12 HOURS, Disp: , Rfl:  .  dexamethasone (DECADRON) 4 MG tablet, Take 1 tablet (4 mg total) by mouth daily. Take 1 tablet starting 2 days prior to infusion. Do not take on day of infusion, Disp: 30 tablet, Rfl: 1 .  Dextromethorphan Polistirex (ROBITUSSIN 12 HOUR COUGH PO), Take by mouth., Disp: , Rfl:  .  guaiFENesin (MUCINEX) 600 MG 12 hr tablet, Take by mouth 2 (two) times daily., Disp: , Rfl:  .  levofloxacin (LEVAQUIN) 500 MG tablet, Take 1 tablet (500 mg total) by mouth daily for 10 days., Disp: 10 tablet, Rfl: 0 .  levothyroxine (SYNTHROID, LEVOTHROID) 75 MCG tablet, TAKE 1 TABLET BY MOUTH ONCE DAILY BEFOREBREAKFAST., Disp: 90 tablet, Rfl: 3 .  metoprolol tartrate (LOPRESSOR) 25 MG tablet, Take 25 mg 2 (two) times daily by mouth., Disp: , Rfl:  .  montelukast (  SINGULAIR) 10 MG tablet, Take 1 tablet (10 mg total) by mouth at bedtime. Take 1 tablet starting 2 days prior to infusion x 4 days., Disp: 30 tablet, Rfl: 2 .  rOPINIRole (REQUIP) 0.25 MG tablet, TAKE 2 TABLETS BY MOUTH AT BEDTIME, Disp: 60 tablet, Rfl: 0 .  spironolactone (ALDACTONE) 25 MG tablet, TAKE 1/2 TABLET BY MOUTH ONCE DAILY, Disp: 45 tablet, Rfl: 3 .  tamsulosin (FLOMAX) 0.4 MG CAPS capsule, Take 1 capsule (0.4 mg total) by mouth daily after supper., Disp: 90 capsule, Rfl: 3 .  Vitamin D, Cholecalciferol, 1000 units CAPS, Take 2 capsules by mouth daily., Disp: 60 capsule, Rfl:  .  torsemide (DEMADEX) 20 MG tablet, Take 10 mg by mouth daily. , Disp: , Rfl:   Physical exam:  Vitals:   06/28/18 0946 06/28/18 1024  BP: 112/70   Pulse: (!) 102   Resp:  20  Temp: (!) 97.5 F (36.4 C)   TempSrc: Oral   SpO2: 95%   Weight: 219 lb (99.3 kg)    Physical Exam  Constitutional: He is oriented to person, place, and time. He appears well-developed and well-nourished. No distress.  accompanied  HENT:  Head: Normocephalic and atraumatic.  Nose: Nose normal.  Mouth/Throat:  Oropharynx is clear and moist. No oropharyngeal exudate.  Eyes: Pupils are equal, round, and reactive to light. Conjunctivae and EOM are normal. No scleral icterus.  Neck: Normal range of motion. Neck supple.  Cardiovascular: An irregularly irregular rhythm present.  Pulmonary/Chest: Effort normal. No respiratory distress. He has decreased breath sounds in the left lower field. He has no wheezes.  Coughing less frequently  Abdominal: Soft. Bowel sounds are normal. He exhibits no distension. There is no tenderness.  Musculoskeletal: Normal range of motion. He exhibits edema (BLE edema 1-2+).  Neurological: He is alert and oriented to person, place, and time.  Skin: Skin is warm and dry.  Psychiatric: He has a normal mood and affect. His behavior is normal.     CMP Latest Ref Rng & Units 06/28/2018  Glucose 70 - 99 mg/dL 110(H)  BUN 8 - 23 mg/dL 17  Creatinine 0.61 - 1.24 mg/dL 0.96  Sodium 135 - 145 mmol/L 125(L)  Potassium 3.5 - 5.1 mmol/L 4.7  Chloride 98 - 111 mmol/L 93(L)  CO2 22 - 32 mmol/L 24  Calcium 8.9 - 10.3 mg/dL 7.7(L)  Total Protein 6.5 - 8.1 g/dL 5.5(L)  Total Bilirubin 0.3 - 1.2 mg/dL 0.8  Alkaline Phos 38 - 126 U/L 81  AST 15 - 41 U/L 30  ALT 0 - 44 U/L 29   CBC Latest Ref Rng & Units 06/28/2018  WBC 3.8 - 10.6 K/uL 5.3  Hemoglobin 13.0 - 18.0 g/dL 13.4  Hematocrit 40.0 - 52.0 % 39.3(L)  Platelets 150 - 440 K/uL 116(L)    No images are attached to the encounter.  Dg Chest 2 View  Result Date: 06/24/2018 CLINICAL DATA:  Three days of cough. History of CLL, CHF, coronary artery disease, former smoker. EXAM: CHEST - 2 VIEW COMPARISON:  PA and lateral chest x-ray of August 08, 2017 FINDINGS: The lungs are well-expanded. There is dense infiltrate posterior medially in the left lower lobe. There is no significant pleural effusion. The heart and pulmonary vascularity are normal. There is calcification in the wall of the aortic arch. The ICD is in stable position. The  bony thorax exhibits no acute abnormality. IMPRESSION: Left lower lobe pneumonia. Followup PA and lateral chest X-ray is recommended in 3-4  weeks following trial of antibiotic therapy to ensure resolution and exclude underlying malignancy. Thoracic aortic atherosclerosis. Electronically Signed   By: David  Martinique M.D.   On: 06/24/2018 12:41    Assessment and plan- Patient is a 77 y.o. male diagnosis CLL who presents to symptom management clinic for pneumonia & dizziness.   1. 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  12/2017 PET shows massive splenomegaly/mild progression of cervical, axillary, mediastinal/retroperitoneal, and periportal adenopathy.  Currently on Gazyva. Platelets uptrending. Continue allopurinol and acyclovir for prophylaxis.   2.  Left lower lobe pneumonia- per chest x-ray on 06/24/18. Symptomatically improving. WBC 5.3 today, afebrile. Tolerating levaquin well- continue for total of 10 days of treatment. Continue mucinex and robitussin cough for symptom support. Advised patient of need for repeat chest x-ray end of October/first of November to ensure resolution.   3. Hyponatremia- Na 125 today. Symptomatic. On torsemide and spironolactone for CHF.- torsemide held over weekend. Continue to hold torsemide. Restrict fluids to 1500cc. Will recheck labs later this week.   4. Paroxysmal atrial fib- still in a fib today. TSH today 0.958 (previously 6.250)- on levothyroxine. Eliquis restarted on 06/24/18 given rising platelets. Plts improved again today. On metoprolol 25 mg BID. Discussed with Dr. [name] who recommends increasing dose to one and a half tablets (37.45m) for rate control. Continue Eliquis. Follow up with Dr. PVicente Massonas scheduled.   5. CHF- w/ cardiomyopathy; Managed by Dr. PVicente Masson weight today 219 lb, persistent BLE edema. Discussed leg elevation and compression stockings. No crackles or evidence of fluid overload today. BNP today  987.0 (improved from 06/24/18). Continue to hold torsemide in setting of hyponatremia. Continue spironolactone.   6. Hematuria- Hematuria on recent UA and family report of 'pink dribble'; no clots. No flank pain. Negative for infection. Hx of CKD stage 3. GFR today > 60, Cr 0.96, BUN 17. Improved. Continue to monitor.   Discussed history, physical exam, and lab results with Dr. BRogue Bussingwho contributed to and agreed with plan of care.  We will have patient return to clinic in 3 days for labs and reevaluation.  Visit Diagnosis 1. CLL (chronic lymphocytic leukemia) ([place]   2. Pneumonia of left lower lobe due to infectious organism ([facility]   3. Hyponatremia   4. Paroxysmal A-fib ([place]   5. Congestive heart failure with cardiomyopathy ([place]   6. Hypothyroidism, unspecified type   7. CKD (chronic kidney disease) stage 3, GFR 30-59 ml/min ([facility])     Patient expressed understanding and was in agreement with this plan. He also understands that He can call clinic at any time with any questions, concerns, or complaints.   Thank you for allowing me to participate in the care of this very pleasant patient.   LBeckey Rutter DNP, AGNP-C CNotre Dameat AMid[phone](work cell) [phone](office)

## 2018-06-29 ENCOUNTER — Encounter: Payer: Self-pay | Admitting: Nurse Practitioner

## 2018-06-29 DIAGNOSIS — E871 Hypo-osmolality and hyponatremia: Secondary | ICD-10-CM | POA: Insufficient documentation

## 2018-06-29 DIAGNOSIS — J189 Pneumonia, unspecified organism: Secondary | ICD-10-CM | POA: Insufficient documentation

## 2018-06-29 DIAGNOSIS — J181 Lobar pneumonia, unspecified organism: Secondary | ICD-10-CM

## 2018-06-30 NOTE — Progress Notes (Signed)
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 Surveillance; [facility]- UNMUTATED [dec 2017]  # AUG 2018- FISH panel analysis was positive for loss of one 13q14 signal. NEGATIVE for CCND1/IGH, ATM, chromosome 12, and TP53 were normal. MAY 2019- : 85% OF NUCLEI POSITIVE FOR A 13Q DELETION  #CHF/CAD s/p Defib- on Eliquis  [Dr.Kowalski]; Orthostatic hypotension   -------------------------------------------------------------   DIAGNOSIS: CLL  STAGE:  IV ;GOALS: CONTROL/palliative  CURRENT/MOST RECENT THERAPY;: July 29th, 2019 GAZYVA      CLL (chronic lymphocytic leukemia) ([place])   04/25/2018 -  Chemotherapy    The patient had obinutuzumab (GAZYVA) 100 mg in sodium chloride 0.9 % 100 mL (0.9615 mg/mL) chemo infusion, 100 mg, Intravenous, Once, 2 of 6 cycles Administration: 100 mg (04/26/2018), 900 mg (04/27/2018), 1,000 mg (06/14/2018), 1,000 mg (06/02/2018)  for chemotherapy treatment.      Interval history- Clifford Herrera, 77 year old male, with [name], presents to symptom management clinic after being diagnosed with pneumonia last week.  He was seen for re-evaluation on 06/28/18 and had continued dizziness. Sodium level was 125 despite holding trosemide  Over weekend. Fluid restriction and continuing to hold torsemide was discussed.  He was still  in a fib at that time. His platelets had been uptrending and Eliquis was restarted.  Today, he reports feeling somewhat better. He continues to have dizziness, worse when changing positions. His cough continues to be deep and have white sputum but no fever, chills. His wife reports he is sleeping elevated due to cough which helps improve sleep quality. He continues to have lower extremity edema but has been unable to start compression socks/hose. He's tolerating fluid restriction well. Denies any new bleeding or bruising since restarting eliquis. No additional falls. No black or tar-like stools.   ECOG FS:2 - Symptomatic, <50% confined to bed  Review of systems- Review of Systems  Constitutional: Positive for malaise/fatigue. Negative for chills, fever and weight loss.  HENT: Positive for hearing loss (chronic). Negative for congestion, ear discharge, ear pain, sinus pain, sore throat and tinnitus.   Eyes: Negative.   Respiratory: Positive for cough (improved), sputum production and shortness of breath (improved). Negative for wheezing and stridor.   Cardiovascular: Positive for leg swelling. Negative for chest pain, palpitations, orthopnea and claudication.  Gastrointestinal: Negative for abdominal pain, blood in stool, constipation, diarrhea, heartburn, nausea and vomiting.  Genitourinary: Negative.   Musculoskeletal: Negative.   Skin: Negative.   Neurological: Positive for dizziness and weakness. Negative for tingling and headaches.  Endo/Heme/Allergies: Negative.   Psychiatric/Behavioral: Negative.      Current treatment- Gazyva on 06/14/18  No Known Allergies  Past Medical History:  Diagnosis Date  . CLL (chronic lymphocytic leukemia) ([place])   . CPAP (continuous positive airway pressure) dependence   . Heart failure ([place])   . HOH (hard of hearing)   . Presence of automatic (implantable) cardiac defibrillator   . Sleep  apnea   . Upper respiratory infection    chronic    Past  Surgical History:  Procedure Laterality Date  . CARDIAC DEFIBRILLATOR PLACEMENT  2015    Social History   Socioeconomic History  . Marital status: Married    Spouse name: Not on file  . Number of children: 3  . Years of education: Not on file  . Highest education level: Bachelor's degree (e.g., BA, AB, BS)  Occupational History  . Occupation: Retired  Scientific laboratory technician  . Financial resource strain: Not hard at all  . Food insecurity:    Worry: Never true    Inability: Never true  . Transportation needs:    Medical: No    Non-medical: No  Tobacco Use  . Smoking status: Former Smoker    Packs/day: 2.00    Years: 42.00    Pack years: 84.00    Types: Cigarettes    Last attempt to quit: 1991    Years since quitting: 28.7  . Smokeless tobacco: Current User    Types: Chew  . Tobacco comment: smoking cessation materials not required  Substance and Sexual Activity  . Alcohol use: Yes    Comment: 6 beers per month  . Drug use: No  . Sexual activity: Not Currently    Comment: quit about 20 years ago  Lifestyle  . Physical activity:    Days per week: 0 days    Minutes per session: 0 min  . Stress: Not at all  Relationships  . Social connections:    Talks on phone: Patient refused    Gets together: Patient refused    Attends religious service: Patient refused    Active member of club or organization: Patient refused    Attends meetings of clubs or organizations: Patient refused    Relationship status: Patient refused  . Intimate partner violence:    Fear of current or ex partner: No    Emotionally abused: No    Physically abused: No    Forced sexual activity: No  Other Topics Concern  . Not on file  Social History Narrative  . Not on file    Family History  Problem Relation Age of Onset  . Diabetes Mother   . CAD Mother   . Diabetes Father   . CAD Father     Current Outpatient Medications:  .  acyclovir (ZOVIRAX) 400 MG tablet, One pill a day [to prevent  shingles], Disp: 30 tablet, Rfl: 3 .  allopurinol (ZYLOPRIM) 100 MG tablet, TAKE 1 TABLET BY MOUTH ONCE EVERY EVENING, Disp: , Rfl:  .  apixaban (ELIQUIS) 5 MG TABS tablet, TAKE 1 TABLET EVERY 12 HOURS, Disp: , Rfl:  .  dexamethasone (DECADRON) 4 MG tablet, Take 1 tablet (4 mg total) by mouth daily. Take 1 tablet starting 2 days prior to infusion. Do not take on day of infusion, Disp: 30 tablet, Rfl: 1 .  Dextromethorphan Polistirex (ROBITUSSIN 12 HOUR COUGH PO), Take by mouth., Disp: , Rfl:  .  guaiFENesin (MUCINEX) 600 MG 12 hr tablet, Take by mouth 2 (two) times daily., Disp: , Rfl:  .  levofloxacin (LEVAQUIN) 500 MG tablet, Take 1 tablet (500 mg total) by mouth daily for 10 days., Disp: 10 tablet, Rfl: 0 .  levothyroxine (SYNTHROID, LEVOTHROID) 75 MCG tablet, TAKE 1 TABLET BY MOUTH ONCE DAILY BEFOREBREAKFAST., Disp: 90 tablet, Rfl: 3 .  metoprolol tartrate (LOPRESSOR) 25 MG tablet, Take 37.5 mg by mouth 2 (two) times daily., Disp: , Rfl:  .  montelukast (SINGULAIR) 10 MG tablet, Take 1 tablet (10 mg total) by mouth at bedtime. Take 1 tablet starting 2 days prior to infusion x 4 days., Disp: 30 tablet, Rfl: 2 .  rOPINIRole (REQUIP) 0.25 MG tablet, TAKE 2 TABLETS BY MOUTH AT BEDTIME, Disp: 60 tablet, Rfl: 0 .  spironolactone (ALDACTONE) 25 MG tablet, TAKE 1/2 TABLET BY MOUTH ONCE DAILY, Disp: 45 tablet, Rfl: 3 .  tamsulosin (FLOMAX) 0.4 MG CAPS capsule, Take 1 capsule (0.4 mg total) by mouth daily after supper., Disp: 90 capsule, Rfl: 3 .  torsemide (DEMADEX) 20 MG tablet, Take 10 mg by mouth daily. , Disp: , Rfl:  .  Vitamin D, Cholecalciferol, 1000 units CAPS, Take 2 capsules by mouth daily., Disp: 60 capsule, Rfl:  .  meclizine (ANTIVERT) 25 MG tablet, Take 1 tablet (25 mg total) by mouth 2 (two) times daily as needed for dizziness., Disp: 30 tablet, Rfl: 0  Physical exam:  Vitals:   07/01/18 0948  BP: 119/77  Pulse: 89  Resp: 18  Temp: (!) 95.5 F (35.3 C)  TempSrc: Tympanic  Weight:  222 lb 8 oz (100.9 kg)   Physical Exam  Constitutional: He is oriented to person, place, and time. He appears well-developed and well-nourished. No distress.  accompanied  HENT:  Head: Normocephalic and atraumatic.  Nose: Nose normal.  Mouth/Throat: Oropharynx is clear and moist. No oropharyngeal exudate.  Eyes: Pupils are equal, round, and reactive to light. Conjunctivae and EOM are normal. No scleral icterus.  Neck: Normal range of motion. Neck supple.  Cardiovascular: An irregularly irregular rhythm present.  Pulmonary/Chest: Effort normal. No respiratory distress. He has decreased breath sounds in the left lower field. He has no wheezes.  Coughing less frequently  Abdominal: Soft. Bowel sounds are normal. He exhibits no distension. There is no tenderness.  Musculoskeletal: Normal range of motion. He exhibits edema (BLE edema 1-2+).  Neurological: He is alert and oriented to person, place, and time.  Skin: Skin is warm and dry.  Psychiatric: He has a normal mood and affect. His behavior is normal.     CMP Latest Ref Rng & Units 07/01/2018  Glucose 70 - 99 mg/dL 109(H)  BUN 8 - 23 mg/dL 13  Creatinine 0.61 - 1.24 mg/dL 0.96  Sodium 135 - 145 mmol/L 130(L)  Potassium 3.5 - 5.1 mmol/L 4.5  Chloride 98 - 111 mmol/L 99  CO2 22 - 32 mmol/L 26  Calcium 8.9 - 10.3 mg/dL 7.8(L)  Total Protein 6.5 - 8.1 g/dL 4.9(L)  Total Bilirubin 0.3 - 1.2 mg/dL 0.7  Alkaline Phos 38 - 126 U/L 65  AST 15 - 41 U/L 24  ALT 0 - 44 U/L 21   CBC Latest Ref Rng & Units 07/01/2018  WBC 3.8 - 10.6 K/uL 5.1  Hemoglobin 13.0 - 18.0 g/dL 13.0  Hematocrit 40.0 - 52.0 % 38.3(L)  Platelets 150 - 440 K/uL 110(L)    No images are attached to the encounter.  Dg Chest 2 View  Result Date: 06/24/2018 CLINICAL DATA:  Three days of cough. History of CLL, CHF, coronary artery disease, former smoker. EXAM: CHEST - 2 VIEW COMPARISON:  PA and lateral chest x-ray of August 08, 2017 FINDINGS: The lungs are  well-expanded. There is dense infiltrate posterior medially in the left lower lobe. There is no significant pleural effusion. The heart and pulmonary vascularity are normal. There is calcification in the wall of the aortic arch. The ICD is in stable position. The bony thorax exhibits  no acute abnormality. IMPRESSION: Left lower lobe pneumonia. Followup PA and lateral chest X-ray is recommended in 3-4 weeks following trial of antibiotic therapy to ensure resolution and exclude underlying malignancy. Thoracic aortic atherosclerosis. Electronically Signed   By: David  Martinique M.D.   On: 06/24/2018 12:41    Assessment and plan- Patient is a 77 y.o. male diagnosed with CLL who presents to symptom management clinic for pneumonia & dizziness.   1. 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  12/2017 PET shows massive splenomegaly/mild progression of cervical, axillary, mediastinal/retroperitoneal, and periportal adenopathy.  Currently on Gazyva. Platelets uptrending. Continue allopurinol and acyclovir for prophylaxis.   2.  Left lower lobe pneumonia- per chest x-ray on 06/24/18. Symptomatically improving. WBC 5.1 today, afebrile. Tolerating levaquin well- continue for total of 10 days of treatment. Continue mucinex and robitussin cough for symptom support. Suspect cough will take time to resolve. Breathing exercises recommended. Plan repeat chest x-ray end of October/first of November to ensure resolution of pneumonia.   3. Hyponatremia- Na 130 today- improved. Torsemide previously held. Ok to restart. Continue fluid restriction of 1500cc/day. Will add antivert for dizziness and encouraged compression socks/hose; recommended he change positions slowly and use cane/walker for ambulation.    4. Paroxysmal atrial fib- still in a fib today which may be contributing to dizziness. No palpitations or chest pain. TSH today 0.958 (previously 6.250)- on levothyroxine. Eliquis  restarted on 06/24/18 given rising platelets. Plts 110 today. On metoprolol 25 mg BID for rate control. HR 89 today- improved. Patient has not increased PM dose of metoprolol and will call clinic with current home dose. Continue Eliquis. Follow up with Dr. Nehemiah Massed as scheduled.   5. CHF- w/ cardiomyopathy; Managed by Dr. Nehemiah Massed. weight today 222 lb (increased), persistent BLE edema.BNP previously 987 (improved). No crackles or evidence of fluid overload today but weight uptrending.Restart torsemide and Continue spironolactone. Discussed leg elevation and compression stockings.   6. Hematuria- Hematuria on recent UA and family report of 'pink dribble'; no clots. No flank pain. Negative for infection. Hx of CKD stage 3. GFR today > 60, Cr 0.96, BUN 13. Improved. Follow up with Dr. Army Melia as scheduled.   Follow up with Dr. [name] as scheduled. Patient advised to notify the clinic if there is no improvement in symptoms or if symptoms worsen in the interim.   Visit Diagnosis 1. CLL (chronic lymphocytic leukemia) ([place])   2. Pneumonia of left lower lobe due to infectious organism ([place])   3. Paroxysmal A-fib ([place])     Patient expressed understanding and was in agreement with this plan. He also understands that He can call clinic at any time with any questions, concerns, or complaints.   Thank you for allowing me to participate in the care of this very pleasant patient.   Beckey Rutter, DNP, AGNP-C [place] [facility] [place] (work cell) [phone] (office)  CC: Dr. Nehemiah Massed Dr. [name] Dr. Army Melia

## 2018-07-01 ENCOUNTER — Inpatient Hospital Stay (HOSPITAL_BASED_OUTPATIENT_CLINIC_OR_DEPARTMENT_OTHER): Payer: Medicare Other | Admitting: Nurse Practitioner

## 2018-07-01 ENCOUNTER — Inpatient Hospital Stay: Payer: Medicare Other | Attending: Nurse Practitioner

## 2018-07-01 ENCOUNTER — Encounter: Payer: Self-pay | Admitting: Nurse Practitioner

## 2018-07-01 VITALS — BP 119/77 | HR 89 | Temp 95.5°F | Resp 18 | Wt 222.5 lb

## 2018-07-01 DIAGNOSIS — J181 Lobar pneumonia, unspecified organism: Secondary | ICD-10-CM | POA: Insufficient documentation

## 2018-07-01 DIAGNOSIS — I509 Heart failure, unspecified: Secondary | ICD-10-CM | POA: Diagnosis not present

## 2018-07-01 DIAGNOSIS — I480 Paroxysmal atrial fibrillation: Secondary | ICD-10-CM | POA: Insufficient documentation

## 2018-07-01 DIAGNOSIS — R600 Localized edema: Secondary | ICD-10-CM

## 2018-07-01 DIAGNOSIS — R319 Hematuria, unspecified: Secondary | ICD-10-CM

## 2018-07-01 DIAGNOSIS — Z87891 Personal history of nicotine dependence: Secondary | ICD-10-CM

## 2018-07-01 DIAGNOSIS — N183 Chronic kidney disease, stage 3 (moderate): Secondary | ICD-10-CM

## 2018-07-01 DIAGNOSIS — Z79899 Other long term (current) drug therapy: Secondary | ICD-10-CM | POA: Insufficient documentation

## 2018-07-01 DIAGNOSIS — C8303 Small cell B-cell lymphoma, intra-abdominal lymph nodes: Secondary | ICD-10-CM

## 2018-07-01 DIAGNOSIS — E871 Hypo-osmolality and hyponatremia: Secondary | ICD-10-CM

## 2018-07-01 DIAGNOSIS — C9110 Chronic lymphocytic leukemia of B-cell type not having achieved remission: Secondary | ICD-10-CM

## 2018-07-01 DIAGNOSIS — J189 Pneumonia, unspecified organism: Secondary | ICD-10-CM

## 2018-07-01 DIAGNOSIS — I429 Cardiomyopathy, unspecified: Secondary | ICD-10-CM

## 2018-07-01 LAB — CBC WITH DIFFERENTIAL/PLATELET
Basophils Absolute: 0 10*3/uL (ref 0–0.1)
Basophils Relative: 0 %
EOS PCT: 1 %
Eosinophils Absolute: 0 10*3/uL (ref 0–0.7)
HEMATOCRIT: 38.3 % — AB (ref 40.0–52.0)
Hemoglobin: 13 g/dL (ref 13.0–18.0)
LYMPHS ABS: 0.5 10*3/uL — AB (ref 1.0–3.6)
LYMPHS PCT: 9 %
MCH: 28.2 pg (ref 26.0–34.0)
MCHC: 33.9 g/dL (ref 32.0–36.0)
MCV: 83.2 fL (ref 80.0–100.0)
MONO ABS: 0.3 10*3/uL (ref 0.2–1.0)
MONOS PCT: 5 %
NEUTROS ABS: 4.4 10*3/uL (ref 1.4–6.5)
Neutrophils Relative %: 85 %
PLATELETS: 110 10*3/uL — AB (ref 150–440)
RBC: 4.61 MIL/uL (ref 4.40–5.90)
RDW: 15.3 % — AB (ref 11.5–14.5)
WBC: 5.1 10*3/uL (ref 3.8–10.6)

## 2018-07-01 LAB — COMPREHENSIVE METABOLIC PANEL
ALT: 21 U/L (ref 0–44)
AST: 24 U/L (ref 15–41)
Albumin: 2.9 g/dL — ABNORMAL LOW (ref 3.5–5.0)
Alkaline Phosphatase: 65 U/L (ref 38–126)
Anion gap: 5 (ref 5–15)
BILIRUBIN TOTAL: 0.7 mg/dL (ref 0.3–1.2)
BUN: 13 mg/dL (ref 8–23)
CHLORIDE: 99 mmol/L (ref 98–111)
CO2: 26 mmol/L (ref 22–32)
Calcium: 7.8 mg/dL — ABNORMAL LOW (ref 8.9–10.3)
Creatinine, Ser: 0.96 mg/dL (ref 0.61–1.24)
Glucose, Bld: 109 mg/dL — ABNORMAL HIGH (ref 70–99)
POTASSIUM: 4.5 mmol/L (ref 3.5–5.1)
Sodium: 130 mmol/L — ABNORMAL LOW (ref 135–145)
Total Protein: 4.9 g/dL — ABNORMAL LOW (ref 6.5–8.1)

## 2018-07-01 MED ORDER — MECLIZINE HCL 25 MG PO TABS: 25.0000 mg | ORAL_TABLET | Freq: Two times a day (BID) | ORAL | 0 refills | Status: DC | PRN

## 2018-07-09 ENCOUNTER — Ambulatory Visit
Admission: RE | Admit: 2018-07-09 | Discharge: 2018-07-09 | Disposition: A | Discharge status: Home / Self Care | Payer: Medicare Other | Source: Ambulatory Visit | Attending: Internal Medicine | Admitting: Internal Medicine

## 2018-07-09 ENCOUNTER — Telehealth: Payer: Self-pay | Admitting: *Deleted

## 2018-07-09 DIAGNOSIS — J181 Lobar pneumonia, unspecified organism: Principal | ICD-10-CM

## 2018-07-09 DIAGNOSIS — J189 Pneumonia, unspecified organism: Secondary | ICD-10-CM

## 2018-07-09 NOTE — Telephone Encounter (Signed)
Patient's wife called triage to give an update on Clifford Herrera and to see whether or not he should take his pre-meds this weekend for treatment on Monday. She said he is still battling with the pneumonia, and although he is some better, he is still coughing a lot. He finished his antibiotics but he is still not well. She wonders if he will get his treatment on Monday; if not, she doesn't want to give him the dexamethasone and the singulair over the weekend. Also, he is still having some swelling in his feet/legs. Please advise.    dhs

## 2018-07-09 NOTE — Telephone Encounter (Signed)
Dr. B stated that he would cnl the chemo on Monday; However, Dr. B would still want to see him and have labs as scheduled.  He will need a chest xray prior to this apt. I spoke with patient's wife, who verbalized understanding of the plan of care. Patient will go to the [facility] clinic for the cxr.

## 2018-07-12 ENCOUNTER — Encounter: Payer: Self-pay | Admitting: Internal Medicine

## 2018-07-12 ENCOUNTER — Inpatient Hospital Stay: Payer: Medicare Other

## 2018-07-12 ENCOUNTER — Other Ambulatory Visit: Payer: Self-pay

## 2018-07-12 ENCOUNTER — Inpatient Hospital Stay (HOSPITAL_BASED_OUTPATIENT_CLINIC_OR_DEPARTMENT_OTHER): Payer: Medicare Other | Admitting: Internal Medicine

## 2018-07-12 VITALS — BP 109/71 | HR 88 | Temp 97.6°F | Resp 20 | Ht 73.0 in | Wt 224.0 lb

## 2018-07-12 DIAGNOSIS — I509 Heart failure, unspecified: Secondary | ICD-10-CM

## 2018-07-12 DIAGNOSIS — M7989 Other specified soft tissue disorders: Secondary | ICD-10-CM

## 2018-07-12 DIAGNOSIS — C9110 Chronic lymphocytic leukemia of B-cell type not having achieved remission: Secondary | ICD-10-CM | POA: Diagnosis not present

## 2018-07-12 DIAGNOSIS — R161 Splenomegaly, not elsewhere classified: Secondary | ICD-10-CM | POA: Diagnosis not present

## 2018-07-12 DIAGNOSIS — Z87891 Personal history of nicotine dependence: Secondary | ICD-10-CM

## 2018-07-12 DIAGNOSIS — I4891 Unspecified atrial fibrillation: Secondary | ICD-10-CM

## 2018-07-12 LAB — COMPREHENSIVE METABOLIC PANEL
ALBUMIN: 3.4 g/dL — AB (ref 3.5–5.0)
ALK PHOS: 58 U/L (ref 38–126)
ALT: 17 U/L (ref 0–44)
AST: 19 U/L (ref 15–41)
Anion gap: 6 (ref 5–15)
BUN: 13 mg/dL (ref 8–23)
CHLORIDE: 99 mmol/L (ref 98–111)
CO2: 28 mmol/L (ref 22–32)
Calcium: 8 mg/dL — ABNORMAL LOW (ref 8.9–10.3)
Creatinine, Ser: 0.93 mg/dL (ref 0.61–1.24)
GFR calc Af Amer: >60 mL/min (ref >60)
GFR calc non Af Amer: >60 mL/min (ref >60)
GLUCOSE: 97 mg/dL (ref 70–99)
POTASSIUM: 4.7 mmol/L (ref 3.5–5.1)
Sodium: 133 mmol/L — ABNORMAL LOW (ref 135–145)
Total Bilirubin: 0.8 mg/dL (ref 0.3–1.2)
Total Protein: 5 g/dL — ABNORMAL LOW (ref 6.5–8.1)

## 2018-07-12 LAB — CBC WITH DIFFERENTIAL/PLATELET
ABS IMMATURE GRANULOCYTES: 0.03 10*3/uL (ref 0.00–0.07)
Basophils Absolute: 0 10*3/uL (ref 0.0–0.1)
Basophils Relative: 0 %
Eosinophils Absolute: 0.1 10*3/uL (ref 0.0–0.5)
Eosinophils Relative: 2 %
HCT: 40.6 % (ref 39.0–52.0)
HEMOGLOBIN: 13.2 g/dL (ref 13.0–17.0)
Immature Granulocytes: 1 %
LYMPHS ABS: 0.9 10*3/uL (ref 0.7–4.0)
LYMPHS PCT: 18 %
MCH: 27.7 pg (ref 26.0–34.0)
MCHC: 32.5 g/dL (ref 30.0–36.0)
MCV: 85.1 fL (ref 80.0–100.0)
MONOS PCT: 6 %
Monocytes Absolute: 0.3 10*3/uL (ref 0.1–1.0)
Neutro Abs: 3.4 10*3/uL (ref 1.7–7.7)
Neutrophils Relative %: 73 %
Platelets: 58 10*3/uL — ABNORMAL LOW (ref 150–400)
RBC: 4.77 MIL/uL (ref 4.22–5.81)
RDW: 15.2 % (ref 11.5–15.5)
WBC: 4.7 10*3/uL (ref 4.0–10.5)
nRBC: 0 % (ref 0.0–0.2)

## 2018-07-12 MED ORDER — TIOTROPIUM BROMIDE MONOHYDRATE 18 MCG IN CAPS: 18.0000 ug | ORAL_CAPSULE | Freq: Every day | RESPIRATORY_TRACT | 0 refills | Status: DC

## 2018-07-12 MED ORDER — ALBUTEROL SULFATE HFA 108 (90 BASE) MCG/ACT IN AERS: 2.0000 | INHALATION_SPRAY | Freq: Four times a day (QID) | RESPIRATORY_TRACT | 2 refills | Status: AC | PRN

## 2018-07-12 NOTE — Progress Notes (Signed)
[place] OFFICE PROGRESS NOTE  Patient Care Team: [name], MD as PCP - General (Internal Medicine) Corey Skains, MD as Consulting Physician (Cardiology) Cammie Sickle, MD as Medical Oncologist (Hematology and Oncology)  Cancer Staging No matching staging information was found for the patient.   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 Surveillance; [facility]- UNMUTATED [dec 2017]  # AUG 2018- FISH panel analysis was positive for loss of one 13q14 signal. NEGATIVE for CCND1/IGH, ATM, chromosome 12, and TP53 were normal. MAY 2019- : 85% OF NUCLEI POSITIVE FOR A 13Q DELETION  #CHF/CAD s/p Defib- on Eliquis  [Dr.Kowalski]; Orthostatic hypotension   -------------------------------------------------------------   DIAGNOSIS: CLL  STAGE:  IV ;GOALS: CONTROL/palliative  CURRENT/MOST RECENT THERAPY;: July 29th, 2019 GAZYVA      CLL (chronic lymphocytic leukemia) ([place])   04/25/2018 -  Chemotherapy    The patient had obinutuzumab (GAZYVA) 100 mg in sodium chloride 0.9 % 100 mL (0.9615 mg/mL) chemo infusion, 100 mg, Intravenous, Once, 2 of 6 cycles Administration: 100 mg (04/26/2018), 900 mg (04/27/2018), 1,000 mg (06/14/2018), 1,000 mg (06/02/2018)  for chemotherapy treatment.        INTERVAL HISTORY:  Clifford Herrera 77 y.o.  male pleasant patient [name] currently on [name] is here for follow-up.  Patient currently status post cycle #2.   Treatment is currently on hold because of recent pneumonia.  Patient is currently status post Levaquin.   Patient denies any fevers or chills.  However complains of continued cough generalized weakness.  He has noted increasing swelling in the legs.  Patient was taken off his Demadex recently given the pneumonia/orthostatic hypotension.  Review of Systems   Constitutional: Positive for malaise/fatigue. Negative for chills, diaphoresis, fever and weight loss.  HENT: Negative for nosebleeds and sore throat.   Eyes: Negative for double vision.  Respiratory: Positive for cough and shortness of breath. Negative for hemoptysis, sputum production and wheezing.   Cardiovascular: Negative for chest pain, palpitations, orthopnea and leg swelling.  Gastrointestinal: Negative for abdominal pain, blood in stool, constipation, diarrhea, heartburn, melena, nausea and vomiting.  Genitourinary: Negative for dysuria, frequency and urgency.  Musculoskeletal: Negative for joint pain.  Skin: Negative.  Negative for itching and rash.  Neurological: Negative for dizziness, tingling, focal weakness, weakness and headaches.  Endo/Heme/Allergies: Bruises/bleeds easily (improving).  Psychiatric/Behavioral: Negative for depression. The patient is not nervous/anxious and does not have insomnia.       PAST MEDICAL HISTORY :  Past Medical History:  Diagnosis Date  . CLL (chronic lymphocytic leukemia) ([place])   . CPAP (continuous positive airway pressure) dependence   . Heart failure ([place])   . HOH (hard of hearing)   . Presence of automatic (implantable) cardiac defibrillator   . Sleep apnea   . Upper respiratory infection    chronic    PAST SURGICAL HISTORY :   Past Surgical History:  Procedure Laterality Date  . CARDIAC DEFIBRILLATOR PLACEMENT  2015    FAMILY HISTORY :   Family History  Problem Relation Age of Onset  . Diabetes Mother   . CAD Mother   . Diabetes Father   . CAD Father     SOCIAL HISTORY:   Social History   Tobacco Use  . Smoking status: Former Smoker    Packs/day: 2.00    Years: 42.00    Pack  years: 84.00    Types: Cigarettes    Last attempt to quit: 1991    Years since quitting: 28.8  . Smokeless tobacco: Current User    Types: Chew  . Tobacco comment: smoking cessation materials not required  Substance Use Topics  . Alcohol  use: Yes    Comment: 6 beers per month  . Drug use: No    ALLERGIES:  has No Known Allergies.  MEDICATIONS:  Current Outpatient Medications  Medication Sig Dispense Refill  . acyclovir (ZOVIRAX) 400 MG tablet One pill a day [to prevent shingles] 30 tablet 3  . allopurinol (ZYLOPRIM) 100 MG tablet TAKE 1 TABLET BY MOUTH ONCE EVERY EVENING    . apixaban (ELIQUIS) 5 MG TABS tablet TAKE 1 TABLET EVERY 12 HOURS    . Dextromethorphan Polistirex (ROBITUSSIN 12 HOUR COUGH PO) Take by mouth.    Marland Kitchen guaiFENesin (MUCINEX) 600 MG 12 hr tablet Take by mouth 2 (two) times daily.    Marland Kitchen levothyroxine (SYNTHROID, LEVOTHROID) 75 MCG tablet TAKE 1 TABLET BY MOUTH ONCE DAILY BEFOREBREAKFAST. 90 tablet 3  . meclizine (ANTIVERT) 25 MG tablet Take 1 tablet (25 mg total) by mouth 2 (two) times daily as needed for dizziness. 30 tablet 0  . metoprolol tartrate (LOPRESSOR) 25 MG tablet Take 37.5 mg by mouth 2 (two) times daily.    . montelukast (SINGULAIR) 10 MG tablet Take 1 tablet (10 mg total) by mouth at bedtime. Take 1 tablet starting 2 days prior to infusion x 4 days. 30 tablet 2  . rOPINIRole (REQUIP) 0.25 MG tablet TAKE 2 TABLETS BY MOUTH AT BEDTIME 60 tablet 0  . spironolactone (ALDACTONE) 25 MG tablet TAKE 1/2 TABLET BY MOUTH ONCE DAILY 45 tablet 3  . tamsulosin (FLOMAX) 0.4 MG CAPS capsule Take 1 capsule (0.4 mg total) by mouth daily after supper. 90 capsule 3  . Vitamin D, Cholecalciferol, 1000 units CAPS Take 2 capsules by mouth daily. 60 capsule   . albuterol (PROVENTIL HFA;VENTOLIN HFA) 108 (90 Base) MCG/ACT inhaler Inhale 2 puffs into the lungs every 6 (six) hours as needed for wheezing or shortness of breath. 1 Inhaler 2  . dexamethasone (DECADRON) 4 MG tablet Take 1 tablet (4 mg total) by mouth daily. Take 1 tablet starting 2 days prior to infusion. Do not take on day of infusion (Patient not taking: Reported on 07/12/2018) 30 tablet 1  . tiotropium (SPIRIVA HANDIHALER) 18 MCG inhalation capsule Place 1  capsule (18 mcg total) into inhaler and inhale daily for 30 doses. 30 capsule 0  . torsemide (DEMADEX) 20 MG tablet Take 10 mg by mouth daily.      No current facility-administered medications for this visit.     PHYSICAL EXAMINATION: ECOG PERFORMANCE STATUS: 1 - Symptomatic but completely ambulatory  BP 109/71 (Patient Position: Sitting)   Pulse 88   Temp 97.6 F (36.4 C) (Tympanic)   Resp 20   Ht '6\' 1"'  (1.854 m)   Wt 224 lb (101.6 kg)   BMI 29.55 kg/m   Filed Weights   07/12/18 0827  Weight: 224 lb (101.6 kg)    Physical Exam  Constitutional: He is oriented to person, place, and time and well-developed, well-nourished, and in no distress.  Accompanied by his wife.  Is walking himself.  HENT:  Head: Normocephalic and atraumatic.  Mouth/Throat: Oropharynx is clear and moist. No oropharyngeal exudate.  Eyes: Pupils are equal, round, and reactive to light.  Neck: Normal range of motion. Neck supple.  Cardiovascular: Normal  rate and regular rhythm.  Pulmonary/Chest: No respiratory distress. He has no wheezes.  Decreased air entry left more than right at the bases.  Abdominal: Soft. Bowel sounds are normal. He exhibits no distension and no mass. There is no tenderness. There is no rebound and no guarding.  Positive for splenomegaly.  Musculoskeletal: Normal range of motion. He exhibits no edema or tenderness.  Neurological: He is alert and oriented to person, place, and time.  Skin: Skin is warm.  Multiple bruises chronic-upper lower extremities.  Psychiatric: Affect normal.       LABORATORY DATA:  I have reviewed the data as listed    Component Value Date/Time   NA 133 (L) 07/12/2018 0810   NA 137 12/04/2017 1050   NA 136 07/25/2014 0414   K 4.7 07/12/2018 0810   K 5.6 (H) 07/25/2014 0414   CL 99 07/12/2018 0810   CL 101 07/25/2014 0414   CO2 28 07/12/2018 0810   CO2 24 07/25/2014 0414   GLUCOSE 97 07/12/2018 0810   GLUCOSE 143 (H) 07/25/2014 0414   BUN 13  07/12/2018 0810   BUN 15 12/04/2017 1050   BUN 33 (H) 07/25/2014 0414   CREATININE 0.93 07/12/2018 0810   CREATININE 1.34 (H) 01/15/2015 0956   CALCIUM 8.0 (L) 07/12/2018 0810   CALCIUM 8.3 (L) 07/25/2014 0414   PROT 5.0 (L) 07/12/2018 0810   PROT 5.8 (L) 08/17/2017 1551   PROT 6.4 07/24/2014 0937   ALBUMIN 3.4 (L) 07/12/2018 0810   ALBUMIN 4.3 08/17/2017 1551   ALBUMIN 4.0 07/24/2014 0937   AST 19 07/12/2018 0810   AST 31 07/24/2014 0937   ALT 17 07/12/2018 0810   ALT 23 07/24/2014 0937   ALKPHOS 58 07/12/2018 0810   ALKPHOS 53 07/24/2014 0937   BILITOT 0.8 07/12/2018 0810   BILITOT 0.4 08/17/2017 1551   BILITOT 1.1 (H) 07/24/2014 0937   GFRNONAA >60 07/12/2018 0810   GFRNONAA 52 (L) 01/15/2015 0956   GFRAA >60 07/12/2018 0810   GFRAA >60 01/15/2015 0956    No results found for: SPEP, UPEP  Lab Results  Component Value Date   WBC 4.7 07/12/2018   NEUTROABS 3.4 07/12/2018   HGB 13.2 07/12/2018   HCT 40.6 07/12/2018   MCV 85.1 07/12/2018   PLT 58 (L) 07/12/2018      Chemistry      Component Value Date/Time   NA 133 (L) 07/12/2018 0810   NA 137 12/04/2017 1050   NA 136 07/25/2014 0414   K 4.7 07/12/2018 0810   K 5.6 (H) 07/25/2014 0414   CL 99 07/12/2018 0810   CL 101 07/25/2014 0414   CO2 28 07/12/2018 0810   CO2 24 07/25/2014 0414   BUN 13 07/12/2018 0810   BUN 15 12/04/2017 1050   BUN 33 (H) 07/25/2014 0414   CREATININE 0.93 07/12/2018 0810   CREATININE 1.34 (H) 01/15/2015 0956      Component Value Date/Time   CALCIUM 8.0 (L) 07/12/2018 0810   CALCIUM 8.3 (L) 07/25/2014 0414   ALKPHOS 58 07/12/2018 0810   ALKPHOS 53 07/24/2014 0937   AST 19 07/12/2018 0810   AST 31 07/24/2014 0937   ALT 17 07/12/2018 0810   ALT 23 07/24/2014 0937   BILITOT 0.8 07/12/2018 0810   BILITOT 0.4 08/17/2017 1551   BILITOT 1.1 (H) 07/24/2014 7341       RADIOGRAPHIC STUDIES: I have personally reviewed the radiological images as listed and agreed with the findings in  the  report. No results found.   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  Stable.  # s/p Gazyva cycle #2; HOLD treatment today; see below.   # recent pneumonia/ persistent LLL infiltrate- on CXR 10/12; do not suspect clinical active pneumonia; hold further antibiotics at this time. start Spiriva/ albuterol. Will order CT chest.  # History of A. fib on Eliquis [platelets 50s]; easy bruising/bleeding; off eliquis.   #CHF-given the worsening swelling in the legs recommend restarting Demadex as ordered by cardiology.  # continue Infectious prophylaxis: Acyclovir ; continue allopurinol.   # CT scan ASAP; follow up TBD.    Orders Placed This Encounter  Procedures  . CT CHEST WO CONTRAST    Standing Status:   Future    Standing Expiration Date:   07/13/2019    Order Specific Question:   Preferred imaging location?    Answer:   [facility]    Order Specific Question:   Radiology Contrast Protocol - do NOT remove file path    Answer:   [email or web address]    Order Specific Question:   ** REASON FOR EXAM (FREE TEXT)    Answer:   CLL; LLL pneumonia   All questions were answered. The patient knows to call the clinic with any problems, questions or concerns.      Cammie Sickle, MD 07/12/2018 8:12 PM

## 2018-07-12 NOTE — Assessment & Plan Note (Addendum)
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  Stable.  # s/p Gazyva cycle #2; HOLD treatment today; see below.   # recent pneumonia/ persistent LLL infiltrate- on CXR 10/12; do not suspect clinical active pneumonia; hold further antibiotics at this time. start Spiriva/ albuterol. Will order CT chest.  # History of A. fib on Eliquis [platelets 50s]; easy bruising/bleeding; off eliquis.   #CHF-given the worsening swelling in the legs recommend restarting Demadex as ordered by cardiology.  # continue Infectious prophylaxis: Acyclovir ; continue allopurinol.   # CT scan ASAP; follow up TBD.

## 2018-07-14 ENCOUNTER — Other Ambulatory Visit: Payer: Self-pay | Admitting: Internal Medicine

## 2018-07-15 ENCOUNTER — Ambulatory Visit
Admission: RE | Admit: 2018-07-15 | Discharge: 2018-07-15 | Disposition: A | Discharge status: Home / Self Care | Payer: Medicare Other | Source: Ambulatory Visit | Attending: Internal Medicine | Admitting: Internal Medicine

## 2018-07-15 ENCOUNTER — Other Ambulatory Visit: Payer: Self-pay

## 2018-07-15 DIAGNOSIS — R918 Other nonspecific abnormal finding of lung field: Secondary | ICD-10-CM | POA: Diagnosis not present

## 2018-07-15 DIAGNOSIS — J439 Emphysema, unspecified: Secondary | ICD-10-CM | POA: Insufficient documentation

## 2018-07-15 DIAGNOSIS — I2510 Atherosclerotic heart disease of native coronary artery without angina pectoris: Secondary | ICD-10-CM | POA: Insufficient documentation

## 2018-07-15 DIAGNOSIS — I700 Atherosclerosis of aorta: Secondary | ICD-10-CM | POA: Diagnosis not present

## 2018-07-15 DIAGNOSIS — R590 Localized enlarged lymph nodes: Secondary | ICD-10-CM | POA: Diagnosis not present

## 2018-07-15 DIAGNOSIS — C9110 Chronic lymphocytic leukemia of B-cell type not having achieved remission: Secondary | ICD-10-CM | POA: Diagnosis not present

## 2018-07-15 MED ORDER — ALLOPURINOL 100 MG PO TABS: ORAL_TABLET | ORAL | 5 refills | Status: AC

## 2018-07-16 ENCOUNTER — Telehealth: Payer: Self-pay | Admitting: *Deleted

## 2018-07-16 ENCOUNTER — Ambulatory Visit: Payer: Medicare Other

## 2018-07-16 ENCOUNTER — Telehealth: Payer: Self-pay | Admitting: Internal Medicine

## 2018-07-16 NOTE — Telephone Encounter (Signed)
Patient calling for CT Results He has no FOLLOW UP appt   IMPRESSION: 1. Persistent bilateral lower lobe opacities, left greater than right. Findings are favored to represent either multifocal pneumonia and/or aspiration. Follow-up imaging is recommended to ensure resolution. If these do not resolve then further investigation with bronchoscopy may be considered in this patient who is at increased risk for lung cancer. 2. Stable appearance of mediastinal and axillary adenopathy compatible with the clinical history of CLL. 3. Aortic Atherosclerosis (ICD10-I70.0) and Emphysema (ICD10-J43.9). 4. Coronary artery atherosclerotic calcifications.   Electronically Signed   By: Kerby Moors M.D.   On: 07/15/2018 13:08

## 2018-07-16 NOTE — Telephone Encounter (Signed)
Spoke to pt's re: non-resolution of "pneumonia" as seen on CT scan; recommend checking O2 sats.   Brooke- Please make a referral to [facility] asap/ Dx; Pneumonia  collete- Have pt follow up with me in 10 days; cbc/bmp- Thx

## 2018-07-17 ENCOUNTER — Other Ambulatory Visit: Payer: Self-pay | Admitting: Internal Medicine

## 2018-07-19 ENCOUNTER — Other Ambulatory Visit: Payer: Self-pay | Admitting: Internal Medicine

## 2018-07-19 DIAGNOSIS — J189 Pneumonia, unspecified organism: Secondary | ICD-10-CM

## 2018-07-19 DIAGNOSIS — J181 Lobar pneumonia, unspecified organism: Principal | ICD-10-CM

## 2018-07-19 NOTE — Telephone Encounter (Signed)
Referral to [facility] has been placed.

## 2018-07-20 ENCOUNTER — Other Ambulatory Visit: Payer: Self-pay | Admitting: Internal Medicine

## 2018-07-20 ENCOUNTER — Telehealth: Payer: Self-pay | Admitting: Internal Medicine

## 2018-07-20 DIAGNOSIS — J189 Pneumonia, unspecified organism: Secondary | ICD-10-CM

## 2018-07-20 DIAGNOSIS — J181 Lobar pneumonia, unspecified organism: Principal | ICD-10-CM

## 2018-07-20 NOTE — Telephone Encounter (Signed)
Referral to Dr. Mortimer Fries has been placed

## 2018-07-20 NOTE — Telephone Encounter (Signed)
Thanks, Shanon Brow for talking to me about this pt; I will make referral to you. Appreciate it.    #I Spoke to patient's wife Rise Paganini made aware of the plan; plan to hold off further therapy at this time.  Recommend keeping appointment with me as planned on the 31st; if CBC improving would recommend starting Eliquis.   Collete-please make a referral to Dr. Myrtice Lauth asap Dx; pneumonia.

## 2018-07-21 ENCOUNTER — Encounter: Payer: Self-pay | Admitting: Internal Medicine

## 2018-07-21 DIAGNOSIS — I700 Atherosclerosis of aorta: Secondary | ICD-10-CM | POA: Insufficient documentation

## 2018-07-22 ENCOUNTER — Ambulatory Visit (INDEPENDENT_AMBULATORY_CARE_PROVIDER_SITE_OTHER): Payer: Medicare Other | Admitting: Internal Medicine

## 2018-07-22 ENCOUNTER — Encounter: Payer: Self-pay | Admitting: Internal Medicine

## 2018-07-22 ENCOUNTER — Institutional Professional Consult (permissible substitution): Payer: Medicare Other | Admitting: Pulmonary Disease

## 2018-07-22 VITALS — BP 102/60 | HR 82 | Ht 73.0 in | Wt 217.0 lb

## 2018-07-22 DIAGNOSIS — C9110 Chronic lymphocytic leukemia of B-cell type not having achieved remission: Secondary | ICD-10-CM

## 2018-07-22 DIAGNOSIS — Z0000 Encounter for general adult medical examination without abnormal findings: Secondary | ICD-10-CM

## 2018-07-22 DIAGNOSIS — Z9989 Dependence on other enabling machines and devices: Secondary | ICD-10-CM

## 2018-07-22 DIAGNOSIS — E039 Hypothyroidism, unspecified: Secondary | ICD-10-CM

## 2018-07-22 DIAGNOSIS — N401 Enlarged prostate with lower urinary tract symptoms: Secondary | ICD-10-CM | POA: Diagnosis not present

## 2018-07-22 DIAGNOSIS — I700 Atherosclerosis of aorta: Secondary | ICD-10-CM

## 2018-07-22 DIAGNOSIS — I480 Paroxysmal atrial fibrillation: Secondary | ICD-10-CM

## 2018-07-22 DIAGNOSIS — M109 Gout, unspecified: Secondary | ICD-10-CM

## 2018-07-22 DIAGNOSIS — I2510 Atherosclerotic heart disease of native coronary artery without angina pectoris: Secondary | ICD-10-CM | POA: Diagnosis not present

## 2018-07-22 DIAGNOSIS — G4733 Obstructive sleep apnea (adult) (pediatric): Secondary | ICD-10-CM | POA: Diagnosis not present

## 2018-07-22 DIAGNOSIS — R351 Nocturia: Secondary | ICD-10-CM

## 2018-07-22 LAB — POCT URINALYSIS DIPSTICK
Bilirubin, UA: NEGATIVE
GLUCOSE UA: NEGATIVE
KETONES UA: NEGATIVE
Leukocytes, UA: NEGATIVE
NITRITE UA: NEGATIVE
PROTEIN UA: NEGATIVE
RBC UA: NEGATIVE
SPEC GRAV UA: 1.01 (ref 1.010–1.025)
Urobilinogen, UA: 0.2 E.U./dL
pH, UA: 6 (ref 5.0–8.0)

## 2018-07-22 NOTE — Progress Notes (Signed)
Date:  07/22/2018   Name:  Clifford Herrera   DOB:  13-Sep-1941   MRN:  [id number]   Chief Complaint: Annual Exam Clifford Herrera is a 77 y.o. male who presents today for his Complete Annual Exam. He feels poorly. He reports exercising none. He reports he is sleeping fairly well. He has completed his pneumonia vaccine series.   He recently had a CT of the chest that showed persistent lower lobe opacities suggestive of infection.  He has been referred to Pulmonologist.   Thyroid Problem  Presents for follow-up visit. Symptoms include fatigue. Patient reports no palpitations or tremors. The symptoms have been stable.  OSA - using CPAP nightly - has been on therapy for at least 5 years.  He generally rests well.  CAD - he denies chest pain or angina.  He has paroxismal Atrial fibrillation. He can not tell if he is in Afib or not.  His stamina and shortness of breath do not change. Apparently cardiology advised to remain off of Eliquis in September.  He is not on a statin.  CLL/lymphoma - followed by Oncology and is stable. Weight is stable, no sweats.   Gout - no recent episodes.  Take allopurinol daily.  Lab Results  Component Value Date   TSH 0.958 06/28/2018   Lab Results  Component Value Date   WBC 4.7 07/12/2018   HGB 13.2 07/12/2018   HCT 40.6 07/12/2018   MCV 85.1 07/12/2018   PLT 58 (L) 07/12/2018   Lab Results  Component Value Date   CREATININE 0.93 07/12/2018   BUN 13 07/12/2018   NA 133 (L) 07/12/2018   K 4.7 07/12/2018   CL 99 07/12/2018   CO2 28 07/12/2018   Lab Results  Component Value Date   ALT 17 07/12/2018   AST 19 07/12/2018   ALKPHOS 58 07/12/2018   BILITOT 0.8 07/12/2018    Review of Systems  Constitutional: Positive for fatigue. Negative for chills, fever and unexpected weight change.  HENT: Negative for trouble swallowing.   Eyes: Negative for visual disturbance.  Respiratory: Positive for cough, chest tightness and shortness of breath.  Negative for wheezing.   Cardiovascular: Positive for leg swelling. Negative for chest pain and palpitations.  Genitourinary: Positive for urgency. Negative for difficulty urinating and hematuria.  Musculoskeletal: Negative for arthralgias, back pain and gait problem.  Skin: Negative for color change and rash.  Neurological: Negative for dizziness, tremors, light-headedness and headaches.  Hematological: Negative for adenopathy. Does not bruise/bleed easily.  Psychiatric/Behavioral: Positive for sleep disturbance. Negative for decreased concentration and dysphoric mood.    Patient Active Problem List   Diagnosis Date Noted  . Atherosclerosis of aorta ([place]) 07/21/2018  . Pneumonia of left lower lobe due to infectious organism ([place]) 06/29/2018  . Hyponatremia 06/29/2018  . Goals of care, counseling/discussion 04/11/2018  . Automatic implantable cardioverter-defibrillator in situ 11/16/2017  . Vitamin D deficiency 09/16/2017  . Hypothyroidism 09/16/2017  . BPH associated with nocturia 08/20/2017  . Allergic rhinitis 08/17/2017  . Gait instability 08/17/2017  . Hypocalcemia 08/17/2017  . CKD (chronic kidney disease) stage 3, GFR 30-59 ml/min (HCC) 08/17/2017  . Gout 08/17/2017  . RLS (restless legs syndrome) 10/23/2016  . 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 Moderate mitral insufficiency 02/21/2015  . Coronary artery disease involving native coronary artery of native heart without angina pectoris 09/18/2014  . Paroxysmal A-fib ([place]) 09/01/2014  . Chronic systolic heart failure ([place]) 07/13/2014  .  CLL (chronic lymphocytic leukemia) ([place]) 02/07/2014  . 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 OSA on CPAP 02/07/2014  . Polycythemia 02/07/2014  . DJD (degenerative joint disease) of hip 06/24/2011    No Known Allergies  Past Surgical History:  Procedure Laterality Date  . CARDIAC DEFIBRILLATOR PLACEMENT  2015    Social History   Tobacco Use  .  Smoking status: Former Smoker    Packs/day: 2.00    Years: 42.00    Pack years: 84.00    Types: Cigarettes    Last attempt to quit: 1991    Years since quitting: 28.8  . Smokeless tobacco: Current User    Types: Chew  . Tobacco comment: smoking cessation materials not required  Substance Use Topics  . Alcohol use: Yes    Comment: 6 beers per month  . Drug use: No     Medication list has been reviewed and updated.  Current Meds  Medication Sig  . acyclovir (ZOVIRAX) 400 MG tablet TAKE 1 TABLET BY MOUTH ONCE DAILY TO PREVENT SHINGLES  . albuterol (PROVENTIL HFA;VENTOLIN HFA) 108 (90 Base) MCG/ACT inhaler Inhale 2 puffs into the lungs every 6 (six) hours as needed for wheezing or shortness of breath.  . allopurinol (ZYLOPRIM) 100 MG tablet TAKE 1 TABLET BY MOUTH ONCE EVERY EVENING  . dexamethasone (DECADRON) 4 MG tablet Take 1 tablet (4 mg total) by mouth daily. Take 1 tablet starting 2 days prior to infusion. Do not take on day of infusion  . Dextromethorphan Polistirex (ROBITUSSIN 12 HOUR COUGH PO) Take by mouth.  Marland Kitchen guaiFENesin (MUCINEX) 600 MG 12 hr tablet Take by mouth 2 (two) times daily.  Marland Kitchen levothyroxine (SYNTHROID, LEVOTHROID) 75 MCG tablet TAKE 1 TABLET BY MOUTH ONCE DAILY BEFOREBREAKFAST.  Marland Kitchen meclizine (ANTIVERT) 25 MG tablet Take 1 tablet (25 mg total) by mouth 2 (two) times daily as needed for dizziness.  . metoprolol tartrate (LOPRESSOR) 25 MG tablet Take 37.5 mg by mouth 2 (two) times daily.  . montelukast (SINGULAIR) 10 MG tablet TAKE 1 TABLET BY MOUTH AT BEDTIME START 2 DAYS PRIOR TO INFUSION FOR 4 DAYS  . rOPINIRole (REQUIP) 0.25 MG tablet TAKE 2 TABLETS BY MOUTH AT BEDTIME  . spironolactone (ALDACTONE) 25 MG tablet TAKE 1/2 TABLET BY MOUTH ONCE DAILY  . tamsulosin (FLOMAX) 0.4 MG CAPS capsule Take 1 capsule (0.4 mg total) by mouth daily after supper.  . tiotropium (SPIRIVA HANDIHALER) 18 MCG inhalation capsule Place 1 capsule (18 mcg total) into inhaler and inhale daily  for 30 doses.  Marland Kitchen torsemide (DEMADEX) 20 MG tablet Take 10 mg by mouth daily.   . Vitamin D, Cholecalciferol, 1000 units CAPS Take 2 capsules by mouth daily.    PHQ 2/9 Scores 07/22/2018 10/07/2017 08/17/2017  PHQ - 2 Score 0 0 0    Physical Exam  Constitutional: He is oriented to person, place, and time. He appears well-developed and well-nourished.  HENT:  Head: Normocephalic.  Right Ear: Tympanic membrane, external ear and ear canal normal.  Left Ear: Tympanic membrane, external ear and ear canal normal.  Nose: Nose normal.  Mouth/Throat: Uvula is midline and oropharynx is clear and moist.  Eyes: Pupils are equal, round, and reactive to light. Conjunctivae and EOM are normal.  Neck: Normal range of motion. Neck supple. Carotid bruit is not present. No thyromegaly present.  Cardiovascular: Normal rate, normal heart sounds and intact distal pulses. An irregularly irregular rhythm present.  Pulmonary/Chest: Effort normal. He has decreased breath sounds in the right  lower field. He has no wheezes. Right breast exhibits no mass. Left breast exhibits no mass.  Abdominal: Soft. Normal appearance and bowel sounds are normal. There is no hepatosplenomegaly. There is no tenderness.  Musculoskeletal: Normal range of motion. He exhibits edema (1+ edema lower legs).  Lymphadenopathy:    He has no cervical adenopathy.  Neurological: He is alert and oriented to person, place, and time. He has normal reflexes.  Skin: Skin is warm, dry and intact.  Psychiatric: He has a normal mood and affect. His speech is normal and behavior is normal. Judgment and thought content normal.  Nursing note and vitals reviewed.   BP 102/60 (BP Location: Left Arm, Patient Position: Sitting, Cuff Size: Normal)   Pulse 82   Ht 6\' 1"  (1.854 m)   Wt 217 lb (98.4 kg)   SpO2 97%   BMI 28.63 kg/m   Assessment and Plan: 1. Annual physical exam UA negative. Awaiting evaluation by Pulmonary - POCT Urinalysis  Dipstick  2. Acquired hypothyroidism Recent TSH normal  3. OSA on CPAP Continue nightly use  4. Coronary artery disease involving native coronary artery of native heart without angina pectoris Stable sx, likely in chronic Afib Eliquis stopped by Cardiology due to risk of bleeding (Sept '19) - Lipid panel  5. BPH associated with nocturia DRE deferred Continue Flomax - PSA  6. Gout of foot, unspecified cause, unspecified chronicity, unspecified laterality No recent episodes; consider stopping allopurinol - Uric acid  7. Paroxysmal A-fib (HCC) Rate controlled  8. Atherosclerosis of aorta (HCC) Not on statin therapy  9. CLL (chronic lymphocytic leukemia) ([place]) Followed by Oncology   Partially dictated using Editor, commissioning. Any errors are unintentional.  Halina Maidens, MD [facility]  07/22/2018

## 2018-07-23 ENCOUNTER — Encounter: Payer: Self-pay | Admitting: Pulmonary Disease

## 2018-07-23 ENCOUNTER — Ambulatory Visit (INDEPENDENT_AMBULATORY_CARE_PROVIDER_SITE_OTHER): Payer: Medicare Other | Admitting: Pulmonary Disease

## 2018-07-23 VITALS — BP 98/68 | HR 99 | Resp 16 | Ht 71.0 in | Wt 219.0 lb

## 2018-07-23 DIAGNOSIS — J449 Chronic obstructive pulmonary disease, unspecified: Secondary | ICD-10-CM

## 2018-07-23 DIAGNOSIS — J181 Lobar pneumonia, unspecified organism: Secondary | ICD-10-CM

## 2018-07-23 DIAGNOSIS — C9110 Chronic lymphocytic leukemia of B-cell type not having achieved remission: Secondary | ICD-10-CM

## 2018-07-23 DIAGNOSIS — J189 Pneumonia, unspecified organism: Secondary | ICD-10-CM

## 2018-07-23 DIAGNOSIS — R0600 Dyspnea, unspecified: Secondary | ICD-10-CM | POA: Diagnosis not present

## 2018-07-23 LAB — LIPID PANEL
CHOL/HDL RATIO: 3.5 ratio (ref 0.0–5.0)
Cholesterol, Total: 85 mg/dL — ABNORMAL LOW (ref 100–199)
HDL: 24 mg/dL — ABNORMAL LOW (ref >39)
LDL Calculated: 47 mg/dL (ref 0–99)
Triglycerides: 68 mg/dL (ref 0–149)
VLDL Cholesterol Cal: 14 mg/dL (ref 5–40)

## 2018-07-23 LAB — PSA: PROSTATE SPECIFIC AG, SERUM: 1.5 ng/mL (ref 0.0–4.0)

## 2018-07-23 LAB — URIC ACID: Uric Acid: 6.8 mg/dL (ref 3.7–8.6)

## 2018-07-23 MED ORDER — FLUTTER DEVI: 1.0000 | Freq: Four times a day (QID) | 0 refills | Status: AC

## 2018-07-23 MED ORDER — SPACER/AERO-HOLDING CHAMBERS DEVI: 1.0000 | Freq: Two times a day (BID) | 0 refills | Status: AC

## 2018-07-23 MED ORDER — AZITHROMYCIN 250 MG PO TABS: ORAL_TABLET | ORAL | 0 refills | Status: AC

## 2018-07-23 MED ORDER — PREDNISONE 20 MG PO TABS: 20.0000 mg | ORAL_TABLET | Freq: Every day | ORAL | 0 refills | Status: AC

## 2018-07-23 NOTE — Progress Notes (Signed)
Subjective:    Patient ID: Clifford Herrera, male    DOB: July 14, 1941, 77 y.o.   MRN: [id number]  HPI The patient is a 77 year old former smoker (1991) with a history of CLL who presents for evaluation of an abnormal CT scan of the chest and dyspnea after a bout of pneumonia. The patient is kindly referred by Dr. [name]. The patient started treatment for CLL with [facility] on 29 July. She had the second dose on 30 July and a third dose on 16 September. After that those the patient experienced issues with cough productive of purulent sputum and generalized malaise. He presented for a checkup and was treated with antibiotics. Subsequently he had a chest x-ray on 29 September that showed a dense retro cardiac infiltrate. He continued with antibiotics. Since then he has continued to feel generalized malaise and dyspnea particularly on exertion. His cough is productive now of white thick sputum that is hard for him to expect rate. He had a CT scan of the chest on 17 October that showed persistence of the infiltrate. However the changes appear old to be inflammatory and/or infectious. There appear to be some mucus plugging in the distal bronchi. Since his initial presentation with this illness the patient has not had any fevers, chills or sweats. He has generalized arthralgias and myalgias. He has noted some orthopnea and lower extremity edema. He has not had paroxysmal nocturnal dyspnea. He does note some tachy palpitations. He has not had any improvement of his symptoms with the provided antibiotics.  As noted the patient is a former smoker he started smoking in 1958 quit in 1991 and smoked 1 to 2 packs of cigarettes per day. He carries a diagnosis of COPD but has not had pulmonary evaluation previously. He has cats and dogs in the home but no other pets particularly exotic pets. He has no unusual hobbies. No military history. He has lived in [place] and in [place]. No other parts of the country. He has  not had any recent travel outside of the country. No exposure to tuberculosis that he is aware of.  The patient was diagnosed with obstructive sleep apnea in 2005 and is consistent with using a CPAP.  Past medical history, surgical history, family history has been reviewed.  Review of Systems  Constitutional: Positive for fatigue.  HENT: Positive for congestion.   Eyes: Negative.   Respiratory: Positive for cough and shortness of breath.   Cardiovascular: Positive for palpitations and leg swelling.       He is also having orthopnea.  Gastrointestinal: Negative.   Endocrine: Negative.   Genitourinary: Negative.   Musculoskeletal: Negative.   Skin: Negative.   Allergic/Immunologic: Negative.   Neurological:       Wife states that he has had some memory impairment since he started chemotherapy.  Hematological: Bruises/bleeds easily.  Psychiatric/Behavioral: Negative.   All other systems reviewed and are negative.    Objective:   Physical Exam  Constitutional: He is oriented to person, place, and time. He appears well-developed and well-nourished.  Non-toxic appearance. He appears ill (Chronically ill appearing). No distress.  HENT:  Head: Normocephalic and atraumatic.  Mouth/Throat: Oropharynx is clear and moist. No oropharyngeal exudate.  Eyes: Pupils are equal, round, and reactive to light.  Neck: Neck supple. No JVD present. No tracheal deviation present. No thyromegaly present.  Cardiovascular: Normal rate, regular rhythm, normal heart sounds and intact distal pulses.  No murmur heard. Pulmonary/Chest: Effort normal. He has wheezes (Diffusely). He  has rhonchi in the right lower field and the left lower field. He exhibits no tenderness.  Abdominal: Soft. He exhibits no distension. There is no tenderness.  Musculoskeletal:       Right lower leg: He exhibits edema (2+).       Left lower leg: He exhibits edema (2+).  Lymphadenopathy:    He has no cervical adenopathy.    Neurological: He is alert and oriented to person, place, and time.  No focal deficits.  Skin: Skin is warm and dry. Ecchymosis noted. No rash noted. He is not diaphoretic. No cyanosis. Nails show no clubbing.  Psychiatric: He has a normal mood and affect. His behavior is normal.    I have reviewed the patient's imaging independently and have also shown the patient the films. CT scan performed on 17 October shows bilateral infiltrates left greater than right. It appears to be consistent with pneumonia. This could represent organizing pneumonia which is an inflammatory sequela. His previously known adenopathy is unchanged.  The patient has spirometry today which shows an FEV1 1.5 L or 49% predicted this is consistent with moderate obstruction. There is suggestion of restriction however I suspect that this is due to technique and to equipment issues.       Assessment & Plan:   1) Dyspnea: patient is dyspnea could very well be multifactorial. He does have evidence of airway obstruction by physical exam and by spirometry. Because of his tenacious secretions will discontinue Spiriva which can dry his secretions further and switch him to Symbicort 160/4.5, two inhalations daily to see if this helps with this issue. Alternatively his dyspnea could be due to cardiac toxicity from chemotherapy as he has experienced some orthopnea, lower extremity edema and tachy palpitations. If his symptoms persist he may need 2D Echo to evaluate this issue.  2) Pneumonia, no organism specified: please note that radiographic clearance of pneumonia may take up to eight weeks after the initial event. The patient appears to have post inflammatory changes related to pneumonia very well be organizing pneumonia. Will give him a trial of prednisone, short course and Azithromycin Z pack (mostly for its anti-inflammatory effects) and follow-up with a CT scan of the chest in three weeks time. If there is no indication of resolution  of the process he may need bronchoscopy and this is made clear to the patient and his wife who was present with him today. The patient is having tenacious secretions likely due to ongoing airway inflammation and he will be treated with Symbicort as noted above. In addition he has been provided a flutter valve for management of airway secretions.  3) Chronic obstructive pulmonary disease: will initiate treatment as noted above. The patient will need pulmonary function testing once the clears this acute process.  4) CLL (chronic lymphocytic leukemia): this issue adds complexity to his management.   Thank you for allowing me to participate in this gentleman's care.

## 2018-07-23 NOTE — Patient Instructions (Addendum)
1) Prednisone 20 mg,  one tablet daily for five days.  2) azithromycin Z pack as per directions  3) do not use Spiriva. Instead you have been provided with Symbicort 160/4.5, two puffs twice a day use with a spacer. Rinse your mouth well after use it.  4) he will be provided with a flutter valve, use that at least four times a day to help clear mucus.  5) you will see you in three weeks with a repeat CT scan of the chest at that time.

## 2018-07-27 ENCOUNTER — Telehealth: Payer: Self-pay | Admitting: Pulmonary Disease

## 2018-07-27 NOTE — Telephone Encounter (Signed)
Spoke to patient. He just finished his Z-pak and prednisone today. I suggested he give it a few more days and allow the medication to work. I also suggested he use his flutter device and perhaps that will help to break up the mucus in his chest. He is agreeable and knows to call in a few days if he still is not improving.

## 2018-07-27 NOTE — Telephone Encounter (Signed)
Pt states he is not any better and would like something else called in. Please call to discuss.

## 2018-07-29 ENCOUNTER — Encounter: Payer: Self-pay | Admitting: Pulmonary Disease

## 2018-07-29 ENCOUNTER — Inpatient Hospital Stay: Payer: Medicare Other

## 2018-07-29 ENCOUNTER — Other Ambulatory Visit: Payer: Self-pay

## 2018-07-29 ENCOUNTER — Inpatient Hospital Stay (HOSPITAL_BASED_OUTPATIENT_CLINIC_OR_DEPARTMENT_OTHER): Payer: Medicare Other | Admitting: Internal Medicine

## 2018-07-29 ENCOUNTER — Encounter: Payer: Self-pay | Admitting: Internal Medicine

## 2018-07-29 VITALS — BP 96/56 | HR 104 | Resp 16 | Wt 214.0 lb

## 2018-07-29 DIAGNOSIS — R5383 Other fatigue: Secondary | ICD-10-CM

## 2018-07-29 DIAGNOSIS — I509 Heart failure, unspecified: Secondary | ICD-10-CM

## 2018-07-29 DIAGNOSIS — Z7901 Long term (current) use of anticoagulants: Secondary | ICD-10-CM

## 2018-07-29 DIAGNOSIS — C9110 Chronic lymphocytic leukemia of B-cell type not having achieved remission: Secondary | ICD-10-CM | POA: Diagnosis not present

## 2018-07-29 DIAGNOSIS — J181 Lobar pneumonia, unspecified organism: Principal | ICD-10-CM

## 2018-07-29 DIAGNOSIS — R0609 Other forms of dyspnea: Secondary | ICD-10-CM

## 2018-07-29 DIAGNOSIS — J189 Pneumonia, unspecified organism: Secondary | ICD-10-CM

## 2018-07-29 DIAGNOSIS — Z87891 Personal history of nicotine dependence: Secondary | ICD-10-CM

## 2018-07-29 DIAGNOSIS — I4891 Unspecified atrial fibrillation: Secondary | ICD-10-CM | POA: Diagnosis not present

## 2018-07-29 LAB — CBC WITH DIFFERENTIAL/PLATELET
Abs Immature Granulocytes: 0.05 10*3/uL (ref 0.00–0.07)
Basophils Absolute: 0 10*3/uL (ref 0.0–0.1)
Basophils Relative: 0 %
EOS ABS: 0 10*3/uL (ref 0.0–0.5)
Eosinophils Relative: 0 %
HEMATOCRIT: 40.2 % (ref 39.0–52.0)
HEMOGLOBIN: 13 g/dL (ref 13.0–17.0)
Immature Granulocytes: 1 %
LYMPHS PCT: 19 %
Lymphs Abs: 1 10*3/uL (ref 0.7–4.0)
MCH: 27.4 pg (ref 26.0–34.0)
MCHC: 32.3 g/dL (ref 30.0–36.0)
MCV: 84.6 fL (ref 80.0–100.0)
MONO ABS: 0.4 10*3/uL (ref 0.1–1.0)
Monocytes Relative: 8 %
Neutro Abs: 3.6 10*3/uL (ref 1.7–7.7)
Neutrophils Relative %: 72 %
Platelets: 77 10*3/uL — ABNORMAL LOW (ref 150–400)
RBC: 4.75 MIL/uL (ref 4.22–5.81)
RDW: 15.3 % (ref 11.5–15.5)
WBC: 5.1 10*3/uL (ref 4.0–10.5)
nRBC: 0 % (ref 0.0–0.2)

## 2018-07-29 LAB — COMPREHENSIVE METABOLIC PANEL
ALT: 18 U/L (ref 0–44)
AST: 23 U/L (ref 15–41)
Albumin: 3.5 g/dL (ref 3.5–5.0)
Alkaline Phosphatase: 53 U/L (ref 38–126)
Anion gap: 8 (ref 5–15)
BILIRUBIN TOTAL: 1.2 mg/dL (ref 0.3–1.2)
BUN: 17 mg/dL (ref 8–23)
CHLORIDE: 97 mmol/L — AB (ref 98–111)
CO2: 27 mmol/L (ref 22–32)
CREATININE: 1.18 mg/dL (ref 0.61–1.24)
Calcium: 8.3 mg/dL — ABNORMAL LOW (ref 8.9–10.3)
GFR calc Af Amer: >60 mL/min (ref >60)
GFR, EST NON AFRICAN AMERICAN: 58 mL/min — AB (ref >60)
Glucose, Bld: 110 mg/dL — ABNORMAL HIGH (ref 70–99)
Potassium: 4.2 mmol/L (ref 3.5–5.1)
Sodium: 132 mmol/L — ABNORMAL LOW (ref 135–145)
Total Protein: 5.4 g/dL — ABNORMAL LOW (ref 6.5–8.1)

## 2018-07-29 MED ORDER — HYDROCOD POLST-CPM POLST ER 10-8 MG/5ML PO SUER: 5.0000 mL | Freq: Every evening | ORAL | 0 refills | Status: DC | PRN

## 2018-07-29 NOTE — Assessment & Plan Note (Addendum)
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 STABLE.   # s/p Gazyva cycle #2; HOLD treatment today; see below.   # recent pneumonia/ persistent LLL infiltrate-on  CT scna; not improved; recent evaluation with pulmonary.  Await repeat imaging as planned.  # History of A. fib on Eliquis- on eliquis.  Stable.  #CHF on Demadex stable.  # continue Infectious prophylaxis: Acyclovir ; continue allopurinol.   # follow up in 1 months/labs-cbc/cmp- Dr.B

## 2018-07-29 NOTE — Progress Notes (Signed)
[facility] OFFICE PROGRESS NOTE  Patient Care Team: [name], MD as PCP - General (Internal Medicine) Corey Skains, MD as Consulting Physician (Cardiology) Cammie Sickle, MD as Medical Oncologist (Hematology and Oncology)  Cancer Staging No matching staging information was found for the patient.   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 Surveillance; [facility]- UNMUTATED [dec 2017]  # AUG 2018- FISH panel analysis was positive for loss of one 13q14 signal. NEGATIVE for CCND1/IGH, ATM, chromosome 12, and TP53 were normal. MAY 2019- : 85% OF NUCLEI POSITIVE FOR A 13Q DELETION  #CHF/CAD s/p Defib- on Eliquis  [Dr.Kowalski]; Orthostatic hypotension   -------------------------------------------------------------   DIAGNOSIS: CLL  STAGE:  IV ;GOALS: CONTROL/palliative  CURRENT/MOST RECENT THERAPY;: July 29th, 2019 GAZYVA      CLL (chronic lymphocytic leukemia) ([facility])   04/25/2018 -  Chemotherapy    The patient had obinutuzumab (GAZYVA) 100 mg in sodium chloride 0.9 % 100 mL (0.9615 mg/mL) chemo infusion, 100 mg, Intravenous, Once, 2 of 6 cycles Administration: 100 mg (04/26/2018), 900 mg (04/27/2018), 1,000 mg (06/14/2018), 1,000 mg (06/02/2018)  for chemotherapy treatment.        INTERVAL HISTORY:  Clifford Herrera 77 y.o.  male pleasant patient [name] currently on [name] is here for follow-up.  Patient currently status post cycle #2.   However further therapy is currently on hold because of recent pneumonia.  He was recently evaluated by pulmonary.   Continues to have ongoing fatigue.  No fevers or chills.  Continued cough.  Continued shortness of breath on exertion.  Review of Systems  Constitutional: Positive for malaise/fatigue. Negative for chills, diaphoresis, fever and weight loss.   HENT: Negative for nosebleeds and sore throat.   Eyes: Negative for double vision.  Respiratory: Positive for cough and shortness of breath. Negative for hemoptysis, sputum production and wheezing.   Cardiovascular: Negative for chest pain, palpitations, orthopnea and leg swelling.  Gastrointestinal: Negative for abdominal pain, blood in stool, constipation, diarrhea, heartburn, melena, nausea and vomiting.  Genitourinary: Negative for dysuria, frequency and urgency.  Musculoskeletal: Negative for joint pain.  Skin: Negative.  Negative for itching and rash.  Neurological: Negative for dizziness, tingling, focal weakness, weakness and headaches.  Endo/Heme/Allergies: Bruises/bleeds easily (improving).  Psychiatric/Behavioral: Positive for memory loss. Negative for depression. The patient is not nervous/anxious and does not have insomnia.       PAST MEDICAL HISTORY :  Past Medical History:  Diagnosis Date  . Chronic systolic CHF (congestive heart failure) ([facility])    25%  . CLL (chronic lymphocytic leukemia) ([place])   . COPD (chronic obstructive pulmonary disease) ([place])   . CPAP (continuous positive airway pressure) dependence   . Heart failure ([place])   . HOH (hard of hearing)   . Presence of automatic (implantable) cardiac defibrillator   . Sleep apnea   . Upper respiratory infection    chronic    PAST SURGICAL HISTORY :   Past Surgical History:  Procedure Laterality Date  . CARDIAC DEFIBRILLATOR PLACEMENT  2015    FAMILY HISTORY :   Family History  Problem Relation Age of Onset  . Diabetes Mother   . CAD Mother   . Diabetes Father   . CAD Father     SOCIAL HISTORY:   Social History   Tobacco Use  . Smoking status: Former Smoker  Packs/day: 2.00    Years: 42.00    Pack years: 84.00    Types: Cigarettes    Last attempt to quit: 1991    Years since quitting: 28.8  . Smokeless tobacco: Current User    Types: Chew  . Tobacco comment: smoking cessation materials not  required  Substance Use Topics  . Alcohol use: Yes    Comment: 6 beers per month  . Drug use: No    ALLERGIES:  has No Known Allergies.  MEDICATIONS:  Current Outpatient Medications  Medication Sig Dispense Refill  . acyclovir (ZOVIRAX) 400 MG tablet TAKE 1 TABLET BY MOUTH ONCE DAILY TO PREVENT SHINGLES (Patient taking differently: Take 400 mg by mouth daily. ) 30 tablet 3  . albuterol (PROVENTIL HFA;VENTOLIN HFA) 108 (90 Base) MCG/ACT inhaler Inhale 2 puffs into the lungs every 6 (six) hours as needed for wheezing or shortness of breath. 1 Inhaler 2  . allopurinol (ZYLOPRIM) 100 MG tablet TAKE 1 TABLET BY MOUTH ONCE EVERY EVENING (Patient taking differently: Take 100 mg by mouth every evening. ) 30 tablet 5  . apixaban (ELIQUIS) 5 MG TABS tablet Take 5 mg by mouth every 12 (twelve) hours.     Marland Kitchen dexamethasone (DECADRON) 4 MG tablet Take 1 tablet (4 mg total) by mouth daily. Take 1 tablet starting 2 days prior to infusion. Do not take on day of infusion (Patient not taking: Reported on 08/06/2018) 30 tablet 1  . Dextromethorphan Polistirex (ROBITUSSIN 12 HOUR COUGH PO) Take 5 mLs by mouth daily.     Marland Kitchen guaiFENesin (MUCINEX) 600 MG 12 hr tablet Take 600 mg by mouth 2 (two) times daily.     Marland Kitchen levothyroxine (SYNTHROID, LEVOTHROID) 75 MCG tablet TAKE 1 TABLET BY MOUTH ONCE DAILY BEFOREBREAKFAST. (Patient taking differently: Take 75 mcg by mouth daily before breakfast. ) 90 tablet 3  . meclizine (ANTIVERT) 25 MG tablet Take 1 tablet (25 mg total) by mouth 2 (two) times daily as needed for dizziness. 30 tablet 0  . metoprolol tartrate (LOPRESSOR) 25 MG tablet Take 25 mg by mouth 2 (two) times daily.     Marland Kitchen rOPINIRole (REQUIP) 0.25 MG tablet TAKE 2 TABLETS BY MOUTH AT BEDTIME (Patient taking differently: Take 0.5 mg by mouth at bedtime. ) 60 tablet 0  . Spacer/Aero-Holding Chambers DEVI 1 Device by Does not apply route 2 (two) times daily. 1 each 0  . spironolactone (ALDACTONE) 25 MG tablet TAKE 1/2  TABLET BY MOUTH ONCE DAILY (Patient taking differently: Take 12.5 mg by mouth daily. ) 45 tablet 3  . tamsulosin (FLOMAX) 0.4 MG CAPS capsule Take 1 capsule (0.4 mg total) by mouth daily after supper. 90 capsule 3  . tiotropium (SPIRIVA HANDIHALER) 18 MCG inhalation capsule Place 1 capsule (18 mcg total) into inhaler and inhale daily for 30 doses. 30 capsule 0  . Vitamin D, Cholecalciferol, 1000 units CAPS Take 2 capsules by mouth daily. (Patient taking differently: Take 2,000 Units by mouth daily. ) 60 capsule   . acetaminophen (TYLENOL) 500 MG tablet Take 1,000 mg by mouth every 6 (six) hours as needed for mild pain, moderate pain, fever or headache.    . ALPRAZolam (XANAX) 0.25 MG tablet Take 0.25 mg by mouth 2 (two) times daily as needed for anxiety.    . chlorpheniramine-HYDROcodone (TUSSIONEX) 10-8 MG/5ML SUER Take 5 mLs by mouth at bedtime as needed for cough. 140 mL 0  . montelukast (SINGULAIR) 10 MG tablet TAKE 1 TABLET BY MOUTH AT BEDTIME START  2 DAYS PRIOR TO INFUSION FOR 4 DAYS (Patient not taking: Reported on 08/06/2018) 30 tablet 2  . Respiratory Therapy Supplies (FLUTTER) DEVI 1 Device by Does not apply route 4 (four) times daily. 1 each 0  . torsemide (DEMADEX) 20 MG tablet Take 1 tablet (20 mg total) by mouth daily. Take 40 mg po bid for 3days and resume home dose of  20 mg po BID after 3days 30 tablet 0   No current facility-administered medications for this visit.     PHYSICAL EXAMINATION: ECOG PERFORMANCE STATUS: 1 - Symptomatic but completely ambulatory  BP (!) 96/56 (BP Location: Left Arm, Patient Position: Sitting)   Pulse (!) 104   Resp 16   Wt 214 lb (97.1 kg)   BMI 29.85 kg/m   Filed Weights   07/29/18 1044  Weight: 214 lb (97.1 kg)    Physical Exam  Constitutional: He is oriented to person, place, and time and well-developed, well-nourished, and in no distress.  Accompanied by his wife.  Is walking himself.  HENT:  Head: Normocephalic and atraumatic.   Mouth/Throat: Oropharynx is clear and moist. No oropharyngeal exudate.  Eyes: Pupils are equal, round, and reactive to light.  Neck: Normal range of motion. Neck supple.  Cardiovascular: Normal rate and regular rhythm.  Pulmonary/Chest: No respiratory distress. He has no wheezes.  Decreased air entry left more than right at the bases.  Abdominal: Soft. Bowel sounds are normal. He exhibits no distension and no mass. There is no tenderness. There is no rebound and no guarding.  Positive for splenomegaly.  Musculoskeletal: Normal range of motion. He exhibits no edema or tenderness.  Neurological: He is alert and oriented to person, place, and time.  Skin: Skin is warm.  Multiple bruises chronic-upper lower extremities.  Psychiatric: Affect normal.       LABORATORY DATA:  I have reviewed the data as listed    Component Value Date/Time   NA 132 (L) 08/07/2018 1022   NA 137 12/04/2017 1050   NA 136 07/25/2014 0414   K 3.4 (L) 08/07/2018 1022   K 5.6 (H) 07/25/2014 0414   CL 92 (L) 08/07/2018 1022   CL 101 07/25/2014 0414   CO2 29 08/07/2018 1022   CO2 24 07/25/2014 0414   GLUCOSE 170 (H) 08/07/2018 1022   GLUCOSE 143 (H) 07/25/2014 0414   BUN 19 08/07/2018 1022   BUN 15 12/04/2017 1050   BUN 33 (H) 07/25/2014 0414   CREATININE 1.24 08/07/2018 1022   CREATININE 1.34 (H) 01/15/2015 0956   CALCIUM 8.2 (L) 08/07/2018 1022   CALCIUM 8.3 (L) 07/25/2014 0414   PROT 6.1 (L) 08/06/2018 1336   PROT 5.8 (L) 08/17/2017 1551   PROT 6.4 07/24/2014 0937   ALBUMIN 3.8 08/06/2018 1336   ALBUMIN 4.3 08/17/2017 1551   ALBUMIN 4.0 07/24/2014 0937   AST 18 08/06/2018 1336   AST 31 07/24/2014 0937   ALT 11 08/06/2018 1336   ALT 23 07/24/2014 0937   ALKPHOS 55 08/06/2018 1336   ALKPHOS 53 07/24/2014 0937   BILITOT 1.9 (H) 08/06/2018 1336   BILITOT 0.4 08/17/2017 1551   BILITOT 1.1 (H) 07/24/2014 0937   GFRNONAA 54 (L) 08/07/2018 1022   GFRNONAA 52 (L) 01/15/2015 0956   GFRAA >60  08/07/2018 1022   GFRAA >60 01/15/2015 0956    No results found for: SPEP, UPEP  Lab Results  Component Value Date   WBC 11.8 (H) 08/06/2018   NEUTROABS 9.1 (H) 08/06/2018   HGB  14.1 08/06/2018   HCT 43.0 08/06/2018   MCV 84.6 08/06/2018   PLT 115 (L) 08/06/2018      Chemistry      Component Value Date/Time   NA 132 (L) 08/07/2018 1022   NA 137 12/04/2017 1050   NA 136 07/25/2014 0414   K 3.4 (L) 08/07/2018 1022   K 5.6 (H) 07/25/2014 0414   CL 92 (L) 08/07/2018 1022   CL 101 07/25/2014 0414   CO2 29 08/07/2018 1022   CO2 24 07/25/2014 0414   BUN 19 08/07/2018 1022   BUN 15 12/04/2017 1050   BUN 33 (H) 07/25/2014 0414   CREATININE 1.24 08/07/2018 1022   CREATININE 1.34 (H) 01/15/2015 0956      Component Value Date/Time   CALCIUM 8.2 (L) 08/07/2018 1022   CALCIUM 8.3 (L) 07/25/2014 0414   ALKPHOS 55 08/06/2018 1336   ALKPHOS 53 07/24/2014 0937   AST 18 08/06/2018 1336   AST 31 07/24/2014 0937   ALT 11 08/06/2018 1336   ALT 23 07/24/2014 0937   BILITOT 1.9 (H) 08/06/2018 1336   BILITOT 0.4 08/17/2017 1551   BILITOT 1.1 (H) 07/24/2014 7530       RADIOGRAPHIC STUDIES: I have personally reviewed the radiological images as listed and agreed with the findings in the report. No results found.   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 STABLE.   # s/p Gazyva cycle #2; HOLD treatment today; see below.   # recent pneumonia/ persistent LLL infiltrate-on  CT scna; not improved; recent evaluation with pulmonary.  Await repeat imaging as planned.  # History of A. fib on Eliquis- on eliquis.  Stable.  #CHF on Demadex stable.  # continue Infectious prophylaxis: Acyclovir ; continue allopurinol.   # follow up in 1  months/labs-cbc/cmp- Dr.B   No orders of the defined types were placed in this encounter.  All questions were answered. The patient knows to call the clinic with any problems, questions or concerns.      Cammie Sickle, MD 08/08/2018 5:39 PM

## 2018-08-03 ENCOUNTER — Telehealth: Payer: Self-pay | Admitting: Pulmonary Disease

## 2018-08-03 ENCOUNTER — Encounter: Payer: Self-pay | Admitting: Internal Medicine

## 2018-08-03 ENCOUNTER — Ambulatory Visit (INDEPENDENT_AMBULATORY_CARE_PROVIDER_SITE_OTHER): Payer: Medicare Other | Admitting: Internal Medicine

## 2018-08-03 VITALS — BP 98/64 | HR 125 | Temp 98.2°F | Ht 73.0 in | Wt 214.0 lb

## 2018-08-03 DIAGNOSIS — I5022 Chronic systolic (congestive) heart failure: Secondary | ICD-10-CM

## 2018-08-03 DIAGNOSIS — I480 Paroxysmal atrial fibrillation: Secondary | ICD-10-CM

## 2018-08-03 DIAGNOSIS — J181 Lobar pneumonia, unspecified organism: Secondary | ICD-10-CM | POA: Diagnosis not present

## 2018-08-03 DIAGNOSIS — J189 Pneumonia, unspecified organism: Secondary | ICD-10-CM

## 2018-08-03 NOTE — Telephone Encounter (Signed)
Patient wife calling States that patient is not getting any better PCP suggested to check and see if they can have CT Scan sooner than 11/13  Please call to discuss

## 2018-08-03 NOTE — Telephone Encounter (Signed)
Routed to Triage. Rhonda J Cobb

## 2018-08-03 NOTE — Progress Notes (Signed)
Date:  08/03/2018   Name:  Clifford Herrera   DOB:  [birth year]   MRN:  [id number]   Chief Complaint: Pneumonia (Worried not getting better. Still SOB. ) and Edema (Both ankles are swollen. Not painful. Been using compression stocking. Not helping. )  Shortness of Breath  This is a chronic problem. The current episode started more than 1 month ago. The problem occurs every few minutes. The problem has been unchanged. Associated symptoms include leg swelling and orthopnea. Pertinent negatives include no abdominal pain, chest pain, fever, headaches, sputum production, vomiting or wheezing. The symptoms are aggravated by lying flat and any activity. He has tried beta agonist inhalers and body position changes for the symptoms. His past medical history is significant for CAD and pneumonia. (Completed courses of Levaquin then Zpak + prednisone in the past month.)  He had abnormal CXR suggesting infiltrate vs atelectasis.  He then had a CT chest showing bibasilar opacities.  Adenopathy was reported stable. He reports more LE edema and having to sleep in the recliner due to cough.  He is taking tussionex given by Oncology which does allow him to rest.  He uses compression stockings daily. He is fatigued but without fever or chills.  He is eating and drinking fairly normally.  He had a repeat CT chest scheduled next week.  Review of Systems  Constitutional: Positive for fatigue. Negative for chills, diaphoresis and fever.  HENT: Negative for trouble swallowing.   Respiratory: Positive for chest tightness and shortness of breath. Negative for sputum production and wheezing.   Cardiovascular: Positive for orthopnea and leg swelling. Negative for chest pain and palpitations.  Gastrointestinal: Negative for abdominal pain, diarrhea and vomiting.  Neurological: Negative for dizziness, light-headedness and headaches.  Hematological: Negative for adenopathy.    Patient Active Problem List   Diagnosis Date  Noted  . Atherosclerosis of aorta ([place]) 07/21/2018  . Pneumonia of left lower lobe due to infectious organism ([place]) 06/29/2018  . Hyponatremia 06/29/2018  . Goals of care, counseling/discussion 04/11/2018  . Automatic implantable cardioverter-defibrillator in situ 11/16/2017  . Vitamin D deficiency 09/16/2017  . Hypothyroidism 09/16/2017  . BPH associated with nocturia 08/20/2017  . Allergic rhinitis 08/17/2017  . Gait instability 08/17/2017  . Hypocalcemia 08/17/2017  . CKD (chronic kidney disease) stage 3, GFR 30-59 ml/min (HCC) 08/17/2017  . Gout 08/17/2017  . RLS (restless legs syndrome) 10/23/2016  . 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 Moderate mitral insufficiency 02/21/2015  . Coronary artery disease involving native coronary artery of native heart without angina pectoris 09/18/2014  . Paroxysmal A-fib ([place]) 09/01/2014  . Chronic systolic heart failure ([place]) 07/13/2014  . CLL (chronic lymphocytic leukemia) ([place]) 02/07/2014  . COPD, mild ([place]) 02/07/2014  . OSA on CPAP 02/07/2014  . Polycythemia 02/07/2014  . DJD (degenerative joint disease) of hip 06/24/2011    No Known Allergies  Past Surgical History:  Procedure Laterality Date  . CARDIAC DEFIBRILLATOR PLACEMENT  2015    Social History   Tobacco Use  . Smoking status: Former Smoker    Packs/day: 2.00    Years: 42.00    Pack years: 84.00    Types: Cigarettes    Last attempt to quit: 1991    Years since quitting: 28.8  . Smokeless tobacco: Current User    Types: Chew  . Tobacco comment: smoking cessation materials not required  Substance Use Topics  . Alcohol use: Yes    Comment: 6  beers per month  . Drug use: No     Medication list has been reviewed and updated.  Current Meds  Medication Sig  . acyclovir (ZOVIRAX) 400 MG tablet TAKE 1 TABLET BY MOUTH ONCE DAILY TO PREVENT SHINGLES  . albuterol (PROVENTIL HFA;VENTOLIN HFA) 108 (90 Base) MCG/ACT inhaler Inhale  2 puffs into the lungs every 6 (six) hours as needed for wheezing or shortness of breath.  . allopurinol (ZYLOPRIM) 100 MG tablet TAKE 1 TABLET BY MOUTH ONCE EVERY EVENING  . apixaban (ELIQUIS) 5 MG TABS tablet TAKE 1 TABLET EVERY 12 HOURS  . chlorpheniramine-HYDROcodone (TUSSIONEX) 10-8 MG/5ML SUER Take 5 mLs by mouth at bedtime as needed for cough.  . dexamethasone (DECADRON) 4 MG tablet Take 1 tablet (4 mg total) by mouth daily. Take 1 tablet starting 2 days prior to infusion. Do not take on day of infusion  . Dextromethorphan Polistirex (ROBITUSSIN 12 HOUR COUGH PO) Take by mouth.  Marland Kitchen guaiFENesin (MUCINEX) 600 MG 12 hr tablet Take by mouth 2 (two) times daily.  Marland Kitchen levothyroxine (SYNTHROID, LEVOTHROID) 75 MCG tablet TAKE 1 TABLET BY MOUTH ONCE DAILY BEFOREBREAKFAST.  Marland Kitchen meclizine (ANTIVERT) 25 MG tablet Take 1 tablet (25 mg total) by mouth 2 (two) times daily as needed for dizziness.  . metoprolol tartrate (LOPRESSOR) 25 MG tablet Take 37.5 mg by mouth 2 (two) times daily.  . montelukast (SINGULAIR) 10 MG tablet TAKE 1 TABLET BY MOUTH AT BEDTIME START 2 DAYS PRIOR TO INFUSION FOR 4 DAYS  . Respiratory Therapy Supplies (FLUTTER) DEVI 1 Device by Does not apply route 4 (four) times daily.  Marland Kitchen rOPINIRole (REQUIP) 0.25 MG tablet TAKE 2 TABLETS BY MOUTH AT BEDTIME  . Spacer/Aero-Holding Chambers DEVI 1 Device by Does not apply route 2 (two) times daily.  Marland Kitchen spironolactone (ALDACTONE) 25 MG tablet TAKE 1/2 TABLET BY MOUTH ONCE DAILY  . tamsulosin (FLOMAX) 0.4 MG CAPS capsule Take 1 capsule (0.4 mg total) by mouth daily after supper.  . tiotropium (SPIRIVA HANDIHALER) 18 MCG inhalation capsule Place 1 capsule (18 mcg total) into inhaler and inhale daily for 30 doses.  Marland Kitchen torsemide (DEMADEX) 20 MG tablet Take 10 mg by mouth daily.   . Vitamin D, Cholecalciferol, 1000 units CAPS Take 2 capsules by mouth daily.    PHQ 2/9 Scores 07/22/2018 10/07/2017 08/17/2017  PHQ - 2 Score 0 0 0    Physical Exam    Constitutional: He appears well-developed. No distress.  Eyes: Pupils are equal, round, and reactive to light.  Neck: Normal range of motion. JVD present.  Cardiovascular: An irregularly irregular rhythm present. Tachycardia present.  Pulmonary/Chest: No accessory muscle usage. No respiratory distress. He has wheezes in the right lower field, the left upper field and the left lower field. He has rhonchi. He has no rales.  Abdominal: Soft. Normal appearance and bowel sounds are normal. There is no tenderness.  Skin: He is not diaphoretic.  Psychiatric: He has a normal mood and affect.    BP 98/64 (BP Location: Right Arm, Patient Position: Sitting, Cuff Size: Normal)   Pulse (!) 125   Temp 98.2 F (36.8 C) (Oral)   Ht 6\' 1"  (1.854 m)   Wt 214 lb (97.1 kg)   SpO2 95%   BMI 28.23 kg/m   Assessment and Plan: 1. Chronic systolic heart failure (HCC) Increase metoprolol to 50 mg bid x 1 week Increase torsemide to 10 mg bid x 1 week Elevate legs above the heart  2. Paroxysmal A-fib ([place])  Increased HR contributing to fatigue and edema  3. Pneumonia of left lower lobe due to infectious organism [facility]) No indication for further antibiotics at this time Repeat CT scheduled for next week then follow up with Pulmonary   Partially dictated using Dragon software. Any errors are unintentional.  Halina Maidens, MD [facility]  08/03/2018

## 2018-08-03 NOTE — Patient Instructions (Addendum)
Increase Metoprolol to 2 tablets twice a day for the next week then resume previous dose  Increase Torsemide to 2 tablets at day - one in the morning and one at mid afternoon (around 3 pm) for one week then resume previous dose

## 2018-08-03 NOTE — Telephone Encounter (Signed)
LM on VM for patient that per Dr. [name], moving the CT scan up sooner wouldn't really help. Suggested he keep 11/13 CT scan apt. Also reminded him that can take up to 8 weeks to feel fully better. LM for patient to call with any further questions.

## 2018-08-05 ENCOUNTER — Telehealth: Payer: Self-pay | Admitting: Pulmonary Disease

## 2018-08-05 NOTE — Telephone Encounter (Signed)
°*  STAT* If patient is at the pharmacy, call can be transferred to refill team.   1. Which medications need to be refilled? (please list name of each medication and dose if known) Symbicort   2. Which pharmacy/location (including street and city if local pharmacy) is medication to be sent to? [facility]   3. Do they need a 30 day or 90 day supply?

## 2018-08-06 ENCOUNTER — Observation Stay
Admission: EM | Admit: 2018-08-06 | Discharge: 2018-08-08 | Disposition: A | Discharge status: Home / Self Care | Payer: Medicare Other | Attending: Internal Medicine | Admitting: Internal Medicine

## 2018-08-06 ENCOUNTER — Emergency Department: Payer: Medicare Other

## 2018-08-06 ENCOUNTER — Other Ambulatory Visit: Payer: Self-pay

## 2018-08-06 DIAGNOSIS — R351 Nocturia: Secondary | ICD-10-CM | POA: Diagnosis not present

## 2018-08-06 DIAGNOSIS — Z66 Do not resuscitate: Secondary | ICD-10-CM | POA: Insufficient documentation

## 2018-08-06 DIAGNOSIS — I480 Paroxysmal atrial fibrillation: Secondary | ICD-10-CM | POA: Diagnosis not present

## 2018-08-06 DIAGNOSIS — Z79899 Other long term (current) drug therapy: Secondary | ICD-10-CM | POA: Insufficient documentation

## 2018-08-06 DIAGNOSIS — Z8572 Personal history of non-Hodgkin lymphomas: Secondary | ICD-10-CM | POA: Insufficient documentation

## 2018-08-06 DIAGNOSIS — I428 Other cardiomyopathies: Secondary | ICD-10-CM | POA: Insufficient documentation

## 2018-08-06 DIAGNOSIS — M109 Gout, unspecified: Secondary | ICD-10-CM | POA: Diagnosis not present

## 2018-08-06 DIAGNOSIS — E559 Vitamin D deficiency, unspecified: Secondary | ICD-10-CM | POA: Diagnosis not present

## 2018-08-06 DIAGNOSIS — I700 Atherosclerosis of aorta: Secondary | ICD-10-CM | POA: Diagnosis not present

## 2018-08-06 DIAGNOSIS — F17220 Nicotine dependence, chewing tobacco, uncomplicated: Secondary | ICD-10-CM | POA: Diagnosis not present

## 2018-08-06 DIAGNOSIS — G4733 Obstructive sleep apnea (adult) (pediatric): Secondary | ICD-10-CM | POA: Diagnosis not present

## 2018-08-06 DIAGNOSIS — R161 Splenomegaly, not elsewhere classified: Secondary | ICD-10-CM | POA: Diagnosis not present

## 2018-08-06 DIAGNOSIS — Z7901 Long term (current) use of anticoagulants: Secondary | ICD-10-CM | POA: Diagnosis not present

## 2018-08-06 DIAGNOSIS — N183 Chronic kidney disease, stage 3 (moderate): Secondary | ICD-10-CM | POA: Insufficient documentation

## 2018-08-06 DIAGNOSIS — Z95810 Presence of automatic (implantable) cardiac defibrillator: Secondary | ICD-10-CM | POA: Insufficient documentation

## 2018-08-06 DIAGNOSIS — R0602 Shortness of breath: Secondary | ICD-10-CM | POA: Diagnosis present

## 2018-08-06 DIAGNOSIS — G2581 Restless legs syndrome: Secondary | ICD-10-CM | POA: Diagnosis not present

## 2018-08-06 DIAGNOSIS — I5022 Chronic systolic (congestive) heart failure: Secondary | ICD-10-CM | POA: Diagnosis present

## 2018-08-06 DIAGNOSIS — I5020 Unspecified systolic (congestive) heart failure: Secondary | ICD-10-CM

## 2018-08-06 DIAGNOSIS — I5023 Acute on chronic systolic (congestive) heart failure: Secondary | ICD-10-CM | POA: Insufficient documentation

## 2018-08-06 DIAGNOSIS — Z8249 Family history of ischemic heart disease and other diseases of the circulatory system: Secondary | ICD-10-CM | POA: Insufficient documentation

## 2018-08-06 DIAGNOSIS — N401 Enlarged prostate with lower urinary tract symptoms: Secondary | ICD-10-CM | POA: Diagnosis not present

## 2018-08-06 DIAGNOSIS — Z79890 Hormone replacement therapy (postmenopausal): Secondary | ICD-10-CM | POA: Diagnosis not present

## 2018-08-06 DIAGNOSIS — D696 Thrombocytopenia, unspecified: Secondary | ICD-10-CM | POA: Diagnosis not present

## 2018-08-06 DIAGNOSIS — J40 Bronchitis, not specified as acute or chronic: Secondary | ICD-10-CM | POA: Insufficient documentation

## 2018-08-06 DIAGNOSIS — I2510 Atherosclerotic heart disease of native coronary artery without angina pectoris: Secondary | ICD-10-CM | POA: Insufficient documentation

## 2018-08-06 DIAGNOSIS — J440 Chronic obstructive pulmonary disease with acute lower respiratory infection: Principal | ICD-10-CM | POA: Insufficient documentation

## 2018-08-06 DIAGNOSIS — E039 Hypothyroidism, unspecified: Secondary | ICD-10-CM | POA: Diagnosis not present

## 2018-08-06 HISTORY — DX: Chronic obstructive pulmonary disease, unspecified: J44.9

## 2018-08-06 HISTORY — DX: Chronic systolic (congestive) heart failure: I50.22

## 2018-08-06 LAB — CBC WITH DIFFERENTIAL/PLATELET
Abs Immature Granulocytes: 0.11 10*3/uL — ABNORMAL HIGH (ref 0.00–0.07)
BASOS ABS: 0 10*3/uL (ref 0.0–0.1)
Basophils Relative: 0 %
Eosinophils Absolute: 0 10*3/uL (ref 0.0–0.5)
Eosinophils Relative: 0 %
HEMATOCRIT: 43 % (ref 39.0–52.0)
Hemoglobin: 14.1 g/dL (ref 13.0–17.0)
IMMATURE GRANULOCYTES: 1 %
LYMPHS PCT: 16 %
Lymphs Abs: 1.9 10*3/uL (ref 0.7–4.0)
MCH: 27.8 pg (ref 26.0–34.0)
MCHC: 32.8 g/dL (ref 30.0–36.0)
MCV: 84.6 fL (ref 80.0–100.0)
Monocytes Absolute: 0.6 10*3/uL (ref 0.1–1.0)
Monocytes Relative: 5 %
NEUTROS ABS: 9.1 10*3/uL — AB (ref 1.7–7.7)
NEUTROS PCT: 78 %
NRBC: 0 % (ref 0.0–0.2)
Platelets: 115 10*3/uL — ABNORMAL LOW (ref 150–400)
RBC: 5.08 MIL/uL (ref 4.22–5.81)
RDW: 16 % — AB (ref 11.5–15.5)
WBC: 11.8 10*3/uL — ABNORMAL HIGH (ref 4.0–10.5)

## 2018-08-06 LAB — URINALYSIS, COMPLETE (UACMP) WITH MICROSCOPIC
BILIRUBIN URINE: NEGATIVE
Bacteria, UA: NONE SEEN
Glucose, UA: NEGATIVE mg/dL
KETONES UR: 5 mg/dL — AB
Leukocytes, UA: NEGATIVE
Nitrite: NEGATIVE
PH: 5 (ref 5.0–8.0)
PROTEIN: NEGATIVE mg/dL
SQUAMOUS EPITHELIAL / LPF: NONE SEEN (ref 0–5)
Specific Gravity, Urine: 1.021 (ref 1.005–1.030)

## 2018-08-06 LAB — COMPREHENSIVE METABOLIC PANEL
ALBUMIN: 3.8 g/dL (ref 3.5–5.0)
ALT: 11 U/L (ref 0–44)
ANION GAP: 11 (ref 5–15)
AST: 18 U/L (ref 15–41)
Alkaline Phosphatase: 55 U/L (ref 38–126)
BUN: 18 mg/dL (ref 8–23)
CO2: 28 mmol/L (ref 22–32)
Calcium: 8.4 mg/dL — ABNORMAL LOW (ref 8.9–10.3)
Chloride: 92 mmol/L — ABNORMAL LOW (ref 98–111)
Creatinine, Ser: 1.36 mg/dL — ABNORMAL HIGH (ref 0.61–1.24)
GFR calc Af Amer: 56 mL/min — ABNORMAL LOW (ref >60)
GFR calc non Af Amer: 49 mL/min — ABNORMAL LOW (ref >60)
GLUCOSE: 106 mg/dL — AB (ref 70–99)
POTASSIUM: 4.3 mmol/L (ref 3.5–5.1)
SODIUM: 131 mmol/L — AB (ref 135–145)
Total Bilirubin: 1.9 mg/dL — ABNORMAL HIGH (ref 0.3–1.2)
Total Protein: 6.1 g/dL — ABNORMAL LOW (ref 6.5–8.1)

## 2018-08-06 LAB — BRAIN NATRIURETIC PEPTIDE: B Natriuretic Peptide: 803 pg/mL — ABNORMAL HIGH (ref 0.0–100.0)

## 2018-08-06 LAB — FIBRIN DERIVATIVES D-DIMER (ARMC ONLY): Fibrin derivatives D-dimer (ARMC): 980.9 ng/mL (FEU) — ABNORMAL HIGH (ref 0.00–499.00)

## 2018-08-06 LAB — TROPONIN I: Troponin I: <0.03 ng/mL (ref <0.03)

## 2018-08-06 MED ORDER — ACETAMINOPHEN 650 MG RE SUPP: 650.0000 mg | Freq: Four times a day (QID) | RECTAL | Status: DC | PRN

## 2018-08-06 MED ORDER — IPRATROPIUM-ALBUTEROL 0.5-2.5 (3) MG/3ML IN SOLN
3.0000 mL | Freq: Four times a day (QID) | RESPIRATORY_TRACT | Status: DC
  Administered 2018-08-06 – 2018-08-07 (×5): 3 mL via RESPIRATORY_TRACT
  Filled 2018-08-06 (×5): qty 3

## 2018-08-06 MED ORDER — MECLIZINE HCL 25 MG PO TABS
25.0000 mg | ORAL_TABLET | Freq: Two times a day (BID) | ORAL | Status: DC | PRN
  Filled 2018-08-06: qty 1

## 2018-08-06 MED ORDER — LEVOTHYROXINE SODIUM 50 MCG PO TABS
75.0000 ug | ORAL_TABLET | Freq: Every day | ORAL | Status: DC
  Administered 2018-08-07 – 2018-08-08 (×2): 75 ug via ORAL
  Filled 2018-08-06 (×2): qty 1

## 2018-08-06 MED ORDER — ALBUTEROL SULFATE (2.5 MG/3ML) 0.083% IN NEBU: 2.5000 mg | INHALATION_SOLUTION | RESPIRATORY_TRACT | Status: DC | PRN

## 2018-08-06 MED ORDER — TIOTROPIUM BROMIDE MONOHYDRATE 18 MCG IN CAPS
18.0000 ug | ORAL_CAPSULE | Freq: Every day | RESPIRATORY_TRACT | Status: DC
  Filled 2018-08-06: qty 5

## 2018-08-06 MED ORDER — ONDANSETRON HCL 4 MG PO TABS: 4.0000 mg | ORAL_TABLET | Freq: Four times a day (QID) | ORAL | Status: DC | PRN

## 2018-08-06 MED ORDER — PREDNISONE 50 MG PO TABS
50.0000 mg | ORAL_TABLET | Freq: Every day | ORAL | Status: DC
  Administered 2018-08-06: 50 mg via ORAL
  Filled 2018-08-06: qty 1

## 2018-08-06 MED ORDER — TAMSULOSIN HCL 0.4 MG PO CAPS
0.4000 mg | ORAL_CAPSULE | Freq: Every day | ORAL | Status: DC
  Administered 2018-08-06 – 2018-08-07 (×2): 0.4 mg via ORAL
  Filled 2018-08-06 (×2): qty 1

## 2018-08-06 MED ORDER — TORSEMIDE 20 MG PO TABS
60.0000 mg | ORAL_TABLET | Freq: Two times a day (BID) | ORAL | Status: DC
  Administered 2018-08-06 – 2018-08-08 (×4): 60 mg via ORAL
  Filled 2018-08-06 (×5): qty 3

## 2018-08-06 MED ORDER — POLYETHYLENE GLYCOL 3350 17 G PO PACK: 17.0000 g | PACK | Freq: Every day | ORAL | Status: DC | PRN

## 2018-08-06 MED ORDER — APIXABAN 5 MG PO TABS
5.0000 mg | ORAL_TABLET | Freq: Two times a day (BID) | ORAL | Status: DC
  Administered 2018-08-06 – 2018-08-08 (×4): 5 mg via ORAL
  Filled 2018-08-06 (×4): qty 1

## 2018-08-06 MED ORDER — SODIUM CHLORIDE 0.9% FLUSH
3.0000 mL | Freq: Two times a day (BID) | INTRAVENOUS | Status: DC
  Administered 2018-08-06 – 2018-08-08 (×4): 3 mL via INTRAVENOUS

## 2018-08-06 MED ORDER — ONDANSETRON HCL 4 MG/2ML IJ SOLN: 4.0000 mg | Freq: Four times a day (QID) | INTRAMUSCULAR | Status: DC | PRN

## 2018-08-06 MED ORDER — ACYCLOVIR 200 MG PO CAPS
400.0000 mg | ORAL_CAPSULE | Freq: Every day | ORAL | Status: DC
  Administered 2018-08-07 – 2018-08-08 (×2): 400 mg via ORAL
  Filled 2018-08-06 (×2): qty 2

## 2018-08-06 MED ORDER — GUAIFENESIN ER 600 MG PO TB12
600.0000 mg | ORAL_TABLET | Freq: Two times a day (BID) | ORAL | Status: DC
  Administered 2018-08-06 – 2018-08-08 (×4): 600 mg via ORAL
  Filled 2018-08-06 (×5): qty 1

## 2018-08-06 MED ORDER — ALPRAZOLAM 0.25 MG PO TABS
0.2500 mg | ORAL_TABLET | Freq: Two times a day (BID) | ORAL | Status: DC | PRN
  Administered 2018-08-06 – 2018-08-08 (×2): 0.25 mg via ORAL
  Filled 2018-08-06 (×2): qty 1

## 2018-08-06 MED ORDER — ROPINIROLE HCL 1 MG PO TABS
0.5000 mg | ORAL_TABLET | Freq: Every day | ORAL | Status: DC
  Administered 2018-08-06 – 2018-08-07 (×2): 0.5 mg via ORAL
  Filled 2018-08-06 (×2): qty 1

## 2018-08-06 MED ORDER — ENOXAPARIN SODIUM 40 MG/0.4ML ~~LOC~~ SOLN: 40.0000 mg | SUBCUTANEOUS | Status: DC

## 2018-08-06 MED ORDER — METOPROLOL TARTRATE 25 MG PO TABS
25.0000 mg | ORAL_TABLET | Freq: Two times a day (BID) | ORAL | Status: DC
  Administered 2018-08-06 – 2018-08-08 (×4): 25 mg via ORAL
  Filled 2018-08-06 (×4): qty 1

## 2018-08-06 MED ORDER — IOPAMIDOL (ISOVUE-370) INJECTION 76%
75.0000 mL | Freq: Once | INTRAVENOUS | Status: AC | PRN
  Administered 2018-08-06: 75 mL via INTRAVENOUS

## 2018-08-06 MED ORDER — TETANUS-DIPHTH-ACELL PERTUSSIS 5-2.5-18.5 LF-MCG/0.5 IM SUSP
0.5000 mL | Freq: Once | INTRAMUSCULAR | Status: AC
  Administered 2018-08-06: 0.5 mL via INTRAMUSCULAR
  Filled 2018-08-06: qty 0.5

## 2018-08-06 MED ORDER — ACETAMINOPHEN 325 MG PO TABS: 650.0000 mg | ORAL_TABLET | Freq: Four times a day (QID) | ORAL | Status: DC | PRN

## 2018-08-06 NOTE — ED Notes (Signed)
Attempted report to floor.  

## 2018-08-06 NOTE — Progress Notes (Signed)
Advance care planning  Purpose of Encounter Congestive heart failure, COPD, CODE STATUS discussion  Parties in Attendance Patient and daughter-Clifford Herrera  Patients Decisional capacity Alert and oriented and able to make medical decisions  Patient does not have documented healthcare power of attorney.  Wants daughter and wife to be making decisions if he is unable to.  Discussed regarding patient's post infectious bronchitis along with congestive heart failure.  Prognosis and treatment plan discussed.  All questions answered.  We discussed regarding CODE STATUS.  Patient wants to be DO NOT RESUSCITATE DO NOT INTUBATE.  CODE STATUS changed and orders entered.  Time spent - 17 minutes

## 2018-08-06 NOTE — ED Provider Notes (Signed)
[facility] Emergency Department Provider Note   ____________________________________________   First MD Initiated Contact with Patient 08/06/18 1322     (approximate)  I have reviewed the triage vital signs and the nursing notes.   HISTORY  Chief Complaint Shortness of Breath    HPI Clifford Herrera is a 77 y.o. male with a history of CLL getting antibody therapy for his becoming more short of breath.  For the last 3 weeks or so he is becoming increasingly short of breath.  He was felt to have pneumonia and tried on 3 different antibiotics including Levaquin and Z-Pak.  He continues to get worse.  Coughing up thick tan sputum.  He is not eating or drinking much either.  He is feeling weak.  He was thought to be having increased congestive heart failure 2 and was given increased furosemide but he still getting more short of breath.  Is not having any chest pain fever nausea or vomiting.   Past Medical History:  Diagnosis Date  . CLL (chronic lymphocytic leukemia) ([place])   . CPAP (continuous positive airway pressure) dependence   . Heart failure ([place])   . HOH (hard of hearing)   . Presence of automatic (implantable) cardiac defibrillator   . Sleep apnea   . Upper respiratory infection    chronic    Patient Active Problem List   Diagnosis Date Noted  . Atherosclerosis of aorta ([place]) 07/21/2018  . Pneumonia of left lower lobe due to infectious organism ([place]) 06/29/2018  . Hyponatremia 06/29/2018  . Goals of care, counseling/discussion 04/11/2018  . Automatic implantable cardioverter-defibrillator in situ 11/16/2017  . Vitamin D deficiency 09/16/2017  . Hypothyroidism 09/16/2017  . BPH associated with nocturia 08/20/2017  . Allergic rhinitis 08/17/2017  . Gait instability 08/17/2017  . Hypocalcemia 08/17/2017  . CKD (chronic kidney disease) stage 3, GFR 30-59 ml/min (HCC) 08/17/2017  . Gout 08/17/2017  . RLS (restless legs syndrome) 10/23/2016  .  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 Moderate mitral insufficiency 02/21/2015  . Coronary artery disease involving native coronary artery of native heart without angina pectoris 09/18/2014  . Paroxysmal A-fib ([place]) 09/01/2014  . Chronic systolic heart failure ([place]) 07/13/2014  . CLL (chronic lymphocytic leukemia) ([place]) 02/07/2014  . COPD, mild ([place]) 02/07/2014  . OSA on CPAP 02/07/2014  . Polycythemia 02/07/2014  . 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 CARDIAC DEFIBRILLATOR PLACEMENT  2015    Prior to Admission medications   Medication Sig Start Date End Date Taking? Authorizing Provider  acyclovir (ZOVIRAX) 400 MG tablet TAKE 1 TABLET BY MOUTH ONCE DAILY TO PREVENT SHINGLES 07/19/18   Cammie Sickle, MD  albuterol (PROVENTIL HFA;VENTOLIN HFA) 108 (90 Base) MCG/ACT inhaler Inhale 2 puffs into the lungs every 6 (six) hours as needed for wheezing or shortness of breath. 07/12/18   Cammie Sickle, MD  allopurinol (ZYLOPRIM) 100 MG tablet TAKE 1 TABLET BY MOUTH ONCE EVERY EVENING 07/15/18   [name], MD  apixaban (ELIQUIS) 5 MG TABS tablet TAKE 1 TABLET EVERY 12 HOURS 05/27/17   [provider]  chlorpheniramine-HYDROcodone (TUSSIONEX) 10-8 MG/5ML SUER Take 5 mLs by mouth at bedtime as needed for cough. 07/29/18   Cammie Sickle, MD  dexamethasone (DECADRON) 4 MG tablet Take 1 tablet (4 mg total) by mouth daily. Take 1 tablet starting 2 days prior to infusion. Do  not take on day of infusion 04/06/18   Cammie Sickle, MD  Dextromethorphan Polistirex (ROBITUSSIN 12 HOUR COUGH PO) Take by mouth.    [provider]  guaiFENesin (MUCINEX) 600 MG 12 hr tablet Take by mouth 2 (two) times daily.    [provider]  levothyroxine (SYNTHROID, LEVOTHROID) 75 MCG tablet TAKE 1 TABLET BY MOUTH ONCE DAILY BEFOREBREAKFAST. 01/18/18   Plonk,  Gwyndolyn Saxon, MD  meclizine (ANTIVERT) 25 MG tablet Take 1 tablet (25 mg total) by mouth 2 (two) times daily as needed for dizziness. 07/01/18   Verlon Au, NP  metoprolol tartrate (LOPRESSOR) 25 MG tablet Take 37.5 mg by mouth 2 (two) times daily.    [provider]  montelukast (SINGULAIR) 10 MG tablet TAKE 1 TABLET BY MOUTH AT BEDTIME START 2 DAYS PRIOR TO INFUSION FOR 4 DAYS 07/19/18   Cammie Sickle, MD  Respiratory Therapy Supplies (FLUTTER) DEVI 1 Device by Does not apply route 4 (four) times daily. 07/23/18   [name], MD  rOPINIRole (REQUIP) 0.25 MG tablet TAKE 2 TABLETS BY MOUTH AT BEDTIME 07/15/18   Cammie Sickle, MD  Spacer/Aero-Holding Chambers DEVI 1 Device by Does not apply route 2 (two) times daily. 07/23/18   [name], MD  spironolactone (ALDACTONE) 25 MG tablet TAKE 1/2 TABLET BY MOUTH ONCE DAILY 01/18/18   Plonk, Gwyndolyn Saxon, MD  tamsulosin (FLOMAX) 0.4 MG CAPS capsule Take 1 capsule (0.4 mg total) by mouth daily after supper. 12/21/17   Plonk, Gwyndolyn Saxon, MD  tiotropium (SPIRIVA HANDIHALER) 18 MCG inhalation capsule Place 1 capsule (18 mcg total) into inhaler and inhale daily for 30 doses. 07/12/18 08/11/18  Cammie Sickle, MD  torsemide (DEMADEX) 20 MG tablet Take 10 mg by mouth daily.  08/14/16   [provider]  Vitamin D, Cholecalciferol, 1000 units CAPS Take 2 capsules by mouth daily. 09/17/17   [name], MD    Allergies Patient has no known allergies.  Family History  Problem Relation Age of Onset  . Diabetes Mother   . CAD Mother   . Diabetes Father   . CAD Father     Social History Social History   Tobacco Use  . Smoking status: Former Smoker    Packs/day: 2.00    Years: 42.00    Pack years: 84.00    Types: Cigarettes    Last attempt to quit: 1991    Years since quitting: 28.8  . Smokeless tobacco: Current User    Types: Chew  . Tobacco comment: smoking cessation materials not required    Substance Use Topics  . Alcohol use: Yes    Comment: 6 beers per month  . Drug use: No    Review of Systems  Constitutional: No fever/chills Eyes: No visual changes. ENT: No sore throat. Cardiovascular: Denies chest pain. Respiratory:shortness of breath. Gastrointestinal: No abdominal pain.  No nausea, no vomiting.  No diarrhea.  No constipation. Genitourinary: Negative for dysuria. Musculoskeletal: Negative for back pain. Skin: Negative for rash. Neurological: Negative for headaches, focal weakness  ____________________________________________   PHYSICAL EXAM:  VITAL SIGNS: ED Triage Vitals [08/06/18 1315]  Enc Vitals Group     BP 102/78     Pulse Rate (!) 110     Resp 16     Temp 97.8 F (36.6 C)     Temp Source Oral     SpO2 95 %     Weight 225 lb (102.1 kg)     Height 6\' 1"  (  1.854 m)     Head Circumference      Peak Flow      Pain Score 0     Pain Loc      Pain Edu?      Excl. in [place]?     Constitutional: Alert and oriented. Well appearing and in no acute distress. Eyes: Conjunctivae are normal.  Head: Atraumatic. Nose: No congestion/rhinnorhea. Mouth/Throat: Mucous membranes are moist.  Oropharynx non-erythematous. Neck: No stridor.  Cardiovascular: Normal rate, regular rhythm. Grossly normal heart sounds.  Good peripheral circulation. Respiratory: Normal respiratory effort.  No retractions. Lungs CTAB. Gastrointestinal: Soft and nontender. No distention. No abdominal bruits. No CVA tenderness. Musculoskeletal: No lower extremity tenderness 2+ edema.  Neurologic:  Normal speech and language. No gross focal neurologic deficits are appreciated. No gait instability. Skin:  Skin is warm, dry and intact. No rash noted. Psychiatric: Mood and affect are normal. Speech and behavior are normal.  ____________________________________________   LABS (all labs ordered are listed, but only abnormal results are displayed)  Labs Reviewed  COMPREHENSIVE METABOLIC  PANEL - Abnormal; Notable for the following components:      Result Value   Sodium 131 (*)    Chloride 92 (*)    Glucose, Bld 106 (*)    Creatinine, Ser 1.36 (*)    Calcium 8.4 (*)    Total Protein 6.1 (*)    Total Bilirubin 1.9 (*)    GFR calc non Af Amer 49 (*)    GFR calc Af Amer 56 (*)    All other components within normal limits  BRAIN NATRIURETIC PEPTIDE - Abnormal; Notable for the following components:   B Natriuretic Peptide 803.0 (*)    All other components within normal limits  CBC WITH DIFFERENTIAL/PLATELET - Abnormal; Notable for the following components:   WBC 11.8 (*)    RDW 16.0 (*)    Platelets 115 (*)    Neutro Abs 9.1 (*)    Abs Immature Granulocytes 0.11 (*)    All other components within normal limits  FIBRIN DERIVATIVES D-DIMER ([facility] ONLY) - Abnormal; Notable for the following components:   Fibrin derivatives D-dimer ([facility]) 980.90 (*)    All other components within normal limits  TROPONIN I  URINALYSIS, COMPLETE (UACMP) WITH MICROSCOPIC   ____________________________________________  EKG  EKG read and interpreted by me shows A. fib at a rate of 98 there are several PVCs present nonspecific ST-T wave changes incomplete bundle branch block ____________________________________________  RADIOLOGY  ED MD interpretation: CT read by radiology shows no pulmonary emboli.  There is mild basilar persistent infiltrates.  Official radiology report(s): Ct Angio Chest Pe W And/or Wo Contrast  Result Date: 08/06/2018 CLINICAL DATA:  Shortness of breath.  History of lymphoma. EXAM: CT ANGIOGRAPHY CHEST WITH CONTRAST TECHNIQUE: Multidetector CT imaging of the chest was performed using the standard protocol during bolus administration of intravenous contrast. Multiplanar CT image reconstructions and MIPs were obtained to evaluate the vascular anatomy. CONTRAST:  13mL ISOVUE-370 IOPAMIDOL (ISOVUE-370) INJECTION 76% COMPARISON:  CT scan of July 15, 2018. FINDINGS:  Cardiovascular: Satisfactory opacification of the pulmonary arteries to the segmental level. No evidence of pulmonary embolism. Mild cardiomegaly. No pericardial effusion. Coronary artery calcifications are noted. Mediastinum/Nodes: Thyroid gland and esophagus are unremarkable. 1.6 cm left subcarinal adenopathy is noted. 2.4 cm precarinal adenopathy is noted which is stable compared to prior exam. Stable adenopathy is noted in aortopulmonary window as well as in anterior mediastinum. Stable right paratracheal adenopathy is noted. Stable  right axillary adenopathy is noted. Lungs/Pleura: No pneumothorax or pleural effusion is noted. Stable bibasilar opacities are noted, left greater than right, consistent with pneumonia. Upper Abdomen: Stable splenomegaly. Musculoskeletal: Stable old right posterior rib fractures are noted. No acute abnormality is noted. Review of the MIP images confirms the above findings. IMPRESSION: No definite evidence of pulmonary embolus. Stable mediastinal and right axillary adenopathy is noted consistent with history of lymphoma. Stable splenomegaly is noted as well. Stable bibasilar opacities are noted, left greater than right, most consistent with pneumonia, although underlying malignancy cannot be excluded. Continued CT follow-up is recommended to ensure resolution. Stable old right posterior rib fractures. Aortic Atherosclerosis (ICD10-I70.0). Electronically Signed   By: Marijo Conception, M.D.   On: 08/06/2018 15:18    ____________________________________________   PROCEDURES  Procedure(s) performed:   Procedures  Critical Care performed:   ____________________________________________   INITIAL IMPRESSION / ASSESSMENT AND PLAN / ED COURSE  Patient's d-dimer is up with CT shows no PE.  He has persistent by basilar infiltrates which are unchanged.  His BNP is elevated.  It looks like he has congestive failure based on his physical exam as well.  We will plan on admitting  him trying some more diuresis and nebs.  We will see if we can hold off on antibiotics for the time being.      ____________________________________________   FINAL CLINICAL IMPRESSION(S) / ED DIAGNOSES  Final diagnoses:  Systolic congestive heart failure, unspecified HF chronicity ([place])  Shortness of breath  Bronchitis     ED Discharge Orders    None       Note:  This document was prepared using Dragon voice recognition software and may include unintentional dictation errors.    Nena Polio, MD 08/06/18 [phone]

## 2018-08-06 NOTE — ED Triage Notes (Signed)
Patient to ED from home via [facility] c/o SOB. Per EMS pt wAS dx with pneumonia in September and has had a lingering cough that has not improved. Pt has hx of CCL CA and presents today A&Ox4, denies pain, and denies NVD.

## 2018-08-06 NOTE — H&P (Signed)
[facility] [place] NAME: Clifford Herrera    MR#:  [id number]  DATE OF BIRTH:  [birth year]  DATE OF ADMISSION:  08/06/2018  PRIMARY CARE PHYSICIAN: [name], MD   REQUESTING/REFERRING PHYSICIAN: Dr. Rip Harbour  CHIEF COMPLAINT:   Chief Complaint  Patient presents with  . Shortness of Breath    HISTORY OF PRESENT ILLNESS:  Clifford Herrera  is a 77 y.o. male with a known history of nonischemic cardiomyopathy, chronic systolic CHF ejection fraction 25%, paroxysmal atrial fibrillation, COPD, CLL presents to the hospital due to shortness of breath and cough.  Patient has had these symptoms over the past few weeks.  Has received Levaquin followed by Z-Pak along with nebulizer treatment.  Recently saw her primary care physician and pulmonologist Dr. [name].  Presently symptoms seem worse and was brought to the emergency room.  Here CT scan of the chest was checked which shows bilateral basal densities which are unchanged from many weeks.  Some pulmonary edema.  He also has lower committee edema which has worsened over the past few days.  Torsemide was increased from 10 mg daily to 20 mg twice daily by primary care physician. Patient is being admitted under observation.  PAST MEDICAL HISTORY:   Past Medical History:  Diagnosis Date  . Chronic systolic CHF (congestive heart failure) (HCC)    25%  . CLL (chronic lymphocytic leukemia) ([place])   . COPD (chronic obstructive pulmonary disease) ([place])   . CPAP (continuous positive airway pressure) dependence   . Heart failure ([place])   . HOH (hard of hearing)   . Presence of automatic (implantable) cardiac defibrillator   . Sleep apnea   . Upper respiratory infection    chronic    PAST SURGICAL HISTORY:   Past Surgical History:  Procedure Laterality Date  . CARDIAC DEFIBRILLATOR PLACEMENT  2015    SOCIAL HISTORY:   Social History   Tobacco Use  . Smoking status: Former Smoker    Packs/day:  2.00    Years: 42.00    Pack years: 84.00    Types: Cigarettes    Last attempt to quit: 1991    Years since quitting: 28.8  . Smokeless tobacco: Current User    Types: Chew  . Tobacco comment: smoking cessation materials not required  Substance Use Topics  . Alcohol use: Yes    Comment: 6 beers per month    FAMILY HISTORY:   Family History  Problem Relation Age of Onset  . Diabetes Mother   . CAD Mother   . Diabetes Father   . CAD Father     DRUG ALLERGIES:  No Known Allergies  REVIEW OF SYSTEMS:   Review of Systems  Constitutional: Positive for malaise/fatigue. Negative for chills and fever.  HENT: Negative for sore throat.   Eyes: Negative for blurred vision, double vision and pain.  Respiratory: Positive for cough, sputum production and shortness of breath. Negative for hemoptysis and wheezing.   Cardiovascular: Positive for leg swelling. Negative for chest pain, palpitations and orthopnea.  Gastrointestinal: Negative for abdominal pain, constipation, diarrhea, heartburn, nausea and vomiting.  Genitourinary: Negative for dysuria and hematuria.  Musculoskeletal: Negative for back pain and joint pain.  Skin: Negative for rash.  Neurological: Negative for sensory change, speech change, focal weakness and headaches.  Endo/Heme/Allergies: Does not bruise/bleed easily.  Psychiatric/Behavioral: Negative for depression. The patient is not nervous/anxious.     MEDICATIONS AT HOME:   Prior to Admission medications  Medication Sig Start Date End Date Taking? Authorizing Provider  acyclovir (ZOVIRAX) 400 MG tablet TAKE 1 TABLET BY MOUTH ONCE DAILY TO PREVENT SHINGLES 07/19/18   Cammie Sickle, MD  albuterol (PROVENTIL HFA;VENTOLIN HFA) 108 (90 Base) MCG/ACT inhaler Inhale 2 puffs into the lungs every 6 (six) hours as needed for wheezing or shortness of breath. 07/12/18   Cammie Sickle, MD  allopurinol (ZYLOPRIM) 100 MG tablet TAKE 1 TABLET BY MOUTH ONCE EVERY  EVENING 07/15/18   [name], MD  apixaban (ELIQUIS) 5 MG TABS tablet TAKE 1 TABLET EVERY 12 HOURS 05/27/17   [provider]  chlorpheniramine-HYDROcodone (TUSSIONEX) 10-8 MG/5ML SUER Take 5 mLs by mouth at bedtime as needed for cough. 07/29/18   Cammie Sickle, MD  dexamethasone (DECADRON) 4 MG tablet Take 1 tablet (4 mg total) by mouth daily. Take 1 tablet starting 2 days prior to infusion. Do not take on day of infusion 04/06/18   Cammie Sickle, MD  Dextromethorphan Polistirex (ROBITUSSIN 12 HOUR COUGH PO) Take by mouth.    [provider]  guaiFENesin (MUCINEX) 600 MG 12 hr tablet Take by mouth 2 (two) times daily.    [provider]  levothyroxine (SYNTHROID, LEVOTHROID) 75 MCG tablet TAKE 1 TABLET BY MOUTH ONCE DAILY BEFOREBREAKFAST. 01/18/18   Plonk, Gwyndolyn Saxon, MD  meclizine (ANTIVERT) 25 MG tablet Take 1 tablet (25 mg total) by mouth 2 (two) times daily as needed for dizziness. 07/01/18   Verlon Au, NP  metoprolol tartrate (LOPRESSOR) 25 MG tablet Take 37.5 mg by mouth 2 (two) times daily.    [provider]  montelukast (SINGULAIR) 10 MG tablet TAKE 1 TABLET BY MOUTH AT BEDTIME START 2 DAYS PRIOR TO INFUSION FOR 4 DAYS 07/19/18   Cammie Sickle, MD  Respiratory Therapy Supplies (FLUTTER) DEVI 1 Device by Does not apply route 4 (four) times daily. 07/23/18   [name], MD  rOPINIRole (REQUIP) 0.25 MG tablet TAKE 2 TABLETS BY MOUTH AT BEDTIME 07/15/18   Cammie Sickle, MD  Spacer/Aero-Holding Chambers DEVI 1 Device by Does not apply route 2 (two) times daily. 07/23/18   [name], MD  spironolactone (ALDACTONE) 25 MG tablet TAKE 1/2 TABLET BY MOUTH ONCE DAILY 01/18/18   Plonk, Gwyndolyn Saxon, MD  tamsulosin (FLOMAX) 0.4 MG CAPS capsule Take 1 capsule (0.4 mg total) by mouth daily after supper. 12/21/17   Plonk, Gwyndolyn Saxon, MD  tiotropium (SPIRIVA HANDIHALER) 18 MCG inhalation capsule Place 1 capsule (18 mcg total)  into inhaler and inhale daily for 30 doses. 07/12/18 08/11/18  Cammie Sickle, MD  torsemide (DEMADEX) 20 MG tablet Take 10 mg by mouth daily.  08/14/16   [provider]  Vitamin D, Cholecalciferol, 1000 units CAPS Take 2 capsules by mouth daily. 09/17/17   Plonk, Gwyndolyn Saxon, MD     VITAL SIGNS:  Blood pressure 97/64, pulse (!) 104, temperature 97.8 F (36.6 C), temperature source Oral, resp. rate 19, height 6\' 1"  (1.854 m), weight 102.1 kg, SpO2 97 %.  PHYSICAL EXAMINATION:  Physical Exam  GENERAL:  77 y.o.-year-old patient lying in the bed with no acute distress.  EYES: Pupils equal, round, reactive to light and accommodation. No scleral icterus. Extraocular muscles intact.  HEENT: Head atraumatic, normocephalic. Oropharynx and nasopharynx clear. No oropharyngeal erythema, moist oral mucosa  NECK:  Supple, no jugular venous distention. No thyroid enlargement, no tenderness.  LUNGS: Bilateral wheezing CARDIOVASCULAR: S1, S2 normal. No murmurs, rubs, or gallops.  ABDOMEN:  Soft, nontender, nondistended. Bowel sounds present. No organomegaly or mass.  EXTREMITIES: No cyanosis, or clubbing. + 2 pedal & radial pulses b/l.  Bilateral lower extremity edema NEUROLOGIC: Cranial nerves II through XII are intact. No focal Motor or sensory deficits appreciated b/l PSYCHIATRIC: The patient is alert and oriented x 3. Good affect.  SKIN: No obvious rash, lesion, or ulcer.   LABORATORY PANEL:   CBC Recent Labs  Lab 08/06/18 1336  WBC 11.8*  HGB 14.1  HCT 43.0  PLT 115*   ------------------------------------------------------------------------------------------------------------------  Chemistries  Recent Labs  Lab 08/06/18 1336  NA 131*  K 4.3  CL 92*  CO2 28  GLUCOSE 106*  BUN 18  CREATININE 1.36*  CALCIUM 8.4*  AST 18  ALT 11  ALKPHOS 55  BILITOT 1.9*    ------------------------------------------------------------------------------------------------------------------  Cardiac Enzymes Recent Labs  Lab 08/06/18 1336  TROPONINI <0.03   ------------------------------------------------------------------------------------------------------------------  RADIOLOGY:  Ct Angio Chest Pe W And/or Wo Contrast  Result Date: 08/06/2018 CLINICAL DATA:  Shortness of breath.  History of lymphoma. EXAM: CT ANGIOGRAPHY CHEST WITH CONTRAST TECHNIQUE: Multidetector CT imaging of the chest was performed using the standard protocol during bolus administration of intravenous contrast. Multiplanar CT image reconstructions and MIPs were obtained to evaluate the vascular anatomy. CONTRAST:  17mL ISOVUE-370 IOPAMIDOL (ISOVUE-370) INJECTION 76% COMPARISON:  CT scan of July 15, 2018. FINDINGS: Cardiovascular: Satisfactory opacification of the pulmonary arteries to the segmental level. No evidence of pulmonary embolism. Mild cardiomegaly. No pericardial effusion. Coronary artery calcifications are noted. Mediastinum/Nodes: Thyroid gland and esophagus are unremarkable. 1.6 cm left subcarinal adenopathy is noted. 2.4 cm precarinal adenopathy is noted which is stable compared to prior exam. Stable adenopathy is noted in aortopulmonary window as well as in anterior mediastinum. Stable right paratracheal adenopathy is noted. Stable right axillary adenopathy is noted. Lungs/Pleura: No pneumothorax or pleural effusion is noted. Stable bibasilar opacities are noted, left greater than right, consistent with pneumonia. Upper Abdomen: Stable splenomegaly. Musculoskeletal: Stable old right posterior rib fractures are noted. No acute abnormality is noted. Review of the MIP images confirms the above findings. IMPRESSION: No definite evidence of pulmonary embolus. Stable mediastinal and right axillary adenopathy is noted consistent with history of lymphoma. Stable splenomegaly is noted as  well. Stable bibasilar opacities are noted, left greater than right, most consistent with pneumonia, although underlying malignancy cannot be excluded. Continued CT follow-up is recommended to ensure resolution. Stable old right posterior rib fractures. Aortic Atherosclerosis (ICD10-I70.0). Electronically Signed   By: Marijo Conception, M.D.   On: 08/06/2018 15:18     IMPRESSION AND PLAN:   *Acute on chronic systolic congestive heart failure, mild.  Has pulmonary edema on CT scan of the chest along with lower committee edema which is worsening.  Low normal blood pressure.  We will continue oral torsemide but increase dose from 20 mg to 60 mg twice daily.  Monitor input and output.  Replace potassium as needed.  Repeat BMP in the morning.  *Bronchitis.  Likely postinfectious after treatment of pneumonia.  No need for antibiotics.  Will start prednisone and scheduled nebulizers.  *CLL.  Follows at the cancer center.  *Paroxysmal atrial fibrillation.  Continue rate control medications from home.  *DVT prophylaxis.  On anticoagulation.  All the records are reviewed and case discussed with ED provider. Management plans discussed with the patient, family and they are in agreement.  CODE STATUS: DNR  TOTAL TIME TAKING CARE OF THIS PATIENT: 40 minutes.   Celisa Herrera R Azile Minardi  M.D on 08/06/2018 at 4:23 PM  Between 7am to 6pm - Pager - [phone]  After 6pm go to [email or web address] - password [email or web address] [facility]  [facility] [place] [facility]  Office  [phone]  CC: Primary care physician; [name], MD  Note: This dictation was prepared with Dragon dictation along with smaller phrase technology. Any transcriptional errors that result from this process are unintentional.

## 2018-08-07 ENCOUNTER — Observation Stay
Admit: 2018-08-07 | Discharge: 2018-08-07 | Disposition: A | Discharge status: Home / Self Care | Payer: Medicare Other | Attending: Internal Medicine | Admitting: Internal Medicine

## 2018-08-07 DIAGNOSIS — I340 Nonrheumatic mitral (valve) insufficiency: Secondary | ICD-10-CM | POA: Diagnosis not present

## 2018-08-07 LAB — BASIC METABOLIC PANEL
ANION GAP: 12 (ref 5–15)
Anion gap: 11 (ref 5–15)
BUN: 19 mg/dL (ref 8–23)
BUN: 19 mg/dL (ref 8–23)
CHLORIDE: 94 mmol/L — AB (ref 98–111)
CHLORIDE: 92 mmol/L — AB (ref 98–111)
CO2: 28 mmol/L (ref 22–32)
CO2: 29 mmol/L (ref 22–32)
Calcium: 8.1 mg/dL — ABNORMAL LOW (ref 8.9–10.3)
Calcium: 8.2 mg/dL — ABNORMAL LOW (ref 8.9–10.3)
Creatinine, Ser: 1.27 mg/dL — ABNORMAL HIGH (ref 0.61–1.24)
Creatinine, Ser: 1.24 mg/dL (ref 0.61–1.24)
GFR, EST NON AFRICAN AMERICAN: 53 mL/min — AB (ref >60)
GFR, EST NON AFRICAN AMERICAN: 54 mL/min — AB (ref >60)
Glucose, Bld: 172 mg/dL — ABNORMAL HIGH (ref 70–99)
Glucose, Bld: 170 mg/dL — ABNORMAL HIGH (ref 70–99)
POTASSIUM: 3.6 mmol/L (ref 3.5–5.1)
POTASSIUM: 3.4 mmol/L — AB (ref 3.5–5.1)
SODIUM: 134 mmol/L — AB (ref 135–145)
SODIUM: 132 mmol/L — AB (ref 135–145)

## 2018-08-07 MED ORDER — PREDNISONE 20 MG PO TABS
40.0000 mg | ORAL_TABLET | Freq: Every day | ORAL | Status: DC
  Administered 2018-08-08: 40 mg via ORAL
  Filled 2018-08-07: qty 2

## 2018-08-07 MED ORDER — GUAIFENESIN-DM 100-10 MG/5ML PO SYRP
5.0000 mL | ORAL_SOLUTION | ORAL | Status: DC | PRN
  Administered 2018-08-08: 5 mL via ORAL
  Filled 2018-08-07 (×4): qty 5

## 2018-08-07 NOTE — Care Management Obs Status (Signed)
[facility] NOTIFICATION   Patient Details  Name: SRIHAN BRUTUS MRN: [id number] Date of Birth: 02-05-1941   Medicare Observation Status Notification Given:  Yes    Faraz Ponciano A Darvis Croft, RN 08/07/2018, 10:13 AM

## 2018-08-07 NOTE — Progress Notes (Signed)
[facility] at [place] NAME: Clifford Herrera    MR#:  [id number]  DATE OF BIRTH:  May 05, 1941  SUBJECTIVE:  CHIEF COMPLAINT:   Chief Complaint  Patient presents with  . Shortness of Breath  Patient admitted for CHF exacerbation, started on high-dose torsemide, feels better today but still has coughing spells.  Daughter mentions that patient has been treated with multiple antibiotics recently for pneumonia by Dr. [name].  Has history of CLL, received antibiotic therapy which caused him to have thrombocytopenia, patient torsemide has been held temporarily by PCP which restarted but patient developed worsening edema, leg swelling and came to the hospital because of worsening shortness of breath, edema.  On a high-dose torsemide now, leg edema improved, patient is off oxygen, sitting in chair and breathing comfortably. has cough, tan-colored sputum. REVIEW OF SYSTEMS:    Review of Systems  Constitutional: Negative for chills and fever.  HENT: Negative for hearing loss.   Eyes: Negative for blurred vision, double vision and photophobia.  Respiratory: Positive for cough and sputum production. Negative for hemoptysis and shortness of breath.   Cardiovascular: Negative for palpitations, orthopnea and leg swelling.  Gastrointestinal: Negative for abdominal pain, diarrhea and vomiting.  Genitourinary: Negative for dysuria and urgency.  Musculoskeletal: Negative for myalgias and neck pain.  Skin: Negative for rash.  Neurological: Negative for dizziness, focal weakness, seizures, weakness and headaches.  Psychiatric/Behavioral: Negative for memory loss. The patient does not have insomnia.     Nutrition:  Tolerating Diet: Tolerating PT:      DRUG ALLERGIES:  No Known Allergies  VITALS:  Blood pressure 96/68, pulse 84, temperature 98.7 F (37.1 C), resp. rate 17, height 6\' 1"  (1.854 m), weight 102.1 kg, SpO2 96 %.  PHYSICAL EXAMINATION:    Physical Exam  GENERAL:  77 y.o.-year-old patient lying in the bed with no acute distress.  EYES: Pupils equal, round, reactive to light and accommodation. No scleral icterus. Extraocular muscles intact.  HEENT: Head atraumatic, normocephalic. Oropharynx and nasopharynx clear.  NECK:  Supple, no jugular venous distention. No thyroid enlargement, no tenderness.  LUNGS: Faint wheezing bilaterally.   CARDIOVASCULAR: S1, S2 normal. No murmurs, rubs, or gallops.  ABDOMEN: Soft, nontender, nondistended. Bowel sounds present. No organomegaly or mass.  EXTREMITIES: Trace pedal edema.Marland Kitchen  NEUROLOGIC: Cranial nerves II through XII are intact. Muscle strength 5/5 in all extremities. Sensation intact. Gait not checked.  PSYCHIATRIC: The patient is alert and oriented x 3.  SKIN: No obvious rash, lesion, or ulcer.    LABORATORY PANEL:   CBC Recent Labs  Lab 08/06/18 1336  WBC 11.8*  HGB 14.1  HCT 43.0  PLT 115*   ------------------------------------------------------------------------------------------------------------------  Chemistries  Recent Labs  Lab 08/06/18 1336 08/07/18 0525  NA 131* 134*  K 4.3 3.6  CL 92* 94*  CO2 28 28  GLUCOSE 106* 172*  BUN 18 19  CREATININE 1.36* 1.27*  CALCIUM 8.4* 8.1*  AST 18  --   ALT 11  --   ALKPHOS 55  --   BILITOT 1.9*  --    ------------------------------------------------------------------------------------------------------------------  Cardiac Enzymes Recent Labs  Lab 08/06/18 1336  TROPONINI <0.03   ------------------------------------------------------------------------------------------------------------------  RADIOLOGY:  Ct Angio Chest Pe W And/or Wo Contrast  Result Date: 08/06/2018 CLINICAL DATA:  Shortness of breath.  History of lymphoma. EXAM: CT ANGIOGRAPHY CHEST WITH CONTRAST TECHNIQUE: Multidetector CT imaging of the chest was performed using the standard protocol during bolus administration of intravenous  contrast.  Multiplanar CT image reconstructions and MIPs were obtained to evaluate the vascular anatomy. CONTRAST:  37mL ISOVUE-370 IOPAMIDOL (ISOVUE-370) INJECTION 76% COMPARISON:  CT scan of July 15, 2018. FINDINGS: Cardiovascular: Satisfactory opacification of the pulmonary arteries to the segmental level. No evidence of pulmonary embolism. Mild cardiomegaly. No pericardial effusion. Coronary artery calcifications are noted. Mediastinum/Nodes: Thyroid gland and esophagus are unremarkable. 1.6 cm left subcarinal adenopathy is noted. 2.4 cm precarinal adenopathy is noted which is stable compared to prior exam. Stable adenopathy is noted in aortopulmonary window as well as in anterior mediastinum. Stable right paratracheal adenopathy is noted. Stable right axillary adenopathy is noted. Lungs/Pleura: No pneumothorax or pleural effusion is noted. Stable bibasilar opacities are noted, left greater than right, consistent with pneumonia. Upper Abdomen: Stable splenomegaly. Musculoskeletal: Stable old right posterior rib fractures are noted. No acute abnormality is noted. Review of the MIP images confirms the above findings. IMPRESSION: No definite evidence of pulmonary embolus. Stable mediastinal and right axillary adenopathy is noted consistent with history of lymphoma. Stable splenomegaly is noted as well. Stable bibasilar opacities are noted, left greater than right, most consistent with pneumonia, although underlying malignancy cannot be excluded. Continued CT follow-up is recommended to ensure resolution. Stable old right posterior rib fractures. Aortic Atherosclerosis (ICD10-I70.0). Electronically Signed   By: Marijo Conception, M.D.   On: 08/06/2018 15:18     ASSESSMENT AND PLAN:   Active Problems:   SOB (shortness of breath)   Acute on chronic systolic heart failure, improved with high-dose torsemide, check daily weights, monitor renal parameters, continue high-dose torsemide today, decrease dose of  torsemide tomorrow.  Continue low-salt diet. 2.  History of CLL,, chronic dyspnea, no productive phlegm, recently treated by Dr. [name] outpatient with Z-Pak, Levaquin, was given flutter valve for airway secretions.  We will order flutter valve.  Was given Symbicort recently by pulmonologist.   we will request pulmonary consult while he is here.  Hold off on further antibiotics.  Discussed with patient, patient's daughter. 3.  History of CLL, follows up with Dr. [name], patient had massive splenomegaly, mediastinal, periportal adenopathy which is stable, received antibiotic therapy.  #4 history of chronic A. fib, Eliquis on hold recently for a brief time because of thrombocytopenia but now started on Eliquis back, thrombocytopenia improved.,  Platelets stable around 115 .  Chronic infection prophylaxis with acyclovir.  Continue that.  Discussed with patient's daughter. All the records are reviewed and case discussed with Care Management/Social Workerr. Management plans discussed with the patient, family and they are in agreement.  CODE STATUS: DNR  TOTAL TIME TAKING CARE OF THIS PATIENT: 84minutes.   More than 50% time spent in counseling, coordination of care  POSSIBLE D/C IN 1-2DAYS, DEPENDING ON CLINICAL CONDITION.   Epifanio Lesches M.D on 08/07/2018 at 10:06 AM  Between 7am to 6pm - Pager - [phone]  After 6pm go to [email or web address] - password [email or web address] [facility]  [place] [facility]  Office  [phone]  CC: Primary care physician; [name], MD

## 2018-08-08 MED ORDER — IPRATROPIUM-ALBUTEROL 0.5-2.5 (3) MG/3ML IN SOLN
3.0000 mL | Freq: Two times a day (BID) | RESPIRATORY_TRACT | Status: DC
  Filled 2018-08-08: qty 3

## 2018-08-08 MED ORDER — TORSEMIDE 20 MG PO TABS: 20.0000 mg | ORAL_TABLET | Freq: Every day | ORAL | 0 refills | Status: AC

## 2018-08-08 NOTE — Plan of Care (Signed)

## 2018-08-09 ENCOUNTER — Encounter: Payer: Self-pay | Admitting: Internal Medicine

## 2018-08-09 ENCOUNTER — Telehealth: Payer: Self-pay | Admitting: Pulmonary Disease

## 2018-08-09 NOTE — Telephone Encounter (Signed)
Spoke to patient's daughter, per Dr. [name], I have cancelled CT since he just had it in the hospital. Daughter aware and will be at apt on Friday.

## 2018-08-09 NOTE — Telephone Encounter (Signed)
Patient daughter Nira Conn calling Patient was just recently discharged from the hospital A CT was done while in hospital, would like to know if the CT scheduled for 11/13 is still necessary Patient was already scheduled for an appointment with Dr. [name] on 11/15, would also like to know if this follow up should be sooner Please call to discuss

## 2018-08-09 NOTE — Telephone Encounter (Signed)
Patient daughter Clifford Herrera would like the return call to avoid patient confusion

## 2018-08-11 ENCOUNTER — Encounter: Payer: Self-pay | Admitting: Pulmonary Disease

## 2018-08-11 ENCOUNTER — Ambulatory Visit: Payer: Medicare Other

## 2018-08-11 ENCOUNTER — Ambulatory Visit (INDEPENDENT_AMBULATORY_CARE_PROVIDER_SITE_OTHER): Payer: Medicare Other | Admitting: Pulmonary Disease

## 2018-08-11 VITALS — BP 90/60 | HR 89 | Ht 73.0 in | Wt 201.6 lb

## 2018-08-11 DIAGNOSIS — I428 Other cardiomyopathies: Secondary | ICD-10-CM | POA: Diagnosis not present

## 2018-08-11 DIAGNOSIS — J4489 Other specified chronic obstructive pulmonary disease: Secondary | ICD-10-CM

## 2018-08-11 DIAGNOSIS — R918 Other nonspecific abnormal finding of lung field: Secondary | ICD-10-CM

## 2018-08-11 DIAGNOSIS — C9110 Chronic lymphocytic leukemia of B-cell type not having achieved remission: Secondary | ICD-10-CM

## 2018-08-11 DIAGNOSIS — J449 Chronic obstructive pulmonary disease, unspecified: Secondary | ICD-10-CM | POA: Diagnosis not present

## 2018-08-11 MED ORDER — ARFORMOTEROL TARTRATE 15 MCG/2ML IN NEBU: 15.0000 ug | INHALATION_SOLUTION | Freq: Two times a day (BID) | RESPIRATORY_TRACT | 2 refills | Status: DC

## 2018-08-11 MED ORDER — ARFORMOTEROL TARTRATE 15 MCG/2ML IN NEBU: 15.0000 ug | INHALATION_SOLUTION | Freq: Two times a day (BID) | RESPIRATORY_TRACT | 0 refills | Status: DC

## 2018-08-11 MED ORDER — BUDESONIDE 0.25 MG/2ML IN SUSP: 0.2500 mg | Freq: Two times a day (BID) | RESPIRATORY_TRACT | 12 refills | Status: DC

## 2018-08-11 NOTE — Patient Instructions (Signed)
1) Will start Brovana/budesonide (breathing solutions) twice a day via nebulizer.  2) Will order a PET/CT to evaluate the abnormalities in your lung.  3) Will place a referral to palliative care to see if they can help you with some home health needs.  4) Will notify Dr. Alveria Apley office of need for appointments so that he can adjust your heart medications.  5) Follow-up will be in 2 to 3 weeks time, do call us before then if any new issues arise.

## 2018-08-11 NOTE — Progress Notes (Signed)
Subjective:    Patient ID: Clifford Herrera, male    DOB: August 22, 1941, 77 y.o.   MRN: [id number]  HPI The patient is a 77 year old former smoker (1991) with a history of CLL who follows up with regards to an abnormal CT scan of the chest and dyspnea after a bout of pneumonia. The patient was initially seen on 25 October. The patient started treatment for CLL with [facility] on 29 July. She had the second dose on 30 July and a third dose on 16 September. After that those the patient experienced issues with cough productive of purulent sputum and generalized malaise. He presented for a checkup and was treated with antibiotics. Subsequently he had a chest x-ray on 29 September that showed a dense retro cardiac infiltrate. He continued with antibiotics. Since then he has continued to feel generalized malaise and dyspnea particularly on exertion. His cough is productive now of white thick sputum that is hard for him to expectorate. He had a CT scan of the chest on 17 October that showed persistence of the infiltrate. However the changes appear to be inflammatory and/or infectious. There appear to be some mucus plugging in the distal bronchi. Since his initial presentation with this illness the patient has not had any fevers, chills or sweats. He has generalized arthralgias and myalgias. He has noted some orthopnea and lower extremity edema. He has not had paroxysmal nocturnal dyspnea. He does note some tachy palpitations. He has not had any improvement of his symptoms with the provided antibiotics. At his initial visit we treated him with airway clearance device (flutter valve), he was started on Symbicort twice a day, given a short course of prednisone and Azithromycin as anti-inflammatory. The working diagnosis was that of organizing pneumonia and exacerbation of COPD. The plan was to bring the patient back today with a CT scan of the chest for follow-up.  Patient continued to have issues with weakness and dyspnea  that were out of proportion to the findings on CT. He presented to the emergency room on 8 November. A CT scan of the chest with PE protocol was performed and showed no PE. The abnormalities on his lower lobes appeared to be stable according to the radiologist. By my review of these appeared actually to be decreasing in size. There was also an element of congestive failure. He was admitted overnight and was diuresed aggressively. He felt better after that. He is now back to his usual dose of diuretic. He does complain of orthostatic symptoms of dizziness when he stands up. His 2D Echo performed while hospitalized showed that his left ventricular ejection fraction was 20 to 25%. His dyspnea has been better since he increases diuretics. However he notes that his blood pressure has been on the low side. He presents today with his daughter who has a background in nursing. She is concerned because of potential malignancy shown on the CT scan of the chest but is realistic that workup and or treatment may not be feasible if this were to be a malignancy. We did discuss the findings of still favoring organizing pneumonia and inflammatory response as the findings on CT. On the CT scan performed on 17 October the left lower lobe process measured 7.7 cm x 5.4 cm on the CT scan of 8 November perform while the patient was hospitalized, this lesion now measures 5.8 cm x 4.4 cm. The right lower lobe density was 4.5 cm x 3.3 cm on 17 October scan and 3.9 cm x  2.6 cm on 8 November scan. Again this favors organizing pneumonia/inflammation.  The question arose as to whether bronchoscopy would be feasible if this were to be done the patient will need to have navigational bronchoscopy which would require general anesthesia and I do not believe that his current cardiac situation would allow for this. In addition, the patient does have a pacer defibrillator in place and it is uncertain what effect with electromagnetic navigation have on  this device. The patient and his daughter did ask for other diagnostic testing that may help determine diagnosis short of biopsy. They are interested in pursuing a PET/CT to evaluate these abnormalities further.  Further discussion today centered on the need of the patient to have some home health assistance and Palliative Care evaluation. They are interested in pursuing Palliative Care and the patient's daughter understands that he may need to transition to hospice if he continues to decline.  With regards to his dyspnea as noted he has gotten better with diuresis however, he does note that Symbicort was helping him but he has now run out of this medication. He did feel that he could not get the Symbicort deeply into his chest. He did like nebulizers that were given in the hospital as these really alleviated his dyspnea.   Review of Systems  Constitutional: Positive for fatigue and unexpected weight change. Negative for chills, diaphoresis and fever.  HENT: Negative.   Eyes: Negative.   Respiratory: Positive for cough, chest tightness and shortness of breath.   Cardiovascular: Negative.   Gastrointestinal: Negative.   Endocrine: Negative.   Musculoskeletal: Negative.   Allergic/Immunologic: Positive for immunocompromised state.  Neurological: Negative.   Hematological: Bruises/bleeds easily.  Psychiatric/Behavioral: The patient is nervous/anxious.   All other systems reviewed and are negative.      Objective:   Physical Exam  Constitutional: He is oriented to person, place, and time.  Frail, chronically ill appearing gentleman, thin. No acute distress. Presents in a wheelchair.  HENT:  Head: Normocephalic and atraumatic.  Nose: Nose normal.  Mouth/Throat: Oropharynx is clear and moist.  Eyes: Conjunctivae and EOM are normal. No scleral icterus.  Neck: Neck supple. No JVD present. No tracheal deviation present.  Cardiovascular: Normal rate and normal heart sounds. An irregularly  irregular rhythm present. Exam reveals no S3, no S4 and no distant heart sounds.  Pacer/ICD in place  Pulmonary/Chest: Effort normal. No respiratory distress. He has no wheezes. He has rhonchi in the right lower field and the left lower field. He has no rales.  Abdominal: He exhibits no distension.  Musculoskeletal: He exhibits no edema.  Neurological: He is alert and oriented to person, place, and time.  No focal deficits  Skin: Skin is warm and dry. Ecchymosis noted. No cyanosis. Nails show no clubbing.  Psychiatric: His speech is normal and behavior is normal. Thought content normal. He exhibits abnormal recent memory (The patient appears to be forgetful at times).  Nursing note and vitals reviewed.         Assessment & Plan:   1) Abnormal findings on CT chest: patient clearly has abnormalities noted on the lower lobes after a recent bout of pneumonia. This may represent some organizing pneumonia after this event. It is noted however that the lesions are decreasing in size. However because of his history of CLL and his past history of heavy smoking the family is very anxious and would like to at least get an idea of what we may be dealing with. The patient  is not an optimal candidate for navigational bronchoscopy due to significant cardiomyopathy with recent decompensation and pacer/AICD device which could be affected by the electromagnetic navigation qualities of the equipment. They still would like to obtain tissue diagnosis if this were possible. I recommend obtaining a PET/CT and review the results prior to determining whether further biopsy is necessary. The patient and his daughter aware that pursuing further invasive procedures may create more problems for the patient. They accept that PET/CT as at least a noninvasive means of trying to a certain what the process is. Given the changes that I have seen on the two CTs I suspect that we are dealing with a post inflammatory effect after  pneumonia and that this will take some time to completely resolve.  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 I suspect that this was the main component of his issues with dyspnea which are improved but not completely resolved. The patient has issues with blood pressure fluctuations and will likely benefit from a reevaluation by his cardiologist to optimize his CHF regimen.This issue adds complexity to his management. I am not sure how much more he will be able to be optimized with regards to this issue.  3) Chronic Obstructive Pulmonary Disease: by prior spirometry he is at least GOLD class 2 (moderate), he did get some benefit from Symbicort but even with a spacer he was having difficulties dosing the medication. He did well with the nebulizers in the hospital. We will place him on Brovana 15 g twice a day via nebulizer and Pulmicort 0.25 mg twice a day via nebulizer followed by the use of a flutter valve after these interventions to assist him with mucus ciliary clearance and to liver bronchodilator effectively.  4) Chronic Lymphocytic Leukemia/Small Lymphocytic Lymphoma: per medical oncology's note of 14 October the patient is stage IV disease with his CLL/SLL. He received Gazyva with a goal of control/palliation. Patient did not tolerate this treatment well and has had difficulties particularly with pneumonia and it is possible that his recent exacerbation of congestive failure was secondary to cardiac toxicity from this medication.   5) Atrial fibrillation: the patient is now off of anticoagulants due to significant thrombocytopenia. Eliquis was discontinued during his recent hospitalization.  Had end of life discussion today. Patient is already DNR/DNI status. The patient's daughter is interested in getting palliative care involved at this juncture to determine further needs and goals of care for  the patient. Will request evaluation by palliative care. They seem fairly stressed and I will see if Palliative Care can do evaluation within  the next 72 hours.  Total time visit was 45 minutes.

## 2018-08-12 NOTE — Discharge Summary (Signed)
Clifford Herrera, is a 77 y.o. male  DOB 04-29-1941  MRN [id number].  Admission date:  08/06/2018  Admitting Physician  Hillary Bow, MD  Discharge Date:  08/08/2018   Primary MD  [name], MD  Recommendations for primary care physician for things to follow:   Follow with PCP in 1 week Follow-up with Dr. [name] as an outpatient in a week or so to schedule for bronchoscopy, daughter is informed about this.  Admission Diagnosis  Shortness of breath [R06.02] Bronchitis [I14] Chronic systolic CHF (congestive heart failure) (HCC) [E31.54] Systolic congestive heart failure, unspecified HF chronicity (HCC) [I50.20]   Discharge Diagnosis  Shortness of breath [R06.02] Bronchitis [M08] Chronic systolic CHF (congestive heart failure) (HCC) [Q76.19] Systolic congestive heart failure, unspecified HF chronicity (HCC) [I50.20]    Active Problems:   SOB (shortness of breath)      Past Medical History:  Diagnosis Date  . Chronic systolic CHF (congestive heart failure) (HCC)    25%  . CLL (chronic lymphocytic leukemia) ([place])   . COPD (chronic obstructive pulmonary disease) ([place])   . CPAP (continuous positive airway pressure) dependence   . Heart failure ([place])   . HOH (hard of hearing)   . Presence of automatic (implantable) cardiac defibrillator   . Sleep apnea   . Upper respiratory infection    chronic    Past Surgical History:  Procedure Laterality Date  . CARDIAC DEFIBRILLATOR PLACEMENT  2015       History of present illness and  Hospital Course:     Kindly see H&P for history of present illness and admission details, please review complete Labs, Consult reports and Test reports for all details in brief  HPI  from the history and physical done on the day of admission 77 year old male patient with  history of CLL, chronic dyspnea, chronic thrombocytopenia admitted because of shortness of breath found to have acute on chronic systolic heart failure.   Hospital Course  #1. acute on chronic systolic heart failure, patient has history of nonischemic cardiomyopathy, EF 25% came in because of shortness of breath, cough, pedal edema.  Patient torsemide dose has been adjusted by PCP because of this but he was so short of breath and has cough and clear phlegm so admitted for CHF exacerbation, received high-dose torsemide 60 mg IV twice daily, patient shortness of breath, leg edema improved.  He was ambulating in the room without oxygen and without feeling short of breath so wanted to go home.  Patient's daughter to give torsemide 40 mg p.o. twice daily for 3 days followed by home dose 20 mg p.o. twice daily.  Daughter understands this.   2.  Bronchitis, likely postinfectious after pneumonia treatment, patient was following with Dr. [name] as an outpatient, received a course of Levaquin, azithromycin recently.  Did not get any antibiotics here. 3.  Paroxysmal atrial fibrillation, heart rate controlled here, patient is on Eliquis. 4.  History of CLL, patient has chronic dyspnea, was following with Dr. [name] as an outpatient, recently started on flutter valve, Symbicort, patient is advised to follow-up with her and schedule for bronchoscopy. 4.  History of CLL, chronic thrombocytopenia, history of splenomegaly, mediastinal and periportal adenopathy, followed by Dr. [name], patient had recent thrombocytopenia and Eliquis was on hold the but restarted after thrombocytopenia improved.   5.  Chronic infection prophylaxis: Patient is on acyclovir,  continued that here. 6. hypothyroidism: Continue Synthyroid.  Discharge Condition:    Follow UP  Follow-up Information  [name], MD. Call.   Specialty:  Internal Medicine Why:  family can make appointment in one week  Contact  information: [address] [place] [address] [phone]        Cammie Sickle, MD. Schedule an appointment as soon as possible for a visit in 1 week(s).   Specialties:  Internal Medicine, Oncology Contact information: [place] [address] [phone]        [name], MD Follow up.   Specialty:  Pulmonary Disease Why:  needs follow up with her in  3-5 days,possible schedule of bronch as an out pt,daughter needs to make appointment Contact information: [place] [address] [phone]             Discharge Instructions  and  Discharge Medications      Allergies as of 08/08/2018   No Known Allergies     Medication List    TAKE these medications   acetaminophen 500 MG tablet Commonly known as:  TYLENOL Take 1,000 mg by mouth every 6 (six) hours as needed for mild pain, moderate pain, fever or headache.   acyclovir 400 MG tablet Commonly known as:  ZOVIRAX TAKE 1 TABLET BY MOUTH ONCE DAILY TO PREVENT SHINGLES What changed:    how much to take  how to take this  when to take this  additional instructions   albuterol 108 (90 Base) MCG/ACT inhaler Commonly known as:  PROVENTIL HFA;VENTOLIN HFA Inhale 2 puffs into the lungs every 6 (six) hours as needed for wheezing or shortness of breath.   allopurinol 100 MG tablet Commonly known as:  ZYLOPRIM TAKE 1 TABLET BY MOUTH ONCE EVERY EVENING What changed:    how much to take  how to take this  when to take this  additional instructions   ALPRAZolam 0.25 MG tablet Commonly known as:  XANAX Take 0.25 mg by mouth 2 (two) times daily as needed for anxiety.   chlorpheniramine-HYDROcodone 10-8 MG/5ML Suer Commonly known as:  TUSSIONEX Take 5 mLs by mouth at bedtime as needed for cough.   dexamethasone 4 MG tablet Commonly known as:  DECADRON Take 1 tablet (4 mg total) by mouth daily. Take 1 tablet starting 2 days  prior to infusion. Do not take on day of infusion   FLUTTER Devi 1 Device by Does not apply route 4 (four) times daily.   guaiFENesin 600 MG 12 hr tablet Commonly known as:  MUCINEX Take 600 mg by mouth 2 (two) times daily.   levothyroxine 75 MCG tablet Commonly known as:  SYNTHROID, LEVOTHROID TAKE 1 TABLET BY MOUTH ONCE DAILY BEFOREBREAKFAST. What changed:  See the new instructions.   meclizine 25 MG tablet Commonly known as:  ANTIVERT Take 1 tablet (25 mg total) by mouth 2 (two) times daily as needed for dizziness.   metoprolol tartrate 25 MG tablet Commonly known as:  LOPRESSOR Take 25 mg by mouth 2 (two) times daily.   montelukast 10 MG tablet Commonly known as:  SINGULAIR TAKE 1 TABLET BY MOUTH AT BEDTIME START 2 DAYS PRIOR TO INFUSION FOR 4 DAYS   ROBITUSSIN 12 HOUR COUGH PO Take 5 mLs by mouth daily.   rOPINIRole 0.25 MG tablet Commonly known as:  REQUIP TAKE 2 TABLETS BY MOUTH AT BEDTIME   Spacer/Aero-Holding Dorise Bullion 1 Device by Does not apply route 2 (two) times daily.   spironolactone 25 MG tablet Commonly known as:  ALDACTONE TAKE  1/2 TABLET BY MOUTH ONCE DAILY   tamsulosin 0.4 MG Caps capsule Commonly known as:  FLOMAX Take 1 capsule (0.4 mg total) by mouth daily after supper.   tiotropium 18 MCG inhalation capsule Commonly known as:  SPIRIVA Place 1 capsule (18 mcg total) into inhaler and inhale daily for 30 doses.   torsemide 20 MG tablet Commonly known as:  DEMADEX Take 1 tablet (20 mg total) by mouth daily. Take 40 mg po bid for 3days and resume home dose of  20 mg po BID after 3days What changed:    when to take this  additional instructions   Vitamin D (Cholecalciferol) 25 MCG (1000 UT) Caps Take 2 capsules by mouth daily. What changed:  how much to take         Diet and Activity recommendation: See Discharge Instructions above   Consults obtained -none, requested pulmonary consult but patient felt better with torsemide and  wanted to go home and did not want to have pulmonary evaluation in the hospital.   Major procedures and Radiology Reports - PLEASE review detailed and final reports for all details, in brief -      Ct Chest Wo Contrast  Result Date: 07/15/2018 CLINICAL DATA:  History of CLL.  Followup left lung base pneumonia. EXAM: CT CHEST WITHOUT CONTRAST TECHNIQUE: Multidetector CT imaging of the chest was performed following the standard protocol without IV contrast. COMPARISON:  12/30/2017 FINDINGS: Cardiovascular: Moderate cardiac enlargement. Aortic atherosclerosis. Calcifications in the left main, lad, left circumflex and RCA coronary arteries noted. Mediastinum/Nodes: Normal appearance of the thyroid gland. The trachea appears patent and is midline. Me mediastinal adenopathy is again noted. Index right paratracheal lymph node measures 2.2 cm, image 51/2. Previously 2.3 cm. Mild bilateral axillary adenopathy, index right axillary node measures 1.1 cm. Previously 1.3 cm Lungs/Pleura: Mild to moderate changes of emphysema. Small left pleural effusion identified. Large area of masslike architectural distortion involving much of the left lower lobe is identified with multiple areas of distal bronchial mucoid impaction and tree-in-bud nodularity. A similar finding is identified within the posteromedial right lower lobe. Imaging findings are favored to represent sequelae of multifocal pneumonia and/or aspiration. Upper Abdomen: No acute abnormality. Splenomegaly is again noted. Aortic atherosclerosis. Musculoskeletal: Spondylosis within the thoracic spine. Unchanged right 8th, 9th, 10th posttraumatic rib deformities. IMPRESSION: 1. Persistent bilateral lower lobe opacities, left greater than right. Findings are favored to represent either multifocal pneumonia and/or aspiration. Follow-up imaging is recommended to ensure resolution. If these do not resolve then further investigation with bronchoscopy may be considered  in this patient who is at increased risk for lung cancer. 2. Stable appearance of mediastinal and axillary adenopathy compatible with the clinical history of CLL. 3. Aortic Atherosclerosis (ICD10-I70.0) and Emphysema (ICD10-J43.9). 4. Coronary artery atherosclerotic calcifications. Electronically Signed   By: Kerby Moors M.D.   On: 07/15/2018 13:08   Ct Angio Chest Pe W And/or Wo Contrast  Result Date: 08/06/2018 CLINICAL DATA:  Shortness of breath.  History of lymphoma. EXAM: CT ANGIOGRAPHY CHEST WITH CONTRAST TECHNIQUE: Multidetector CT imaging of the chest was performed using the standard protocol during bolus administration of intravenous contrast. Multiplanar CT image reconstructions and MIPs were obtained to evaluate the vascular anatomy. CONTRAST:  34mL ISOVUE-370 IOPAMIDOL (ISOVUE-370) INJECTION 76% COMPARISON:  CT scan of July 15, 2018. FINDINGS: Cardiovascular: Satisfactory opacification of the pulmonary arteries to the segmental level. No evidence of pulmonary embolism. Mild cardiomegaly. No pericardial effusion. Coronary artery calcifications are noted. Mediastinum/Nodes: Thyroid gland  and esophagus are unremarkable. 1.6 cm left subcarinal adenopathy is noted. 2.4 cm precarinal adenopathy is noted which is stable compared to prior exam. Stable adenopathy is noted in aortopulmonary window as well as in anterior mediastinum. Stable right paratracheal adenopathy is noted. Stable right axillary adenopathy is noted. Lungs/Pleura: No pneumothorax or pleural effusion is noted. Stable bibasilar opacities are noted, left greater than right, consistent with pneumonia. Upper Abdomen: Stable splenomegaly. Musculoskeletal: Stable old right posterior rib fractures are noted. No acute abnormality is noted. Review of the MIP images confirms the above findings. IMPRESSION: No definite evidence of pulmonary embolus. Stable mediastinal and right axillary adenopathy is noted consistent with history of lymphoma.  Stable splenomegaly is noted as well. Stable bibasilar opacities are noted, left greater than right, most consistent with pneumonia, although underlying malignancy cannot be excluded. Continued CT follow-up is recommended to ensure resolution. Stable old right posterior rib fractures. Aortic Atherosclerosis (ICD10-I70.0). Electronically Signed   By: Marijo Conception, M.D.   On: 08/06/2018 15:18    Micro Results    No results found for this or any previous visit (from the past 240 hour(s)).     Today   Subjective:   Lavena Bullion today has no headache,no chest abdominal pain,no new weakness tingling or numbness, feels much better wants to go home today.   Objective:   Blood pressure 111/74, pulse 78, temperature 98.3 F (36.8 C), temperature source Oral, resp. rate 16, height 6\' 1"  (1.854 m), weight 102.1 kg, SpO2 98 %.  No intake or output data in the 24 hours ending 08/12/18 1634  Exam Awake Alert, Oriented x 3, No new F.N deficits, Normal affect [place].AT,PERRAL Supple Neck,No JVD, No cervical lymphadenopathy appriciated.  Symmetrical Chest wall movement, Good air movement bilaterally, CTAB RRR,No Gallops,Rubs or new Murmurs, No Parasternal Heave +ve B.Sounds, Abd Soft, Non tender, No organomegaly appriciated, No rebound -guarding or rigidity. No Cyanosis, Clubbing or edema, No new Rash or bruise  Data Review   CBC w Diff:  Lab Results  Component Value Date   WBC 11.8 (H) 08/06/2018   HGB 14.1 08/06/2018   HGB 14.1 08/17/2017   HCT 43.0 08/06/2018   HCT 41.5 08/17/2017   PLT 115 (L) 08/06/2018   PLT 68 (LL) 08/17/2017   LYMPHOPCT 16 08/06/2018   LYMPHOPCT 90.3 01/15/2015   BANDSPCT 0 02/02/2018   MONOPCT 5 08/06/2018   MONOPCT 1.9 01/15/2015   EOSPCT 0 08/06/2018   EOSPCT 0.2 01/15/2015   BASOPCT 0 08/06/2018   BASOPCT 0.1 01/15/2015    CMP:  Lab Results  Component Value Date   NA 132 (L) 08/07/2018   NA 137 12/04/2017   NA 136 07/25/2014   K 3.4 (L)  08/07/2018   K 5.6 (H) 07/25/2014   CL 92 (L) 08/07/2018   CL 101 07/25/2014   CO2 29 08/07/2018   CO2 24 07/25/2014   BUN 19 08/07/2018   BUN 15 12/04/2017   BUN 33 (H) 07/25/2014   CREATININE 1.24 08/07/2018   CREATININE 1.34 (H) 01/15/2015   PROT 6.1 (L) 08/06/2018   PROT 5.8 (L) 08/17/2017   PROT 6.4 07/24/2014   ALBUMIN 3.8 08/06/2018   ALBUMIN 4.3 08/17/2017   ALBUMIN 4.0 07/24/2014   BILITOT 1.9 (H) 08/06/2018   BILITOT 0.4 08/17/2017   BILITOT 1.1 (H) 07/24/2014   ALKPHOS 55 08/06/2018   ALKPHOS 53 07/24/2014   AST 18 08/06/2018   AST 31 07/24/2014   ALT 11 08/06/2018   ALT 23  07/24/2014  .   Total Time in preparing paper work, data evaluation and todays exam - 35 minutes  Epifanio Lesches M.D on 08/08/2018 at 4:34 PM    Note: This dictation was prepared with Dragon dictation along with smaller phrase technology. Any transcriptional errors that result from this process are unintentional.

## 2018-08-13 ENCOUNTER — Ambulatory Visit: Payer: Medicare Other | Admitting: Pulmonary Disease

## 2018-08-14 ENCOUNTER — Other Ambulatory Visit: Payer: Self-pay | Admitting: Internal Medicine

## 2018-08-16 ENCOUNTER — Other Ambulatory Visit: Payer: Self-pay | Admitting: Internal Medicine

## 2018-08-16 ENCOUNTER — Telehealth: Payer: Self-pay | Admitting: *Deleted

## 2018-08-16 NOTE — Telephone Encounter (Signed)
Patient's wife called to request a refill of his Requip and to let us know that he is having a PET Scan tomorrow (ordered by Dr. [name]) and she would like Dr. [name] to look at it so they can get his opinion at patient's next appointment.     dhs

## 2018-08-17 ENCOUNTER — Ambulatory Visit (INDEPENDENT_AMBULATORY_CARE_PROVIDER_SITE_OTHER): Payer: Medicare Other | Admitting: Internal Medicine

## 2018-08-17 ENCOUNTER — Encounter: Payer: Self-pay | Admitting: Internal Medicine

## 2018-08-17 ENCOUNTER — Encounter
Admission: RE | Admit: 2018-08-17 | Discharge: 2018-08-17 | Disposition: A | Discharge status: Home / Self Care | Payer: Medicare Other | Source: Ambulatory Visit | Attending: Pulmonary Disease | Admitting: Pulmonary Disease

## 2018-08-17 VITALS — BP 100/64 | HR 92 | Ht 73.0 in | Wt 198.0 lb

## 2018-08-17 DIAGNOSIS — R918 Other nonspecific abnormal finding of lung field: Secondary | ICD-10-CM

## 2018-08-17 DIAGNOSIS — C8303 Small cell B-cell lymphoma, intra-abdominal lymph nodes: Secondary | ICD-10-CM | POA: Diagnosis not present

## 2018-08-17 DIAGNOSIS — F321 Major depressive disorder, single episode, moderate: Secondary | ICD-10-CM | POA: Diagnosis not present

## 2018-08-17 DIAGNOSIS — F411 Generalized anxiety disorder: Secondary | ICD-10-CM | POA: Diagnosis not present

## 2018-08-17 DIAGNOSIS — Z23 Encounter for immunization: Secondary | ICD-10-CM | POA: Diagnosis not present

## 2018-08-17 LAB — GLUCOSE, CAPILLARY: GLUCOSE-CAPILLARY: 98 mg/dL (ref 70–99)

## 2018-08-17 MED ORDER — SERTRALINE HCL 50 MG PO TABS: 50.0000 mg | ORAL_TABLET | Freq: Every day | ORAL | 1 refills | Status: DC

## 2018-08-17 MED ORDER — ALPRAZOLAM 0.25 MG PO TABS: 0.2500 mg | ORAL_TABLET | Freq: Two times a day (BID) | ORAL | 1 refills | Status: AC | PRN

## 2018-08-17 MED ORDER — FLUDEOXYGLUCOSE F - 18 (FDG) INJECTION
10.4000 | Freq: Once | INTRAVENOUS | Status: AC | PRN
  Administered 2018-08-17: 11.02 via INTRAVENOUS

## 2018-08-17 NOTE — Progress Notes (Signed)
Date:  08/17/2018   Name:  Clifford Herrera   DOB:  November 24, 1940   MRN:  [id number]   Chief Complaint: Anxiety (GAd7- 16 Depression- 16 ) and Immunizations (high dose flu shot. )  Anxiety  Presents for initial visit. Onset was at an unknown time. The problem has been gradually worsening. Symptoms include decreased concentration, depressed mood, excessive worry, insomnia, irritability, nervous/anxious behavior, restlessness and shortness of breath. Patient reports no chest pain, dizziness, nausea, palpitations or suicidal ideas. Symptoms occur most days. The severity of symptoms is moderate. Exacerbated by: health issues.   There is no history of anxiety/panic attacks.    Review of Systems  Constitutional: Positive for appetite change, irritability and unexpected weight change.  Respiratory: Positive for cough and shortness of breath. Negative for chest tightness.   Cardiovascular: Negative for chest pain and palpitations.  Gastrointestinal: Negative for abdominal pain, constipation, diarrhea and nausea.  Neurological: Negative for dizziness and headaches.  Psychiatric/Behavioral: Positive for decreased concentration, dysphoric mood and sleep disturbance. Negative for suicidal ideas. The patient is nervous/anxious and has insomnia.     Patient Active Problem List   Diagnosis Date Noted  . SOB (shortness of breath) 08/06/2018  . Chronic systolic CHF (congestive heart failure) ([place])   . Atherosclerosis of aorta ([place]) 07/21/2018  . Pneumonia of left lower lobe due to infectious organism ([place]) 06/29/2018  . Hyponatremia 06/29/2018  . Goals of care, counseling/discussion 04/11/2018  . Automatic implantable cardioverter-defibrillator in situ 11/16/2017  . Vitamin D deficiency 09/16/2017  . Hypothyroidism 09/16/2017  . BPH associated with nocturia 08/20/2017  . Allergic rhinitis 08/17/2017  . Gait instability 08/17/2017  . Hypocalcemia 08/17/2017  . CKD (chronic kidney disease) stage 3,  GFR 30-59 ml/min (HCC) 08/17/2017  . Gout 08/17/2017  . RLS (restless legs syndrome) 10/23/2016  . 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 Moderate mitral insufficiency 02/21/2015  . Coronary artery disease involving native coronary artery of native heart without angina pectoris 09/18/2014  . Paroxysmal A-fib ([place]) 09/01/2014  . Chronic systolic heart failure ([place]) 07/13/2014  . CLL (chronic lymphocytic leukemia) ([place]) 02/07/2014  . COPD, mild ([place]) 02/07/2014  . OSA on CPAP 02/07/2014  . Polycythemia 02/07/2014  . DJD (degenerative joint disease) of hip 06/24/2011    No Known Allergies  Past Surgical History:  Procedure Laterality Date  . CARDIAC DEFIBRILLATOR PLACEMENT  2015    Social History   Tobacco Use  . Smoking status: Former Smoker    Packs/day: 2.00    Years: 42.00    Pack years: 84.00    Types: Cigarettes    Last attempt to quit: 1991    Years since quitting: 28.9  . Smokeless tobacco: Current User    Types: Chew  . Tobacco comment: smoking cessation materials not required  Substance Use Topics  . Alcohol use: Yes    Comment: 6 beers per month  . Drug use: No     Medication list has been reviewed and updated.  Current Meds  Medication Sig  . acetaminophen (TYLENOL) 500 MG tablet Take 1,000 mg by mouth every 6 (six) hours as needed for mild pain, moderate pain, fever or headache.  Marland Kitchen acyclovir (ZOVIRAX) 400 MG tablet TAKE 1 TABLET BY MOUTH ONCE DAILY TO PREVENT SHINGLES (Patient taking differently: Take 400 mg by mouth daily. )  . albuterol (PROVENTIL HFA;VENTOLIN HFA) 108 (90 Base) MCG/ACT inhaler Inhale 2 puffs into the lungs every 6 (six) hours as needed  for wheezing or shortness of breath.  . allopurinol (ZYLOPRIM) 100 MG tablet TAKE 1 TABLET BY MOUTH ONCE EVERY EVENING (Patient taking differently: Take 100 mg by mouth every evening. )  . ALPRAZolam (XANAX) 0.25 MG tablet Take 1 tablet (0.25 mg total) by mouth 2  (two) times daily as needed for anxiety.  Marland Kitchen arformoterol (BROVANA) 15 MCG/2ML NEBU Take 2 mLs (15 mcg total) by nebulization 2 (two) times daily.  . budesonide (PULMICORT) 0.25 MG/2ML nebulizer solution Take 2 mLs (0.25 mg total) by nebulization 2 (two) times daily.  Marland Kitchen Dextromethorphan Polistirex (ROBITUSSIN 12 HOUR COUGH PO) Take 5 mLs by mouth daily.   Marland Kitchen guaiFENesin (MUCINEX) 600 MG 12 hr tablet Take 600 mg by mouth 2 (two) times daily.   Marland Kitchen levothyroxine (SYNTHROID, LEVOTHROID) 75 MCG tablet TAKE 1 TABLET BY MOUTH ONCE DAILY BEFOREBREAKFAST. (Patient taking differently: Take 75 mcg by mouth daily before breakfast. )  . meclizine (ANTIVERT) 25 MG tablet Take 1 tablet (25 mg total) by mouth 2 (two) times daily as needed for dizziness.  . metoprolol tartrate (LOPRESSOR) 25 MG tablet Take 25 mg by mouth 2 (two) times daily.   . montelukast (SINGULAIR) 10 MG tablet TAKE 1 TABLET BY MOUTH AT BEDTIME START 2 DAYS PRIOR TO INFUSION FOR 4 DAYS  . Respiratory Therapy Supplies (FLUTTER) DEVI 1 Device by Does not apply route 4 (four) times daily.  Marland Kitchen rOPINIRole (REQUIP) 0.25 MG tablet TAKE 2 TABLETS BY MOUTH AT BEDTIME (Patient taking differently: Take 0.5 mg by mouth at bedtime. )  . Spacer/Aero-Holding Chambers DEVI 1 Device by Does not apply route 2 (two) times daily.  Marland Kitchen spironolactone (ALDACTONE) 25 MG tablet TAKE 1/2 TABLET BY MOUTH ONCE DAILY (Patient taking differently: Take 12.5 mg by mouth daily. )  . tamsulosin (FLOMAX) 0.4 MG CAPS capsule Take 1 capsule (0.4 mg total) by mouth daily after supper.  . tiotropium (SPIRIVA HANDIHALER) 18 MCG inhalation capsule Place 1 capsule (18 mcg total) into inhaler and inhale daily for 30 doses.  Marland Kitchen torsemide (DEMADEX) 20 MG tablet Take 1 tablet (20 mg total) by mouth daily. Take 40 mg po bid for 3days and resume home dose of  20 mg po BID after 3days (Patient taking differently: Take 20 mg by mouth 2 (two) times daily. )  . Vitamin D, Cholecalciferol, 1000 units  CAPS Take 2 capsules by mouth daily. (Patient taking differently: Take 2,000 Units by mouth daily. )  . [DISCONTINUED] ALPRAZolam (XANAX) 0.25 MG tablet Take 0.25 mg by mouth 2 (two) times daily as needed for anxiety.    PHQ 2/9 Scores 08/17/2018 07/22/2018 10/07/2017 08/17/2017  PHQ - 2 Score 2 0 0 0  PHQ- 9 Score 16 - - -   GAD 7 : Generalized Anxiety Score 08/17/2018  Nervous, Anxious, on Edge 2  Control/stop worrying 2  Worry too much - different things 2  Trouble relaxing 3  Restless 3  Easily annoyed or irritable 3  Afraid - awful might happen 1  Total GAD 7 Score 16  Anxiety Difficulty Not difficult at all     Physical Exam  Constitutional: He is oriented to person, place, and time. He appears well-developed. No distress.  HENT:  Head: Normocephalic and atraumatic.  Cardiovascular: Normal rate, regular rhythm and normal heart sounds.  Pulmonary/Chest: Effort normal. No respiratory distress. He has decreased breath sounds in the right lower field. He has no wheezes. He has no rhonchi.  Musculoskeletal: Normal range of motion.  Neurological: He  is alert and oriented to person, place, and time.  Skin: Skin is warm and dry. No rash noted.  Psychiatric: His speech is normal. Thought content normal. His mood appears anxious. He is agitated. He expresses no suicidal ideation. He expresses no suicidal plans.  Nursing note and vitals reviewed.   BP 100/64 (BP Location: Left Arm, Patient Position: Sitting, Cuff Size: Normal)   Pulse 92   Ht 6\' 1"  (1.854 m)   Wt 198 lb (89.8 kg)   SpO2 94%   BMI 26.12 kg/m   Assessment and Plan: 1. Current moderate episode of major depressive disorder without prior episode (HCC) Begin zoloft - sertraline (ZOLOFT) 50 MG tablet; Take 1 tablet (50 mg total) by mouth daily.  Dispense: 30 tablet; Refill: 1  2. Generalized anxiety disorder Continue xanax prn - ALPRAZolam (XANAX) 0.25 MG tablet; Take 1 tablet (0.25 mg total) by mouth 2 (two) times  daily as needed for anxiety.  Dispense: 60 tablet; Refill: 1  3. Encounter for immunization - Flu vaccine HIGH DOSE PF  4. 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 Any errors are unintentional.  Halina Maidens, MD [facility]  08/17/2018

## 2018-08-18 ENCOUNTER — Telehealth: Payer: Self-pay

## 2018-08-18 NOTE — Telephone Encounter (Signed)
Completed patient Prior Authorization online through covermymeds for  ALPRAZolam (XANAX) 0.25 MG tablet 0.25 mg, 2 times daily PRN   Sent to plan. Will await fax with outcome. Should receive Fax within 72 hours of sent PA.  CNM

## 2018-08-20 NOTE — Telephone Encounter (Signed)
Patient medication was DENIED.  Request reference: [id number]  "Denied medication because of benefit exclusion. Drug coverage is limited to only those drugs covered under your medical benefit. "  Gave to Dr Army Melia for review of denial.

## 2018-08-24 ENCOUNTER — Telehealth: Payer: Self-pay

## 2018-08-24 ENCOUNTER — Telehealth: Payer: Self-pay | Admitting: Pulmonary Disease

## 2018-08-24 NOTE — Telephone Encounter (Signed)
[name] [facility] calling wanting to see if we can order  Physical/skilled nursing/and occupational therapy for patient  Would like a call back about this

## 2018-08-24 NOTE — Telephone Encounter (Signed)
[name] from [facility] called stating they received orders from the patient's Pulmonologist- Dr Vernard Gambles for orders to do an evaluation at the patient's home.  [name] stated they did eval yesterday with patient and wanted to call for [facility] referral for patient to have PT, OT, and skilled nursing in the home for assistance. Was told by Pulmonologist to call PCP for these orders.  Please Advise.

## 2018-08-24 NOTE — Telephone Encounter (Signed)
May give verbal orders and then have them send the final to sign and send back.

## 2018-08-24 NOTE — Telephone Encounter (Signed)
I spoke to Clifford Herrera with Palliative care. Let her know that usually primary care will order these therapies. She noted she did leave a message with them as well. We will see patient tomorrow and assess orders as needed. [name] aware.

## 2018-08-25 ENCOUNTER — Inpatient Hospital Stay: Payer: Medicare Other | Attending: Internal Medicine

## 2018-08-25 ENCOUNTER — Ambulatory Visit (INDEPENDENT_AMBULATORY_CARE_PROVIDER_SITE_OTHER): Payer: Medicare Other | Admitting: Pulmonary Disease

## 2018-08-25 ENCOUNTER — Inpatient Hospital Stay (HOSPITAL_BASED_OUTPATIENT_CLINIC_OR_DEPARTMENT_OTHER): Payer: Medicare Other | Admitting: Internal Medicine

## 2018-08-25 ENCOUNTER — Encounter: Payer: Self-pay | Admitting: Pulmonary Disease

## 2018-08-25 ENCOUNTER — Other Ambulatory Visit: Payer: Self-pay | Admitting: Internal Medicine

## 2018-08-25 ENCOUNTER — Other Ambulatory Visit: Payer: Self-pay | Admitting: *Deleted

## 2018-08-25 VITALS — BP 122/64 | HR 80 | Ht 73.0 in | Wt 205.8 lb

## 2018-08-25 VITALS — BP 96/62 | HR 85 | Temp 97.7°F | Resp 16 | Wt 205.6 lb

## 2018-08-25 DIAGNOSIS — C8303 Small cell B-cell lymphoma, intra-abdominal lymph nodes: Secondary | ICD-10-CM

## 2018-08-25 DIAGNOSIS — J984 Other disorders of lung: Secondary | ICD-10-CM | POA: Insufficient documentation

## 2018-08-25 DIAGNOSIS — J449 Chronic obstructive pulmonary disease, unspecified: Secondary | ICD-10-CM | POA: Diagnosis not present

## 2018-08-25 DIAGNOSIS — J8489 Other specified interstitial pulmonary diseases: Secondary | ICD-10-CM | POA: Diagnosis not present

## 2018-08-25 DIAGNOSIS — C9110 Chronic lymphocytic leukemia of B-cell type not having achieved remission: Secondary | ICD-10-CM

## 2018-08-25 DIAGNOSIS — R5383 Other fatigue: Secondary | ICD-10-CM

## 2018-08-25 DIAGNOSIS — I509 Heart failure, unspecified: Secondary | ICD-10-CM | POA: Diagnosis not present

## 2018-08-25 DIAGNOSIS — I428 Other cardiomyopathies: Secondary | ICD-10-CM

## 2018-08-25 DIAGNOSIS — D809 Immunodeficiency with predominantly antibody defects, unspecified: Secondary | ICD-10-CM | POA: Diagnosis not present

## 2018-08-25 DIAGNOSIS — Z7901 Long term (current) use of anticoagulants: Secondary | ICD-10-CM

## 2018-08-25 DIAGNOSIS — I4891 Unspecified atrial fibrillation: Secondary | ICD-10-CM | POA: Diagnosis not present

## 2018-08-25 DIAGNOSIS — E871 Hypo-osmolality and hyponatremia: Secondary | ICD-10-CM | POA: Insufficient documentation

## 2018-08-25 DIAGNOSIS — R05 Cough: Secondary | ICD-10-CM

## 2018-08-25 DIAGNOSIS — Z87891 Personal history of nicotine dependence: Secondary | ICD-10-CM | POA: Insufficient documentation

## 2018-08-25 DIAGNOSIS — R0609 Other forms of dyspnea: Secondary | ICD-10-CM

## 2018-08-25 LAB — CBC WITH DIFFERENTIAL/PLATELET
Abs Immature Granulocytes: 0.06 10*3/uL (ref 0.00–0.07)
BASOS ABS: 0.1 10*3/uL (ref 0.0–0.1)
Basophils Relative: 1 %
EOS ABS: 0 10*3/uL (ref 0.0–0.5)
EOS PCT: 0 %
HCT: 41.7 % (ref 39.0–52.0)
Hemoglobin: 13.8 g/dL (ref 13.0–17.0)
IMMATURE GRANULOCYTES: 1 %
Lymphocytes Relative: 31 %
Lymphs Abs: 3.3 10*3/uL (ref 0.7–4.0)
MCH: 27.8 pg (ref 26.0–34.0)
MCHC: 33.1 g/dL (ref 30.0–36.0)
MCV: 83.9 fL (ref 80.0–100.0)
Monocytes Absolute: 0.7 10*3/uL (ref 0.1–1.0)
Monocytes Relative: 6 %
NEUTROS PCT: 61 %
NRBC: 0 % (ref 0.0–0.2)
Neutro Abs: 6.6 10*3/uL (ref 1.7–7.7)
PLATELETS: 104 10*3/uL — AB (ref 150–400)
RBC: 4.97 MIL/uL (ref 4.22–5.81)
RDW: 15.9 % — AB (ref 11.5–15.5)
WBC: 10.7 10*3/uL — AB (ref 4.0–10.5)

## 2018-08-25 LAB — COMPREHENSIVE METABOLIC PANEL
ALBUMIN: 3.8 g/dL (ref 3.5–5.0)
ALK PHOS: 59 U/L (ref 38–126)
ALT: 17 U/L (ref 0–44)
AST: 25 U/L (ref 15–41)
Anion gap: 8 (ref 5–15)
BILIRUBIN TOTAL: 1.2 mg/dL (ref 0.3–1.2)
BUN: 19 mg/dL (ref 8–23)
CALCIUM: 8.3 mg/dL — AB (ref 8.9–10.3)
CO2: 29 mmol/L (ref 22–32)
Chloride: 91 mmol/L — ABNORMAL LOW (ref 98–111)
Creatinine, Ser: 1.14 mg/dL (ref 0.61–1.24)
GFR calc Af Amer: >60 mL/min (ref >60)
GFR calc non Af Amer: >60 mL/min (ref >60)
GLUCOSE: 97 mg/dL (ref 70–99)
Potassium: 4.1 mmol/L (ref 3.5–5.1)
SODIUM: 128 mmol/L — AB (ref 135–145)
TOTAL PROTEIN: 5.6 g/dL — AB (ref 6.5–8.1)

## 2018-08-25 LAB — LACTATE DEHYDROGENASE: LDH: 128 U/L (ref 98–192)

## 2018-08-25 MED ORDER — PREDNISONE 20 MG PO TABS: 20.0000 mg | ORAL_TABLET | Freq: Every day | ORAL | 0 refills | Status: AC

## 2018-08-25 NOTE — Patient Instructions (Signed)
1) Prednisone 20 mg one daily for 7 days  2) Drink Enlive (new version of Ensure) to supplement meals  3) we will see you in 3 weeks

## 2018-08-25 NOTE — Progress Notes (Signed)
[place] OFFICE PROGRESS NOTE  Patient Care Team: [name], MD as PCP - General (Internal Medicine) Corey Skains, MD as Consulting Physician (Cardiology) Cammie Sickle, MD as Medical Oncologist (Hematology and Oncology)  Cancer Staging No matching staging information was found for the patient.   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 Surveillance; [facility]- UNMUTATED [dec 2017]  # AUG 2018- FISH panel analysis was positive for loss of one 13q14 signal. NEGATIVE for CCND1/IGH, ATM, chromosome 12, and TP53 were normal. MAY 2019- : 85% OF NUCLEI POSITIVE FOR A 13Q DELETION  #CHF/CAD s/p Defib- on Eliquis  [Dr.Kowalski]; Orthostatic hypotension   -------------------------------------------------------------   DIAGNOSIS: CLL  STAGE:  IV ;GOALS: CONTROL/palliative  CURRENT/MOST RECENT THERAPY;: July 29th, 2019 GAZYVA      CLL (chronic lymphocytic leukemia) ([place])   04/25/2018 -  Chemotherapy    The patient had obinutuzumab (GAZYVA) 100 mg in sodium chloride 0.9 % 100 mL (0.9615 mg/mL) chemo infusion, 100 mg, Intravenous, Once, 2 of 6 cycles Administration: 100 mg (04/26/2018), 900 mg (04/27/2018), 1,000 mg (06/14/2018), 1,000 mg (06/02/2018)  for chemotherapy treatment.        INTERVAL HISTORY:  Clifford Herrera 77 y.o.  male pleasant patient [name] currently on [name] is here for follow-up.  Patient currently status post cycle #2..  Further therapy is on hold because of left lower lobe pneumonia.  Patient had a recent PET scan through pulmonary.  Patient complains of mild to moderate cough.  Continued shortness of breath especially with exertion.  Appetite is fair.  Continues to have fatigue overall slightly improved.  No fevers or chills.  Review of Systems  Constitutional: Positive  for malaise/fatigue. Negative for chills, diaphoresis, fever and weight loss.  HENT: Negative for nosebleeds and sore throat.   Eyes: Negative for double vision.  Respiratory: Positive for cough and shortness of breath. Negative for hemoptysis, sputum production and wheezing.   Cardiovascular: Negative for chest pain, palpitations, orthopnea and leg swelling.  Gastrointestinal: Negative for abdominal pain, blood in stool, constipation, diarrhea, heartburn, melena, nausea and vomiting.  Genitourinary: Negative for dysuria, frequency and urgency.  Musculoskeletal: Negative for joint pain.  Skin: Negative.  Negative for itching and rash.  Neurological: Negative for dizziness, tingling, focal weakness, weakness and headaches.  Endo/Heme/Allergies: Bruises/bleeds easily (improving).  Psychiatric/Behavioral: Positive for memory loss. Negative for depression. The patient is not nervous/anxious and does not have insomnia.       PAST MEDICAL HISTORY :  Past Medical History:  Diagnosis Date  . Chronic systolic CHF (congestive heart failure) (HCC)    25%  . CLL (chronic lymphocytic leukemia) ([place])   . COPD (chronic obstructive pulmonary disease) ([place])   . CPAP (continuous positive airway pressure) dependence   . Heart failure ([place])   . HOH (hard of hearing)   . Presence of automatic (implantable) cardiac defibrillator   . Sleep apnea   . Upper respiratory infection    chronic    PAST SURGICAL HISTORY :   Past Surgical History:  Procedure Laterality Date  . CARDIAC DEFIBRILLATOR PLACEMENT  2015    FAMILY HISTORY :   Family History  Problem Relation Age of Onset  . Diabetes Mother   . CAD Mother   . Diabetes Father   . CAD Father     SOCIAL HISTORY:   Social  History   Tobacco Use  . Smoking status: Former Smoker    Packs/day: 2.00    Years: 42.00    Pack years: 84.00    Types: Cigarettes    Last attempt to quit: 1991    Years since quitting: 28.9  . Smokeless tobacco:  Current User    Types: Chew  . Tobacco comment: smoking cessation materials not required  Substance Use Topics  . Alcohol use: Yes    Comment: 6 beers per month  . Drug use: No    ALLERGIES:  has No Known Allergies.  MEDICATIONS:  Current Outpatient Medications  Medication Sig Dispense Refill  . acetaminophen (TYLENOL) 500 MG tablet Take 1,000 mg by mouth every 6 (six) hours as needed for mild pain, moderate pain, fever or headache.    Marland Kitchen acyclovir (ZOVIRAX) 400 MG tablet TAKE 1 TABLET BY MOUTH ONCE DAILY TO PREVENT SHINGLES (Patient taking differently: Take 400 mg by mouth daily. ) 30 tablet 3  . albuterol (PROVENTIL HFA;VENTOLIN HFA) 108 (90 Base) MCG/ACT inhaler Inhale 2 puffs into the lungs every 6 (six) hours as needed for wheezing or shortness of breath. 1 Inhaler 2  . allopurinol (ZYLOPRIM) 100 MG tablet TAKE 1 TABLET BY MOUTH ONCE EVERY EVENING (Patient taking differently: Take 100 mg by mouth every evening. ) 30 tablet 5  . ALPRAZolam (XANAX) 0.25 MG tablet Take 1 tablet (0.25 mg total) by mouth 2 (two) times daily as needed for anxiety. 60 tablet 1  . arformoterol (BROVANA) 15 MCG/2ML NEBU Take 2 mLs (15 mcg total) by nebulization 2 (two) times daily. 120 mL 2  . budesonide (PULMICORT) 0.25 MG/2ML nebulizer solution Take 2 mLs (0.25 mg total) by nebulization 2 (two) times daily. 60 mL 12  . dexamethasone (DECADRON) 4 MG tablet Take 1 tablet (4 mg total) by mouth daily. Take 1 tablet starting 2 days prior to infusion. Do not take on day of infusion 30 tablet 1  . Dextromethorphan Polistirex (ROBITUSSIN 12 HOUR COUGH PO) Take 5 mLs by mouth daily.     Marland Kitchen guaiFENesin (MUCINEX) 600 MG 12 hr tablet Take 600 mg by mouth 2 (two) times daily.     Marland Kitchen levothyroxine (SYNTHROID, LEVOTHROID) 75 MCG tablet TAKE 1 TABLET BY MOUTH ONCE DAILY BEFOREBREAKFAST. (Patient taking differently: Take 75 mcg by mouth daily before breakfast. ) 90 tablet 3  . meclizine (ANTIVERT) 25 MG tablet Take 1 tablet (25  mg total) by mouth 2 (two) times daily as needed for dizziness. 30 tablet 0  . metoprolol tartrate (LOPRESSOR) 25 MG tablet Take 25 mg by mouth 2 (two) times daily.     . montelukast (SINGULAIR) 10 MG tablet TAKE 1 TABLET BY MOUTH AT BEDTIME START 2 DAYS PRIOR TO INFUSION FOR 4 DAYS 30 tablet 2  . Respiratory Therapy Supplies (FLUTTER) DEVI 1 Device by Does not apply route 4 (four) times daily. 1 each 0  . rOPINIRole (REQUIP) 0.25 MG tablet TAKE 2 TABLETS BY MOUTH AT BEDTIME (Patient taking differently: Take 0.5 mg by mouth at bedtime. ) 60 tablet 0  . sertraline (ZOLOFT) 50 MG tablet Take 1 tablet (50 mg total) by mouth daily. 30 tablet 1  . Spacer/Aero-Holding Chambers DEVI 1 Device by Does not apply route 2 (two) times daily. 1 each 0  . spironolactone (ALDACTONE) 25 MG tablet TAKE 1/2 TABLET BY MOUTH ONCE DAILY (Patient taking differently: Take 12.5 mg by mouth daily. ) 45 tablet 3  . tamsulosin (FLOMAX) 0.4 MG CAPS capsule  Take 1 capsule (0.4 mg total) by mouth daily after supper. 90 capsule 3  . torsemide (DEMADEX) 20 MG tablet Take 1 tablet (20 mg total) by mouth daily. Take 40 mg po bid for 3days and resume home dose of  20 mg po BID after 3days (Patient taking differently: Take 20 mg by mouth 2 (two) times daily. ) 30 tablet 0  . Vitamin D, Cholecalciferol, 1000 units CAPS Take 2 capsules by mouth daily. (Patient taking differently: Take 2,000 Units by mouth daily. ) 60 capsule   . predniSONE (DELTASONE) 20 MG tablet Take 1 tablet (20 mg total) by mouth daily for 7 days. 7 tablet 0  . tiotropium (SPIRIVA HANDIHALER) 18 MCG inhalation capsule Place 1 capsule (18 mcg total) into inhaler and inhale daily for 30 doses. 30 capsule 0   No current facility-administered medications for this visit.     PHYSICAL EXAMINATION: ECOG PERFORMANCE STATUS: 1 - Symptomatic but completely ambulatory  BP 96/62 (BP Location: Left Arm, Patient Position: Sitting)   Pulse 85   Temp 97.7 F (36.5 C)  (Tympanic)   Resp 16   Wt 205 lb 9.6 oz (93.3 kg)   BMI 27.13 kg/m   Filed Weights   08/25/18 1028  Weight: 205 lb 9.6 oz (93.3 kg)    Physical Exam  Constitutional: He is oriented to person, place, and time and well-developed, well-nourished, and in no distress.  Accompanied by his wife.  Is walking himself.  HENT:  Head: Normocephalic and atraumatic.  Mouth/Throat: Oropharynx is clear and moist. No oropharyngeal exudate.  Eyes: Pupils are equal, round, and reactive to light.  Neck: Normal range of motion. Neck supple.  Cardiovascular: Normal rate and regular rhythm.  Pulmonary/Chest: No respiratory distress. He has no wheezes.  Decreased air entry left more than right at the bases.  Abdominal: Soft. Bowel sounds are normal. He exhibits no distension and no mass. There is no tenderness. There is no rebound and no guarding.  Positive for splenomegaly.  Musculoskeletal: Normal range of motion. He exhibits no edema or tenderness.  Neurological: He is alert and oriented to person, place, and time.  Skin: Skin is warm.  Multiple bruises chronic-upper lower extremities.  Psychiatric: Affect normal.       LABORATORY DATA:  I have reviewed the data as listed    Component Value Date/Time   NA 128 (L) 08/25/2018 0931   NA 137 12/04/2017 1050   NA 136 07/25/2014 0414   K 4.1 08/25/2018 0931   K 5.6 (H) 07/25/2014 0414   CL 91 (L) 08/25/2018 0931   CL 101 07/25/2014 0414   CO2 29 08/25/2018 0931   CO2 24 07/25/2014 0414   GLUCOSE 97 08/25/2018 0931   GLUCOSE 143 (H) 07/25/2014 0414   BUN 19 08/25/2018 0931   BUN 15 12/04/2017 1050   BUN 33 (H) 07/25/2014 0414   CREATININE 1.14 08/25/2018 0931   CREATININE 1.34 (H) 01/15/2015 0956   CALCIUM 8.3 (L) 08/25/2018 0931   CALCIUM 8.3 (L) 07/25/2014 0414   PROT 5.6 (L) 08/25/2018 0931   PROT 5.8 (L) 08/17/2017 1551   PROT 6.4 07/24/2014 0937   ALBUMIN 3.8 08/25/2018 0931   ALBUMIN 4.3 08/17/2017 1551   ALBUMIN 4.0 07/24/2014  0937   AST 25 08/25/2018 0931   AST 31 07/24/2014 0937   ALT 17 08/25/2018 0931   ALT 23 07/24/2014 0937   ALKPHOS 59 08/25/2018 0931   ALKPHOS 53 07/24/2014 0937   BILITOT 1.2 08/25/2018  0931   BILITOT 0.4 08/17/2017 1551   BILITOT 1.1 (H) 07/24/2014 0937   GFRNONAA >60 08/25/2018 0931   GFRNONAA 52 (L) 01/15/2015 0956   GFRAA >60 08/25/2018 0931   GFRAA >60 01/15/2015 0956    No results found for: SPEP, UPEP  Lab Results  Component Value Date   WBC 10.7 (H) 08/25/2018   NEUTROABS 6.6 08/25/2018   HGB 13.8 08/25/2018   HCT 41.7 08/25/2018   MCV 83.9 08/25/2018   PLT 104 (L) 08/25/2018      Chemistry      Component Value Date/Time   NA 128 (L) 08/25/2018 0931   NA 137 12/04/2017 1050   NA 136 07/25/2014 0414   K 4.1 08/25/2018 0931   K 5.6 (H) 07/25/2014 0414   CL 91 (L) 08/25/2018 0931   CL 101 07/25/2014 0414   CO2 29 08/25/2018 0931   CO2 24 07/25/2014 0414   BUN 19 08/25/2018 0931   BUN 15 12/04/2017 1050   BUN 33 (H) 07/25/2014 0414   CREATININE 1.14 08/25/2018 0931   CREATININE 1.34 (H) 01/15/2015 0956      Component Value Date/Time   CALCIUM 8.3 (L) 08/25/2018 0931   CALCIUM 8.3 (L) 07/25/2014 0414   ALKPHOS 59 08/25/2018 0931   ALKPHOS 53 07/24/2014 0937   AST 25 08/25/2018 0931   AST 31 07/24/2014 0937   ALT 17 08/25/2018 0931   ALT 23 07/24/2014 0937   BILITOT 1.2 08/25/2018 0931   BILITOT 0.4 08/17/2017 1551   BILITOT 1.1 (H) 07/24/2014 1540       RADIOGRAPHIC STUDIES: I have personally reviewed the radiological images as listed and agreed with the findings in the report. No results found.   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  # recent pneumonia/ persistent LLL infiltrate-CT scan/PET scan-clinically " organizing pneumonia" as per pulmonary.  Discussed with Dr. [name].  Okay to proceed with prednisone.  # History of A. fib on Eliquis-stable.  #CHF on Demadex stable.  # continue Infectious prophylaxis: Acyclovir ; continue allopurinol.   # Hyponatremia- 128; likely secondary to diuretics.  Repeat bmp in 2 weeks.   # DISPOSITION: # labs- bmp in 2 weeks-  # follow up in 2  Months-MD/labs-  CBC/CMP/LDH- Dr.B  Addendum: Quantitative immunoglobulins show decreased IgG-189.  Would recommend IgG infusions.  Will discuss with patient/family.   No orders of the defined types were placed in this encounter.  All questions were answered. The patient knows to call the clinic with any problems, questions or concerns.      Cammie Sickle, MD 08/31/2018 8:45 PM

## 2018-08-25 NOTE — Telephone Encounter (Signed)
LVM to confirm verbal orders

## 2018-08-25 NOTE — Assessment & Plan Note (Addendum)
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  # recent pneumonia/ persistent LLL infiltrate-CT scan/PET scan-clinically " organizing pneumonia" as per pulmonary.  Discussed with Dr. [name].  Okay to proceed with prednisone.  # History of A. fib on Eliquis-stable.  #CHF on Demadex stable.  # continue Infectious prophylaxis: Acyclovir ; continue allopurinol.   # Hyponatremia- 128; likely secondary to diuretics.  Repeat bmp in 2 weeks.   # DISPOSITION: # labs- bmp in 2 weeks-  # follow up in 2  Months-MD/labs-  CBC/CMP/LDH- Dr.B  Addendum: Quantitative immunoglobulins show decreased IgG-189.  Would recommend IgG infusions.  Will discuss with patient/family.

## 2018-08-25 NOTE — Progress Notes (Signed)
Subjective:    Patient ID: Clifford Herrera, male    DOB: [birth year], 77 y.o.   MRN: [id number]  HPI Patient is a 77 year old former smoker (1991) with a history of CLL who follows up with regards to an abnormal CT scan of the chest and dyspnea after a bout of pneumonia.  The patient was initially seen on 25 October.  Subsequently we saw him on 13 November.  The patient was started on treatment for CLL with [facility] on 29 July.  He had the second dose on 30 July and a third dose on 16 September.  To that dose the patient experience issues with cough productive of purulent sputum and generalized malaise.  Pneumonia was diagnosed after a chest x-ray was done on 29 September that showed dense retrocardiac infiltrate.  He was continued on antibiotics.  The patient had a CT scan of the chest on 17 October that shows persistence of infiltrate which had the appearance of organizing pneumonia.  He also had mucous plugging on the distal bronchi.  4 initial interventions included institution of a flutter valve for airway clearance.  He also received steroids, he had to be admitted due to persistent symptoms and failure to thrive after this episode.  He was admitted on 8 November to [facility] and was discharge a day or 2 later.  CT scan of the chest obtained then showed persistence of the infiltrates however by my measurements these were reducing in size.  Confounding the patient's issues are the fact that he had a 2D echo while hospitalized that shows that his ventricular ejection fraction is 20 to 25%.  He had diuretics increased which alleviated his dyspnea significantly.  We saw him on 13 November and at that time he was not markedly improved except his dyspnea was improving.  He continues to be very debilitated.  He was enrolled in palliative care.  We ordered a PET/CT to evaluate the status of his CLL and to see if the infiltrates in question were more consistent with malignancy.  The infiltrates in question appears  smaller on the PET/CT and the FDG uptake is consistent with inflammatory change.  Today the patient presents for follow-up and actually looks markedly improved.  He is not relying on a wheelchair for ambulation.  He is no longer dyspneic but continues to have issues with cough productive of whitish sputum.  We did start Pulmicort and budesonide via nebulizer during his last visit and he has noted that this is helping him.  He has not had any fevers, chills or sweats since his initial presentation.  He has not had any orthopnea paroxysmal nocturnal dyspnea since he was started on diuretics.  His diuretic regimen includes Demadex and Aldactone.  He appears to be tolerating this well.  Today he presents with his wife who also states that she notes marked improvement.  They are also grateful for palliative care.   Review of Systems  Constitutional: Positive for appetite change (Improving but he has had decreased appetite throughout this illness) and fatigue.  HENT: Negative.   Eyes: Negative.   Respiratory: Negative.   Cardiovascular: Positive for palpitations.  Gastrointestinal: Negative.   Endocrine: Negative.   Genitourinary: Negative.   Musculoskeletal: Negative.   Skin: Negative.   Allergic/Immunologic: Negative.   Neurological: Negative.   Hematological: Negative.   Psychiatric/Behavioral: Negative.   All other systems reviewed and are negative.      Objective:   Physical Exam  Constitutional: He is oriented to person,  place, and time. He appears well-developed and well-nourished.  Non-toxic appearance. He appears ill (Chronically ill-appearing). No distress.  HENT:  Head: Normocephalic and atraumatic.  Mouth/Throat: Oropharynx is clear and moist.  Eyes: Pupils are equal, round, and reactive to light. EOM are normal.  Neck: Normal range of motion. Neck supple.  Cardiovascular: S1 normal, S2 normal and intact distal pulses. An irregularly irregular rhythm present.  No extrasystoles  are present. Exam reveals no S3.  Pacer/defib  Pulmonary/Chest: Effort normal. No accessory muscle usage. He has wheezes (Faint end expiratory, diffuse). He has rhonchi in the right lower field and the left lower field. He has no rales.  Abdominal: Soft. Bowel sounds are normal. He exhibits no distension.  Musculoskeletal: Normal range of motion. He exhibits no edema.  Neurological: He is alert and oriented to person, place, and time.  No focal deficit  Skin: Skin is warm and dry.  Psychiatric: He has a normal mood and affect. His behavior is normal. Thought content normal.  In good spirits today.          Assessment & Plan:   1) Organizing Pneumonia: Suspect that the findings are consistent with organizing pneumonia that is slow to resolve.  His PET/CT shows findings more consistent with inflammation.  The infiltrates appear to be decreasing in size.  We will treat as if it where organizing pneumonia and give him 20 mg of prednisone x7 days. Continue pulmonary toilet as before.  We will continue to follow ET of the chest.  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 I suspect that this was the main component of his issues with dyspnea which are now completely resolved.  He has had adjustments to his CHF regimen.  This issue adds complexity to his management.  3) Chronic Obstructive Pulmonary Disease: by prior spirometry he is at least GOLD class 2 (moderate), he did get some benefit from Symbicort but even with a spacer he was having difficulties dosing the medication. He did well with the nebulizers in the hospital.  He is now on Brovana 15 g twice a day via nebulizer and Pulmicort 0.25 mg twice a day via nebulizer followed by the use of a flutter valve feels that this is helping his pulmonary toilet.    4) Chronic Lymphocytic Leukemia/Small Lymphocytic Lymphoma: per medical oncology's note  of 14 October the patient is stage IV disease with his CLL/SLL. He received Gazyva with a goal of control/palliation.  For now he appears to be controlled after his setback with pneumonia.   5) Atrial fibrillation: the patient is now off of anticoagulants due to significant thrombocytopenia. Eliquis was discontinued during his recent hospitalization.  His thrombocytopenia has now resolved.  Dr. [name] has resume Eliquis today.   We will continue to follow him closely.  We will see him in follow-up in 3 weeks time.  He is to contact us prior to that time should any new difficulties arise.

## 2018-08-25 NOTE — Progress Notes (Signed)
Nutrition  Received message that daughter, Nira Conn had left message regarding scheduling appointment with RD.    Called daughter and nutrition appointment scheduled for 12/12 at 11:15.  Therin Vetsch B. Zenia Resides, [place], [place] Registered Dietitian [phone] (pager)

## 2018-08-28 LAB — IMMUNOGLOBULINS A/E/G/M, SERUM
IGA: 17 mg/dL — AB (ref 61–437)
IgE (Immunoglobulin E), Serum: 4 IU/mL — ABNORMAL LOW (ref 6–495)
IgG (Immunoglobin G), Serum: 189 mg/dL — ABNORMAL LOW (ref 700–1600)
IgM (Immunoglobulin M), Srm: <5 mg/dL — ABNORMAL LOW (ref 15–143)

## 2018-09-01 ENCOUNTER — Other Ambulatory Visit: Payer: Self-pay

## 2018-09-01 ENCOUNTER — Telehealth: Payer: Self-pay

## 2018-09-01 DIAGNOSIS — I509 Heart failure, unspecified: Secondary | ICD-10-CM

## 2018-09-01 NOTE — Telephone Encounter (Signed)
Melissa from [facility] called stating that they received Referral for patient and has to [facility] services for patient because of where he lives along with their office having limited resources.   Called and spoke with patient's WIFE. Informed her. She said she will try to contact insurance and find out if there is another location we can refer patient to for this. Awaiting call back from patient's wife for more information from insurance for PT, OT, and Skilled Nursing in the home.

## 2018-09-07 ENCOUNTER — Telehealth: Payer: Self-pay | Admitting: Internal Medicine

## 2018-09-07 DIAGNOSIS — D801 Nonfamilial hypogammaglobulinemia: Secondary | ICD-10-CM | POA: Insufficient documentation

## 2018-09-07 NOTE — Telephone Encounter (Signed)
Spoke to the wife regarding low immunoglobulin levels interested in IVIG infusions.   Will order IVIG 200 mg/kg dose every 4 weeks.    Please have the patient see me next week/CBC BMP/IVIG infusion.

## 2018-09-08 ENCOUNTER — Inpatient Hospital Stay: Payer: Medicare Other | Attending: Internal Medicine

## 2018-09-08 DIAGNOSIS — C9110 Chronic lymphocytic leukemia of B-cell type not having achieved remission: Secondary | ICD-10-CM | POA: Insufficient documentation

## 2018-09-08 DIAGNOSIS — D809 Immunodeficiency with predominantly antibody defects, unspecified: Secondary | ICD-10-CM | POA: Insufficient documentation

## 2018-09-09 ENCOUNTER — Inpatient Hospital Stay: Payer: Medicare Other

## 2018-09-09 NOTE — Progress Notes (Signed)
Nutrition  Patient was scheduled for nutrition appointment today but did not show up.  Will send message to scheduling to offer another appointment time and to provider.  Sevag Shearn B. Zenia Resides, [place], [place] Registered Dietitian [phone] (pager)

## 2018-09-13 ENCOUNTER — Other Ambulatory Visit: Payer: Self-pay | Admitting: Internal Medicine

## 2018-09-14 ENCOUNTER — Other Ambulatory Visit: Payer: Self-pay | Admitting: Internal Medicine

## 2018-09-14 ENCOUNTER — Encounter: Payer: Self-pay | Admitting: Internal Medicine

## 2018-09-14 DIAGNOSIS — I5023 Acute on chronic systolic (congestive) heart failure: Secondary | ICD-10-CM | POA: Insufficient documentation

## 2018-09-15 ENCOUNTER — Ambulatory Visit (INDEPENDENT_AMBULATORY_CARE_PROVIDER_SITE_OTHER): Payer: Medicare Other | Admitting: Pulmonary Disease

## 2018-09-15 ENCOUNTER — Encounter: Payer: Self-pay | Admitting: Pulmonary Disease

## 2018-09-15 VITALS — BP 110/64 | HR 82 | Ht 73.0 in | Wt 198.0 lb

## 2018-09-15 DIAGNOSIS — C9110 Chronic lymphocytic leukemia of B-cell type not having achieved remission: Secondary | ICD-10-CM

## 2018-09-15 DIAGNOSIS — I428 Other cardiomyopathies: Secondary | ICD-10-CM | POA: Diagnosis not present

## 2018-09-15 DIAGNOSIS — J449 Chronic obstructive pulmonary disease, unspecified: Secondary | ICD-10-CM

## 2018-09-15 DIAGNOSIS — J8489 Other specified interstitial pulmonary diseases: Secondary | ICD-10-CM

## 2018-09-15 MED ORDER — REVEFENACIN 175 MCG/3ML IN SOLN: 1.0000 | Freq: Every day | RESPIRATORY_TRACT | 0 refills | Status: DC

## 2018-09-15 NOTE — Progress Notes (Signed)
Subjective:    Patient ID: Clifford Herrera, male    DOB: Sep 07, 1941, 77 y.o.   MRN: [id number]  HPI  Patient is a 77 year old former smoker (1991) with a history of CLL who follows up with regards to an abnormal CT scan of the chest and dyspnea after a bout of pneumonia.  The patient was initially seen on 25 October.  Subsequently we saw him on 13 November.  The patient was started on treatment for CLL with [facility] on 29 July.  He had the second dose on 30 July and a third dose on 16 September.  To that dose the patient experience issues with cough productive of purulent sputum and generalized malaise. Pneumonia was diagnosed after a chest x-ray was done on 29 September that showed dense retrocardiac infiltrate.  He was continued on antibiotics.  The patient had a CT scan of the chest on 17 October that shows persistence of infiltrate which had the appearance of organizing pneumonia.  He also had mucous plugging on the distal bronchi.Initial interventions included institution of a flutter valve for airway clearance.  He also received steroids, he had to be admitted due to persistent symptoms and failure to thrive after this episode.  He was admitted on 8 November to [facility] and was discharged a day or 2 later.  CT scan of the chest obtained then showed persistence of the infiltrates however by my measurements these were reducing in size.  Confounding the patient's issues are the fact that he had a 2D echo while hospitalized that shows that his ventricular ejection fraction is 20 to 25%.  He had diuretics increased which alleviated his dyspnea significantly.  We saw him on 13 November and at that time he was not markedly improved except his dyspnea was improving.    PET/CT was performed and the infiltrates in question appear to be smaller and the FDG uptake was more consistent with laboratory change.  Presents today for follow-up mostly from a COPD standpoint.  Cough has reduced in [place].  He continues to  produce yellowish sputum.  He is using Brovana and Pulmicort twice a day via nebulizer and cites good relief of symptoms with this.Today the patient presents for follow-up and continues to look markedly improved.    He has not had fevers, chills or sweats recently.  No orthopnea or paroxysmal nocturnal dyspnea.  He is not relying on a wheelchair for ambulation.  His gait is steady.  Today he presents with his wife who also states that she notes marked improvement except for fatigue which has been an issue for him.    He has hematology follow-up with Dr.Brahmanday.    Review of Systems  Constitutional: Positive for fatigue.  HENT: Negative.   Eyes: Negative.   Respiratory: Positive for cough (Productive of yellowish sputum).   Cardiovascular: Positive for palpitations (Occasional).  Gastrointestinal: Negative.   Endocrine: Negative.   Genitourinary: Negative.   Musculoskeletal: Negative.   Neurological: Positive for weakness.  All other systems reviewed and are negative.      Objective:   Physical Exam Vitals signs reviewed.  Constitutional:      General: He is not in acute distress.    Appearance: He is well-developed. He is ill-appearing (Chronically ill-appearing).  HENT:     Head: Normocephalic and atraumatic.     Mouth/Throat:     Mouth: Mucous membranes are moist.     Pharynx: Oropharynx is clear.  Eyes:     Extraocular Movements: Extraocular movements  intact.     Pupils: Pupils are equal, round, and reactive to light.  Neck:     Musculoskeletal: Neck supple.     Thyroid: No thyromegaly.     Vascular: No JVD.     Trachea: No tracheal deviation.  Cardiovascular:     Rate and Rhythm: Normal rate. Rhythm irregular.  No extrasystoles are present. Pulmonary:     Effort: Pulmonary effort is normal. No accessory muscle usage.     Breath sounds: Examination of the right-lower field reveals rhonchi. Rhonchi present. No wheezing or rales.  Abdominal:     General: Abdomen is  flat. There is no distension.  Musculoskeletal: Normal range of motion.  Skin:    General: Skin is warm and dry.     Comments: Multiple ecchymoses  Neurological:     General: No focal deficit present.     Mental Status: He is alert and oriented to person, place, and time.  Psychiatric:        Mood and Affect: Mood normal.        Behavior: Behavior normal.           Assessment & Plan:   1) Organizing Pneumonia: Findings are consistent with organizing pneumonia that is slow to resolve. His PET/CT shows findings more consistent with inflammation.  The infiltrates appear to be decreasing in size.  We will treat as if it where organizing pneumonia and give him 20 mg of prednisone x7 days. Continue pulmonary toilet as before.  We will continue to follow CT of the chest, should do in 3 months.  We will see him in follow-up in 4 to 6 weeks time he is to contact us prior to that time should any new difficulties arise.  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 I suspect that this was the main component of his issues with dyspnea which are now completely resolved.  His dyspnea has resolved.  He is now more plagued with fatigue could be due to his cardiac issues and/or CLL.  This issue adds complexity to his management.  3) Chronic Obstructive Pulmonary Disease:by prior spirometry he is at least GOLD class 2(moderate), he did get some benefit from Symbicort but even with a spacer he was having difficulties dosing the medication. He did well with the nebulizers in the hospital.  He is now on Brovana15g twice a day via nebulizer and Pulmicort 0.25 mg twice a day via nebulizer followed by the use of a flutter valve he feels that this is helping some with his pulmonary toilet.  Though he is fairly compensated he still has room for improvement. We will add Yupelri 1 vial inhaled daily to see if  this optimizes his COPD.  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 He receivedGazyvawith a goal of control/palliation.  For now he appears to be controlled after his setback with pneumonia.  5)Atrial fibrillation:Issue adds complexity to his management.

## 2018-09-15 NOTE — Patient Instructions (Signed)
1.  You will be started on Yupelri 1 vial via nebulizer daily.  You can mix it with Brovana in the morning.  2.  Continue to use Brovana and budesonide (Pulmicort) via nebulizer twice a day.  3.  We will see him in follow-up in 4 to 6 weeks time.  4.  Prescriptions have been sent to [facility] for the nebulizer solutions.  You received Yupelri samples.

## 2018-09-16 ENCOUNTER — Inpatient Hospital Stay: Payer: Medicare Other | Attending: Internal Medicine | Admitting: Internal Medicine

## 2018-09-16 ENCOUNTER — Encounter: Payer: Self-pay | Admitting: Internal Medicine

## 2018-09-16 ENCOUNTER — Inpatient Hospital Stay: Payer: Medicare Other | Attending: Internal Medicine

## 2018-09-16 ENCOUNTER — Other Ambulatory Visit: Payer: Self-pay

## 2018-09-16 DIAGNOSIS — I4891 Unspecified atrial fibrillation: Secondary | ICD-10-CM | POA: Diagnosis not present

## 2018-09-16 DIAGNOSIS — I509 Heart failure, unspecified: Secondary | ICD-10-CM | POA: Insufficient documentation

## 2018-09-16 DIAGNOSIS — R0602 Shortness of breath: Secondary | ICD-10-CM

## 2018-09-16 DIAGNOSIS — R5383 Other fatigue: Secondary | ICD-10-CM

## 2018-09-16 DIAGNOSIS — R05 Cough: Secondary | ICD-10-CM

## 2018-09-16 DIAGNOSIS — E871 Hypo-osmolality and hyponatremia: Secondary | ICD-10-CM | POA: Insufficient documentation

## 2018-09-16 DIAGNOSIS — Z7901 Long term (current) use of anticoagulants: Secondary | ICD-10-CM | POA: Insufficient documentation

## 2018-09-16 DIAGNOSIS — C9110 Chronic lymphocytic leukemia of B-cell type not having achieved remission: Secondary | ICD-10-CM

## 2018-09-16 DIAGNOSIS — D801 Nonfamilial hypogammaglobulinemia: Secondary | ICD-10-CM | POA: Insufficient documentation

## 2018-09-16 DIAGNOSIS — Z87891 Personal history of nicotine dependence: Secondary | ICD-10-CM | POA: Insufficient documentation

## 2018-09-16 DIAGNOSIS — M7989 Other specified soft tissue disorders: Secondary | ICD-10-CM

## 2018-09-16 LAB — CBC WITH DIFFERENTIAL/PLATELET
Abs Immature Granulocytes: 0.08 10*3/uL — ABNORMAL HIGH (ref 0.00–0.07)
BASOS ABS: 0.1 10*3/uL (ref 0.0–0.1)
Basophils Relative: 0 %
Eosinophils Absolute: 0 10*3/uL (ref 0.0–0.5)
Eosinophils Relative: 0 %
HCT: 44.7 % (ref 39.0–52.0)
Hemoglobin: 14.9 g/dL (ref 13.0–17.0)
Immature Granulocytes: 0 %
Lymphocytes Relative: 45 %
Lymphs Abs: 8.3 10*3/uL — ABNORMAL HIGH (ref 0.7–4.0)
MCH: 28.5 pg (ref 26.0–34.0)
MCHC: 33.3 g/dL (ref 30.0–36.0)
MCV: 85.5 fL (ref 80.0–100.0)
Monocytes Absolute: 0.6 10*3/uL (ref 0.1–1.0)
Monocytes Relative: 3 %
NEUTROS ABS: 9.2 10*3/uL — AB (ref 1.7–7.7)
NRBC: 0 % (ref 0.0–0.2)
Neutrophils Relative %: 52 %
Platelets: 129 10*3/uL — ABNORMAL LOW (ref 150–400)
RBC: 5.23 MIL/uL (ref 4.22–5.81)
RDW: 15.5 % (ref 11.5–15.5)
WBC: 18.3 10*3/uL — ABNORMAL HIGH (ref 4.0–10.5)

## 2018-09-16 LAB — COMPREHENSIVE METABOLIC PANEL
ALT: 15 U/L (ref 0–44)
AST: 21 U/L (ref 15–41)
Albumin: 4.1 g/dL (ref 3.5–5.0)
Alkaline Phosphatase: 65 U/L (ref 38–126)
Anion gap: 10 (ref 5–15)
BUN: 29 mg/dL — ABNORMAL HIGH (ref 8–23)
CO2: 31 mmol/L (ref 22–32)
Calcium: 9 mg/dL (ref 8.9–10.3)
Chloride: 93 mmol/L — ABNORMAL LOW (ref 98–111)
Creatinine, Ser: 1.37 mg/dL — ABNORMAL HIGH (ref 0.61–1.24)
GFR calc Af Amer: 57 mL/min — ABNORMAL LOW (ref >60)
GFR calc non Af Amer: 49 mL/min — ABNORMAL LOW (ref >60)
Glucose, Bld: 92 mg/dL (ref 70–99)
Potassium: 3.7 mmol/L (ref 3.5–5.1)
SODIUM: 134 mmol/L — AB (ref 135–145)
Total Bilirubin: 1.3 mg/dL — ABNORMAL HIGH (ref 0.3–1.2)
Total Protein: 6.2 g/dL — ABNORMAL LOW (ref 6.5–8.1)

## 2018-09-16 NOTE — Progress Notes (Signed)
Patient reports low back pain x 2 weeks. Unable to ambulate without much pain for prolonged periods of time. He has been sleeping and sitting in his recliner at home 50% of the day to relieve back pressure. Due to prolonged sitting, patient has developed a sacral wound.  Area of erythema present at sacrum. Area of ulceration (circumferential measurements 1 cm x 1cm) on left sacral check. No signs of exudate.  Allevyn Life Silicone Foam Dressing 7 X 7 Inch Sacral Adhesive with Border Sterile dressing applied on buttock. Education provided that this dressing is good for 7 days. The goal is to minimize further skin breakdown. Educated pt on sacral wound prevention and to avoid sitting for prolonged periods of time. Advised/Educated pt and patient's wife to use barrier cream on pt's sacral area. Wound has been attended to by his daughter, who is a Therapist, sports. Patient will meet with Joli in Dietary to discuss decrease appetite. Educated patient's wife on the importance of good nutrition to promote wound healing.

## 2018-09-16 NOTE — Assessment & Plan Note (Addendum)
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  # Continue to HOLD gazyva at this time- given other acute issues [see below].  CLL stable  # BOOP- s/p prednisone- improving; continue follow up with pulmonary.  Appreciate pulmonary recommendations/follow-up  # Leucocytosis-18 today/lymphocyotosis- neutrophilia- Monitor for now.  No signs of infection.   #Bilateral low back pain- 5/10; no radiation. Heat compresses; ? Positional; hold off x-rays.  Take Tylenol as needed  #Sacral wound-pressure ulcer; addressed by the nursing; continue wound care.  Also awaiting evaluation with dietitian today.  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  We will get one tomorrow.  # History of A. fib on Eliquis-stable  #CHF on Demadex stable  # continue Infectious prophylaxis: Acyclovir ; continue allopurinol.   # Hyponatremia- 128; likely secondary to diuretics.  Improved- at 134.   # DISPOSITION: # infusion tomorrow as planned. # follow up in 1  Months-MD/labs-  CBC/CMP/LDH/infusion- Dr.B

## 2018-09-16 NOTE — Progress Notes (Signed)
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 Nathanial Rancher recently stopped due to pneumonia.  Past medical history of CHF, CLL, implantable cardiac defibrillator. Nurse noted sacral wound today.   Met with wife and patient today in exam room.  Patient shifting positions due to pain from wound.  Wife reports decreased appetite for the last 6 months.  Reports typical day is boiled egg and toast or cereal with milk. Lunch is 1/2 sandwich and supper is meat, vegetable and starch but may only take few bites of each per wife.  Wife also makes him ensure original shake mixed with milk and peanut butter daily.  Overall no appetite.    Noted hospital admission with pneumonia In November.     Nutrition Focused Physical Exam: deferred as patient ready to go home   Medications: dexamethasone, vit d    Labs: Na 134, BUN 29, creatinine 1.37,   Anthropometrics:   Height: 73 inches Weight: 198 lb today UBW: 262 lb about 1 year ago per wife Note weight of 215 lb in 08/2017 per chart BMI: 26  8% weight loss in the last year    Estimated Energy Needs  Kcals: 2300-2700 calories/d Protein: 111-140 g Fluid: per MD due to CHF   NUTRITION DIAGNOSIS: Inadequate oral intake related to poor appetite, cancer treatment side effects as evidenced by 8% weight loss in the last year and decreased appetite   INTERVENTION:  Discussed ways to increase calories and protein. Handout provided Encouraged high calorie oral nutrition supplement shake.  Samples and coupons given today Recommend daily MVI Recommend juven BID to help with wound healing. Samples given to wife today.   MONITORING, EVALUATION, GOAL: weight trends, intake   Next Visit: Jan 30 during infusion  Havoc Sanluis B. Zenia Resides, [place], [place] Registered Dietitian [phone] (pager)

## 2018-09-16 NOTE — Progress Notes (Signed)
[place] OFFICE PROGRESS NOTE  Patient Care Team: [name], MD as PCP - General (Internal Medicine) Corey Skains, MD as Consulting Physician (Cardiology) Cammie Sickle, MD as Medical Oncologist (Hematology and Oncology)  Cancer Staging No matching staging information was found for the patient.   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 Surveillance; IGVH- UNMUTATED [dec 2017]  # AUG 2018- FISH panel analysis was positive for loss of one 13q14 signal. NEGATIVE for CCND1/IGH, ATM, chromosome 12, and TP53 were normal. MAY 2019- : 85% OF NUCLEI POSITIVE FOR A 13Q DELETION  #CHF/CAD s/p Defib- on Eliquis  [Dr.Kowalski]; Orthostatic hypotension   -------------------------------------------------------------   DIAGNOSIS: CLL  STAGE:  IV ;GOALS: CONTROL/palliative  CURRENT/MOST RECENT THERAPY;: July 29th, 2019 GAZYVA      CLL (chronic lymphocytic leukemia) ([place])   04/25/2018 -  Chemotherapy    The patient had obinutuzumab (GAZYVA) 100 mg in sodium chloride 0.9 % 100 mL (0.9615 mg/mL) chemo infusion, 100 mg, Intravenous, Once, 2 of 6 cycles Administration: 100 mg (04/26/2018), 900 mg (04/27/2018), 1,000 mg (06/14/2018), 1,000 mg (06/02/2018)  for chemotherapy treatment.        INTERVAL HISTORY:  Clifford Herrera 77 y.o.  male pleasant patient [name] most recently on [name] is here for follow-up.  Patient currently status post cycle #2.; discontinued secondary to "pneumonia/boop."   Patient states his cough is slightly improved.  Shortness of breath slightly improved.  He was evaluated by pulmonary yesterday.  He is currently status post prednisone.  Appetite fair at best.  He continues to be fatigued.  He has been resting most of his day as per his wife.  Mild swelling in the  legs.  Review of Systems  Constitutional: Positive for malaise/fatigue. Negative for chills, diaphoresis, fever and weight loss.  HENT: Negative for nosebleeds and sore throat.   Eyes: Negative for double vision.  Respiratory: Positive for cough and shortness of breath. Negative for hemoptysis, sputum production and wheezing.   Cardiovascular: Negative for chest pain, palpitations, orthopnea and leg swelling.  Gastrointestinal: Negative for abdominal pain, blood in stool, constipation, diarrhea, heartburn, melena, nausea and vomiting.  Genitourinary: Negative for dysuria, frequency and urgency.  Musculoskeletal: Negative for joint pain.  Skin: Negative.  Negative for itching and rash.  Neurological: Negative for dizziness, tingling, focal weakness, weakness and headaches.  Endo/Heme/Allergies: Bruises/bleeds easily (improving).  Psychiatric/Behavioral: Positive for memory loss. Negative for depression. The patient is not nervous/anxious and does not have insomnia.       PAST MEDICAL HISTORY :  Past Medical History:  Diagnosis Date  . Chronic systolic CHF (congestive heart failure) (HCC)    25%  . CLL (chronic lymphocytic leukemia) ([place])   . COPD (chronic obstructive pulmonary disease) ([place])   . CPAP (continuous positive airway pressure) dependence   . Heart failure ([place])   . HOH (hard of hearing)   . Presence of automatic (implantable) cardiac defibrillator   . Sleep apnea   . Upper respiratory infection    chronic    PAST SURGICAL HISTORY :   Past Surgical History:  Procedure Laterality Date  . CARDIAC DEFIBRILLATOR PLACEMENT  2015    FAMILY HISTORY :   Family History  Problem Relation Age of Onset  . Diabetes Mother   . CAD Mother   . Diabetes Father   . CAD  Father     SOCIAL HISTORY:   Social History   Tobacco Use  . Smoking status: Former Smoker    Packs/day: 2.00    Years: 42.00    Pack years: 84.00    Types: Cigarettes    Last attempt to quit: 1991     Years since quitting: 28.9  . Smokeless tobacco: Current User    Types: Chew  . Tobacco comment: smoking cessation materials not required  Substance Use Topics  . Alcohol use: Yes    Comment: 6 beers per month  . Drug use: No    ALLERGIES:  has No Known Allergies.  MEDICATIONS:  Current Outpatient Medications  Medication Sig Dispense Refill  . acetaminophen (TYLENOL) 500 MG tablet Take 1,000 mg by mouth every 6 (six) hours as needed for mild pain, moderate pain, fever or headache.    Marland Kitchen acyclovir (ZOVIRAX) 400 MG tablet TAKE 1 TABLET BY MOUTH ONCE DAILY TO PREVENT SHINGLES (Patient taking differently: Take 400 mg by mouth daily. ) 30 tablet 3  . albuterol (PROVENTIL HFA;VENTOLIN HFA) 108 (90 Base) MCG/ACT inhaler Inhale 2 puffs into the lungs every 6 (six) hours as needed for wheezing or shortness of breath. 1 Inhaler 2  . allopurinol (ZYLOPRIM) 100 MG tablet TAKE 1 TABLET BY MOUTH ONCE EVERY EVENING (Patient taking differently: Take 100 mg by mouth every evening. ) 30 tablet 5  . ALPRAZolam (XANAX) 0.25 MG tablet Take 1 tablet (0.25 mg total) by mouth 2 (two) times daily as needed for anxiety. 60 tablet 1  . arformoterol (BROVANA) 15 MCG/2ML NEBU Take 2 mLs (15 mcg total) by nebulization 2 (two) times daily. 120 mL 2  . budesonide (PULMICORT) 0.25 MG/2ML nebulizer solution Take 2 mLs (0.25 mg total) by nebulization 2 (two) times daily. 60 mL 12  . Dextromethorphan Polistirex (ROBITUSSIN 12 HOUR COUGH PO) Take 5 mLs by mouth daily.     Marland Kitchen guaiFENesin (MUCINEX) 600 MG 12 hr tablet Take 600 mg by mouth 2 (two) times daily.     Marland Kitchen levothyroxine (SYNTHROID, LEVOTHROID) 75 MCG tablet TAKE 1 TABLET BY MOUTH ONCE DAILY BEFOREBREAKFAST. (Patient taking differently: Take 75 mcg by mouth daily before breakfast. ) 90 tablet 3  . meclizine (ANTIVERT) 25 MG tablet Take 1 tablet (25 mg total) by mouth 2 (two) times daily as needed for dizziness. 30 tablet 0  . metoprolol tartrate (LOPRESSOR) 25 MG tablet  Take 25 mg by mouth 2 (two) times daily.     . montelukast (SINGULAIR) 10 MG tablet TAKE 1 TABLET BY MOUTH AT BEDTIME START 2 DAYS PRIOR TO INFUSION FOR 4 DAYS 30 tablet 2  . Respiratory Therapy Supplies (FLUTTER) DEVI 1 Device by Does not apply route 4 (four) times daily. 1 each 0  . Revefenacin (YUPELRI) 175 MCG/3ML SOLN Inhale 1 ampule into the lungs daily. 3 mL 0  . rOPINIRole (REQUIP) 0.25 MG tablet TAKE 2 TABLETS BY MOUTH AT BEDTIME 60 tablet 0  . sertraline (ZOLOFT) 50 MG tablet Take 1 tablet (50 mg total) by mouth daily. 30 tablet 1  . spironolactone (ALDACTONE) 25 MG tablet TAKE 1/2 TABLET BY MOUTH ONCE DAILY (Patient taking differently: Take 12.5 mg by mouth daily. ) 45 tablet 3  . tamsulosin (FLOMAX) 0.4 MG CAPS capsule Take 1 capsule (0.4 mg total) by mouth daily after supper. 90 capsule 3  . torsemide (DEMADEX) 20 MG tablet Take 1 tablet (20 mg total) by mouth daily. Take 40 mg po bid for 3days and  resume home dose of  20 mg po BID after 3days (Patient taking differently: Take 20 mg by mouth 2 (two) times daily. ) 30 tablet 0  . Vitamin D, Cholecalciferol, 1000 units CAPS Take 2 capsules by mouth daily. (Patient taking differently: Take 2,000 Units by mouth daily. ) 60 capsule   . dexamethasone (DECADRON) 4 MG tablet Take 1 tablet (4 mg total) by mouth daily. Take 1 tablet starting 2 days prior to infusion. Do not take on day of infusion (Patient not taking: Reported on 09/16/2018) 30 tablet 1  . Spacer/Aero-Holding Chambers DEVI 1 Device by Does not apply route 2 (two) times daily. 1 each 0   No current facility-administered medications for this visit.     PHYSICAL EXAMINATION: ECOG PERFORMANCE STATUS: 1 - Symptomatic but completely ambulatory  There were no vitals taken for this visit.  There were no vitals filed for this visit.  Physical Exam  Constitutional: He is oriented to person, place, and time and well-developed, well-nourished, and in no distress.  Accompanied by his  wife.  Is walking himself.  HENT:  Head: Normocephalic and atraumatic.  Mouth/Throat: Oropharynx is clear and moist. No oropharyngeal exudate.  Eyes: Pupils are equal, round, and reactive to light.  Neck: Normal range of motion. Neck supple.  Cardiovascular: Normal rate and regular rhythm.  Pulmonary/Chest: No respiratory distress. He has no wheezes.  Decreased air entry left more than right at the bases.  Abdominal: Soft. Bowel sounds are normal. He exhibits no distension and no mass. There is no abdominal tenderness. There is no rebound and no guarding.  Positive for splenomegaly.  Musculoskeletal: Normal range of motion.        General: No tenderness or edema.  Neurological: He is alert and oriented to person, place, and time.  Skin: Skin is warm.  Multiple bruises chronic-upper lower extremities.  Psychiatric: Affect normal.       LABORATORY DATA:  I have reviewed the data as listed    Component Value Date/Time   NA 134 (L) 09/16/2018 0951   NA 137 12/04/2017 1050   NA 136 07/25/2014 0414   K 3.7 09/16/2018 0951   K 5.6 (H) 07/25/2014 0414   CL 93 (L) 09/16/2018 0951   CL 101 07/25/2014 0414   CO2 31 09/16/2018 0951   CO2 24 07/25/2014 0414   GLUCOSE 92 09/16/2018 0951   GLUCOSE 143 (H) 07/25/2014 0414   BUN 29 (H) 09/16/2018 0951   BUN 15 12/04/2017 1050   BUN 33 (H) 07/25/2014 0414   CREATININE 1.37 (H) 09/16/2018 0951   CREATININE 1.34 (H) 01/15/2015 0956   CALCIUM 9.0 09/16/2018 0951   CALCIUM 8.3 (L) 07/25/2014 0414   PROT 6.2 (L) 09/16/2018 0951   PROT 5.8 (L) 08/17/2017 1551   PROT 6.4 07/24/2014 0937   ALBUMIN 4.1 09/16/2018 0951   ALBUMIN 4.3 08/17/2017 1551   ALBUMIN 4.0 07/24/2014 0937   AST 21 09/16/2018 0951   AST 31 07/24/2014 0937   ALT 15 09/16/2018 0951   ALT 23 07/24/2014 0937   ALKPHOS 65 09/16/2018 0951   ALKPHOS 53 07/24/2014 0937   BILITOT 1.3 (H) 09/16/2018 0951   BILITOT 0.4 08/17/2017 1551   BILITOT 1.1 (H) 07/24/2014 0937    GFRNONAA 49 (L) 09/16/2018 0951   GFRNONAA 52 (L) 01/15/2015 0956   GFRAA 57 (L) 09/16/2018 0951   GFRAA >60 01/15/2015 0956    No results found for: SPEP, UPEP  Lab Results  Component Value Date  WBC 18.3 (H) 09/16/2018   NEUTROABS 9.2 (H) 09/16/2018   HGB 14.9 09/16/2018   HCT 44.7 09/16/2018   MCV 85.5 09/16/2018   PLT 129 (L) 09/16/2018      Chemistry      Component Value Date/Time   NA 134 (L) 09/16/2018 0951   NA 137 12/04/2017 1050   NA 136 07/25/2014 0414   K 3.7 09/16/2018 0951   K 5.6 (H) 07/25/2014 0414   CL 93 (L) 09/16/2018 0951   CL 101 07/25/2014 0414   CO2 31 09/16/2018 0951   CO2 24 07/25/2014 0414   BUN 29 (H) 09/16/2018 0951   BUN 15 12/04/2017 1050   BUN 33 (H) 07/25/2014 0414   CREATININE 1.37 (H) 09/16/2018 0951   CREATININE 1.34 (H) 01/15/2015 0956      Component Value Date/Time   CALCIUM 9.0 09/16/2018 0951   CALCIUM 8.3 (L) 07/25/2014 0414   ALKPHOS 65 09/16/2018 0951   ALKPHOS 53 07/24/2014 0937   AST 21 09/16/2018 0951   AST 31 07/24/2014 0937   ALT 15 09/16/2018 0951   ALT 23 07/24/2014 0937   BILITOT 1.3 (H) 09/16/2018 0951   BILITOT 0.4 08/17/2017 1551   BILITOT 1.1 (H) 07/24/2014 3382       RADIOGRAPHIC STUDIES: I have personally reviewed the radiological images as listed and agreed with the findings in the report. No results found.   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  # Continue to HOLD gazyva at this time- given other acute issues [see below].  CLL stable  # BOOP- s/p prednisone- improving; continue follow up with pulmonary.  Appreciate pulmonary recommendations/follow-up  # Leucocytosis-/ neutrophilia- sec to recent prednisone- monitor  for now.  No signs of infection  #Bilateral low back pain- 5/10; no radiation. Heat compresses; ? Positional; hold off x-rays.  Take Tylenol as needed  #Sacral wound-pressure ulcer; addressed by the nursing; continue wound care.  Also awaiting evaluation with dietitian today.  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  We will get one tomorrow.  # History of A. fib on Eliquis-stable  #CHF on Demadex stable  # continue Infectious prophylaxis: Acyclovir ; continue allopurinol.   # Hyponatremia- 128; likely secondary to diuretics.  Improved- at 134.   # DISPOSITION: # infusion tomorrow as planned. # follow up in 1  Months-MD/labs-  CBC/CMP/LDH/infusion- Dr.B    Orders Placed This Encounter  Procedures  . CBC with Differential/Platelet    Standing Status:   Future    Standing Expiration Date:   09/17/2019  . Comprehensive metabolic panel    Standing Status:   Future    Standing Expiration Date:   09/17/2019  . Lactate dehydrogenase    Standing Status:   Future    Standing Expiration Date:   09/17/2019   All questions were answered. The patient knows to call the clinic with any problems, questions or concerns.      Cammie Sickle, MD 09/16/2018 11:27 AM

## 2018-09-17 ENCOUNTER — Inpatient Hospital Stay: Payer: Medicare Other

## 2018-09-17 ENCOUNTER — Other Ambulatory Visit: Payer: Self-pay | Admitting: Internal Medicine

## 2018-09-17 VITALS — BP 100/65 | HR 66 | Temp 97.0°F | Resp 20

## 2018-09-17 DIAGNOSIS — D809 Immunodeficiency with predominantly antibody defects, unspecified: Secondary | ICD-10-CM | POA: Diagnosis not present

## 2018-09-17 DIAGNOSIS — C9110 Chronic lymphocytic leukemia of B-cell type not having achieved remission: Secondary | ICD-10-CM

## 2018-09-17 DIAGNOSIS — D801 Nonfamilial hypogammaglobulinemia: Secondary | ICD-10-CM

## 2018-09-17 MED ORDER — ACETAMINOPHEN 325 MG PO TABS
650.0000 mg | ORAL_TABLET | Freq: Once | ORAL | Status: AC
  Administered 2018-09-17: 650 mg via ORAL
  Filled 2018-09-17: qty 2

## 2018-09-17 MED ORDER — DIPHENHYDRAMINE HCL 25 MG PO TABS
25.0000 mg | ORAL_TABLET | Freq: Once | ORAL | Status: AC
  Administered 2018-09-17: 25 mg via ORAL
  Filled 2018-09-17: qty 1

## 2018-09-17 MED ORDER — DEXAMETHASONE SODIUM PHOSPHATE 10 MG/ML IJ SOLN
10.0000 mg | Freq: Once | INTRAMUSCULAR | Status: AC
  Administered 2018-09-17: 10 mg via INTRAVENOUS
  Filled 2018-09-17: qty 1

## 2018-09-17 MED ORDER — SODIUM CHLORIDE 0.9 % IV SOLN: 10.0000 mg | Freq: Once | INTRAVENOUS | Status: DC

## 2018-09-17 MED ORDER — IMMUNE GLOBULIN (HUMAN) 10 GM/100ML IV SOLN
200.0000 mg/kg | Freq: Once | INTRAVENOUS | Status: AC
  Administered 2018-09-17: 20 g via INTRAVENOUS
  Filled 2018-09-17: qty 200

## 2018-09-17 MED ORDER — DEXTROSE 5 % IV SOLN
Freq: Once | INTRAVENOUS | Status: AC
  Administered 2018-09-17: 10:00:00 via INTRAVENOUS
  Filled 2018-09-17: qty 250

## 2018-09-17 MED ORDER — FAMOTIDINE IN NACL 20-0.9 MG/50ML-% IV SOLN
20.0000 mg | Freq: Once | INTRAVENOUS | Status: AC
  Administered 2018-09-17: 20 mg via INTRAVENOUS
  Filled 2018-09-17: qty 50

## 2018-09-17 NOTE — Progress Notes (Signed)
Orders were signed. Spoke to pharmacy re: IVFs.

## 2018-09-20 ENCOUNTER — Other Ambulatory Visit: Payer: Self-pay

## 2018-09-20 DIAGNOSIS — J449 Chronic obstructive pulmonary disease, unspecified: Secondary | ICD-10-CM

## 2018-09-20 MED ORDER — ARFORMOTEROL TARTRATE 15 MCG/2ML IN NEBU: 15.0000 ug | INHALATION_SOLUTION | Freq: Two times a day (BID) | RESPIRATORY_TRACT | 2 refills | Status: AC

## 2018-09-20 MED ORDER — BUDESONIDE 0.25 MG/2ML IN SUSP: 0.2500 mg | Freq: Two times a day (BID) | RESPIRATORY_TRACT | 12 refills | Status: AC

## 2018-09-23 ENCOUNTER — Other Ambulatory Visit (INDEPENDENT_AMBULATORY_CARE_PROVIDER_SITE_OTHER): Payer: Medicare Other | Admitting: Internal Medicine

## 2018-09-23 DIAGNOSIS — I2510 Atherosclerotic heart disease of native coronary artery without angina pectoris: Secondary | ICD-10-CM

## 2018-09-23 DIAGNOSIS — Z95810 Presence of automatic (implantable) cardiac defibrillator: Secondary | ICD-10-CM

## 2018-09-23 DIAGNOSIS — J449 Chronic obstructive pulmonary disease, unspecified: Secondary | ICD-10-CM

## 2018-09-23 DIAGNOSIS — I5022 Chronic systolic (congestive) heart failure: Secondary | ICD-10-CM

## 2018-09-23 DIAGNOSIS — N183 Chronic kidney disease, stage 3 unspecified: Secondary | ICD-10-CM

## 2018-09-23 DIAGNOSIS — R2681 Unsteadiness on feet: Secondary | ICD-10-CM

## 2018-09-23 DIAGNOSIS — M6281 Muscle weakness (generalized): Secondary | ICD-10-CM

## 2018-09-23 NOTE — Progress Notes (Signed)
Received orders from [facility]. Initial certification 02/30/17 to 11/14/18. Orders are reviewed, signed and faxed.

## 2018-10-01 ENCOUNTER — Other Ambulatory Visit: Payer: Self-pay | Admitting: Internal Medicine

## 2018-10-05 ENCOUNTER — Telehealth: Payer: Self-pay | Admitting: *Deleted

## 2018-10-05 ENCOUNTER — Encounter: Payer: Self-pay | Admitting: Internal Medicine

## 2018-10-05 NOTE — Telephone Encounter (Signed)
Heather/Brooke-please inform patient that I do not in general prescribe Requip.  I probably just refill his medication that was ordered previously by PCP.  I would defer changes to his doses to his PCP.  Thanks GB

## 2018-10-05 NOTE — Telephone Encounter (Signed)
Dr. Jacinto Reap- patient sent mychart msg requesting to increase dosing of requip. (see my chart msg below). What are your recommendations.  rOPINIRole (REQUIP) 0.25 MG tablet Cammie Sickle, MD]  Patient Comment: I am taking 2 at bedtime every night, but most days I take one midday too. Can you increase the amount dispensed for that? Thank you!

## 2018-10-11 ENCOUNTER — Ambulatory Visit (INDEPENDENT_AMBULATORY_CARE_PROVIDER_SITE_OTHER): Payer: Medicare Other

## 2018-10-11 VITALS — BP 102/62 | HR 85 | Temp 98.1°F | Resp 16 | Ht 73.0 in | Wt 203.6 lb

## 2018-10-11 DIAGNOSIS — Z0000 Encounter for general adult medical examination without abnormal findings: Secondary | ICD-10-CM

## 2018-10-11 NOTE — Progress Notes (Signed)
Subjective:   Clifford Herrera is a 78 y.o. male who presents for Medicare Annual/Subsequent preventive examination.  Review of Systems:   Cardiac Risk Factors include: advanced age (>3men, >43 women);dyslipidemia;male gender;hypertension     Objective:    Vitals: BP 102/62 (BP Location: Left Arm, Patient Position: Sitting, Cuff Size: Normal)   Pulse 85   Temp 98.1 F (36.7 C) (Oral)   Resp 16   Ht 6\' 1"  (1.854 m)   Wt 203 lb 9.6 oz (92.4 kg)   SpO2 96%   BMI 26.86 kg/m   Body mass index is 26.86 kg/m.  Advanced Directives 10/11/2018 09/16/2018 08/25/2018 08/06/2018 07/29/2018 07/12/2018 07/01/2018  Does Patient Have a Medical Advance Directive? No Yes No Yes Yes No Yes  Type of Advance Directive - Out of facility DNR (pink MOST or yellow form) Living will;Healthcare Power of Attorney Living will;Healthcare Power of Attorney Living will;Healthcare Power of Attorney - -  Does patient want to make changes to medical advance directive? - - No - Patient declined No - Patient declined No - Patient declined No - Patient declined No - Patient declined  Copy of [place] in Chart? - - Yes - validated most recent copy scanned in chart (See row information) Yes - validated most recent copy scanned in chart (See row information) Yes - -  Would patient like information on creating a medical advance directive? No - Patient declined No - Patient declined - - - - -    Tobacco Social History   Tobacco Use  Smoking Status Former Smoker  . Packs/day: 2.00  . Years: 42.00  . Pack years: 84.00  . Types: Cigarettes  . Last attempt to quit: 1991  . Years since quitting: 29.0  Smokeless Tobacco Current User  . Types: Chew     Ready to quit: Yes Counseling given: Not Answered   Clinical Intake:  Pre-visit preparation completed: Yes  Pain : No/denies pain     Nutritional Status: BMI 25 -29 Overweight Nutritional Risks: None Diabetes: No  How often do you need to  have someone help you when you read instructions, pamphlets, or other written materials from your doctor or pharmacy?: 1 - Never What is the last grade level you completed in school?: bachelor's science  Interpreter Needed?: No  Information entered by :: Clemetine Marker LPN  Past Medical History:  Diagnosis Date  . Anxiety   . Chronic systolic CHF (congestive heart failure) (H[facility])    25%  . CLL (chronic lymphocytic leukemia) ([place])   . COPD (chronic obstructive pulmonary disease) ([place])   . CPAP (continuous positive airway pressure) dependence   . Heart failure ([place])   . HOH (hard of hearing)   . Presence of automatic (implantable) cardiac defibrillator   . Sleep apnea   . Thyroid disease   . Upper respiratory infection    chronic   Past Surgical History:  Procedure Laterality Date  . CARDIAC DEFIBRILLATOR PLACEMENT  2015   Family History  Problem Relation Age of Onset  . Diabetes Mother   . CAD Mother   . Diabetes Father   . CAD Father    Social History   Socioeconomic History  . Marital status: Married    Spouse name: Clifford Herrera  . Number of children: 3  . Years of education: Not on file  . Highest education level: Bachelor's degree (e.g., BA, AB, BS)  Occupational History  . Occupation: Retired  Scientific laboratory technician  . Emergency planning/management officer  strain: Not hard at all  . Food insecurity:    Worry: Never true    Inability: Never true  . Transportation needs:    Medical: No    Non-medical: No  Tobacco Use  . Smoking status: Former Smoker    Packs/day: 2.00    Years: 42.00    Pack years: 84.00    Types: Cigarettes    Last attempt to quit: 1991    Years since quitting: 29.0  . Smokeless tobacco: Current User    Types: Chew  Substance and Sexual Activity  . Alcohol use: Yes    Comment: 6 beers per month  . Drug use: No  . Sexual activity: Not Currently    Comment: quit about 20 years ago  Lifestyle  . Physical activity:    Days per week: 0 days    Minutes per session: 0  min  . Stress: Not at all  Relationships  . Social connections:    Talks on phone: More than three times a week    Gets together: More than three times a week    Attends religious service: Never    Active member of club or organization: No    Attends meetings of clubs or organizations: Never    Relationship status: Married  Other Topics Concern  . Not on file  Social History Narrative  . Not on file    Outpatient Encounter Medications as of 10/11/2018  Medication Sig  . acetaminophen (TYLENOL) 500 MG tablet Take 1,000 mg by mouth every 6 (six) hours as needed for mild pain, moderate pain, fever or headache.  Marland Kitchen acyclovir (ZOVIRAX) 400 MG tablet TAKE 1 TABLET BY MOUTH ONCE DAILY TO PREVENT SHINGLES (Patient taking differently: Take 400 mg by mouth daily. )  . albuterol (PROVENTIL HFA;VENTOLIN HFA) 108 (90 Base) MCG/ACT inhaler Inhale 2 puffs into the lungs every 6 (six) hours as needed for wheezing or shortness of breath.  . allopurinol (ZYLOPRIM) 100 MG tablet TAKE 1 TABLET BY MOUTH ONCE EVERY EVENING (Patient taking differently: Take 100 mg by mouth every evening. )  . ALPRAZolam (XANAX) 0.25 MG tablet Take 1 tablet (0.25 mg total) by mouth 2 (two) times daily as needed for anxiety.  Marland Kitchen arformoterol (BROVANA) 15 MCG/2ML NEBU Take 2 mLs (15 mcg total) by nebulization 2 (two) times daily.  . budesonide (PULMICORT) 0.25 MG/2ML nebulizer solution Take 2 mLs (0.25 mg total) by nebulization 2 (two) times daily.  Marland Kitchen dexamethasone (DECADRON) 4 MG tablet Take 1 tablet (4 mg total) by mouth daily. Take 1 tablet starting 2 days prior to infusion. Do not take on day of infusion  . Dextromethorphan Polistirex (ROBITUSSIN 12 HOUR COUGH PO) Take 5 mLs by mouth daily.   Marland Kitchen ELIQUIS 5 MG TABS tablet   . guaiFENesin (MUCINEX) 600 MG 12 hr tablet Take 600 mg by mouth 2 (two) times daily.   Marland Kitchen levothyroxine (SYNTHROID, LEVOTHROID) 75 MCG tablet TAKE 1 TABLET BY MOUTH ONCE DAILY BEFOREBREAKFAST. (Patient taking  differently: Take 75 mcg by mouth daily before breakfast. )  . meclizine (ANTIVERT) 25 MG tablet Take 1 tablet (25 mg total) by mouth 2 (two) times daily as needed for dizziness.  . metoprolol succinate (TOPROL-XL) 25 MG 24 hr tablet TAKE 1 TABLET BY MOUTH TWICE DAILY  . metoprolol tartrate (LOPRESSOR) 25 MG tablet Take 25 mg by mouth 2 (two) times daily.   . montelukast (SINGULAIR) 10 MG tablet TAKE 1 TABLET BY MOUTH AT BEDTIME START 2 DAYS PRIOR  TO INFUSION FOR 4 DAYS  . Respiratory Therapy Supplies (FLUTTER) DEVI 1 Device by Does not apply route 4 (four) times daily.  . Revefenacin (YUPELRI) 175 MCG/3ML SOLN Inhale 1 ampule into the lungs daily.  Marland Kitchen rOPINIRole (REQUIP) 0.25 MG tablet TAKE 2 TABLETS BY MOUTH AT BEDTIME  . sertraline (ZOLOFT) 50 MG tablet Take 1 tablet (50 mg total) by mouth daily.  Marland Kitchen Spacer/Aero-Holding Chambers DEVI 1 Device by Does not apply route 2 (two) times daily.  Marland Kitchen spironolactone (ALDACTONE) 25 MG tablet TAKE 1/2 TABLET BY MOUTH ONCE DAILY (Patient taking differently: Take 12.5 mg by mouth daily. )  . tamsulosin (FLOMAX) 0.4 MG CAPS capsule Take 1 capsule (0.4 mg total) by mouth daily after supper.  . torsemide (DEMADEX) 20 MG tablet Take 1 tablet (20 mg total) by mouth daily. Take 40 mg po bid for 3days and resume home dose of  20 mg po BID after 3days (Patient taking differently: Take 20 mg by mouth 2 (two) times daily. )  . Vitamin D, Cholecalciferol, 1000 units CAPS Take 2 capsules by mouth daily. (Patient taking differently: Take 2,000 Units by mouth daily. )   No facility-administered encounter medications on file as of 10/11/2018.     Activities of Daily Living In your present state of health, do you have any difficulty performing the following activities: 10/11/2018 08/06/2018  Hearing? Tempie Donning  Comment has hearing aids but does not use them -  Vision? N N  Comment wears glasses -  Difficulty concentrating or making decisions? N Y  Walking or climbing stairs? N N    Dressing or bathing? N N  Doing errands, shopping? N N  Preparing Food and eating ? N -  Using the Toilet? N -  In the past six months, have you accidently leaked urine? N -  Do you have problems with loss of bowel control? N -  Managing your Medications? N -  Managing your Finances? N -  Housekeeping or managing your Housekeeping? N -  Some recent data might be hidden    Patient Care Team: [name], MD as PCP - General (Internal Medicine) Corey Skains, MD as Consulting Physician (Cardiology) Cammie Sickle, MD as Medical Oncologist (Hematology and Oncology)   Assessment:   This is a routine wellness examination for Clifford Herrera.  Exercise Activities and Dietary recommendations Current Exercise Habits: The patient does not participate in regular exercise at present(physical therapy once weekly), Exercise limited by: cardiac condition(s);respiratory conditions(s)  Goals    . DIET - INCREASE WATER INTAKE     Recommend to drink at least 6-8 8oz glasses of water per day     . Record weight daily     Record daily weight for CHF        Fall Risk Fall Risk  10/11/2018 08/17/2018 07/22/2018 05/03/2018 04/27/2018  Falls in the past year? 1 1 Yes No No  Comment - - - - -  Number falls in past yr: 1 1 2  or more - -  Injury with Fall? 0 0 No - -  Comment - - - - -  Risk Factor Category  Moderate Risk (1 Point) High Risk (2 or more Points) High Fall Risk - -  Risk for fall due to : History of fall(s);Impaired balance/gait History of fall(s);Impaired balance/gait History of fall(s) - -  Risk for fall due to: Comment - - - - -  Follow up Falls prevention discussed;Falls evaluation completed - Falls evaluation completed - -  FALL RISK PREVENTION PERTAINING TO THE HOME:  Any stairs in or around the home WITH handrails? No  Home free of loose throw rugs in walkways, pet beds, electrical cords, etc? Yes  Adequate lighting in your home to reduce risk of falls? Yes    ASSISTIVE DEVICES UTILIZED TO PREVENT FALLS:  Life alert? No  Use of a cane, walker or w/c? No  Grab bars in the bathroom? Yes  Shower chair or bench in shower? Yes  Elevated toilet seat or a handicapped toilet? Yes   DME ORDERS:  DME order needed?  No   TIMED UP AND GO:  Was the test performed? Yes .  Length of time to ambulate 10 feet: 8 sec.   GAIT:  Appearance of gait: Gait slow, steady and without the use of an assistive device.  Education: Fall risk prevention has been discussed.  Intervention(s) required? No   Depression Screen PHQ 2/9 Scores 10/11/2018 08/17/2018 07/22/2018 10/07/2017  PHQ - 2 Score 0 2 0 0  PHQ- 9 Score - 16 - -    Cognitive Function     6CIT Screen 10/11/2018 10/07/2017  What Year? 0 points 0 points  What month? 0 points 0 points  What time? 0 points 0 points  Count back from 20 0 points 0 points  Months in reverse 0 points 0 points  Repeat phrase 0 points 0 points  Total Score 0 0    Immunization History  Administered Date(s) Administered  . Influenza Split 09/30/2011  . Influenza, High Dose Seasonal PF 08/17/2017, 08/17/2018  . Influenza, Seasonal, Injecte, Preservative Fre 07/28/2014  . Influenza-Unspecified 06/27/2016  . Pneumococcal Conjugate-13 08/17/2017  . Pneumococcal Polysaccharide-23 10/20/2013  . Tdap 08/17/2017, 08/06/2018  . Zoster 12/22/2014    Qualifies for Shingles Vaccine? Yes  Zostavax completed 2016. Due for Shingrix. Education has been provided regarding the importance of this vaccine. Pt has been advised to call insurance company to determine out of pocket expense. Advised may also receive vaccine at local pharmacy or Health Dept. Verbalized acceptance and understanding. Pt on list at pharmacy for Shingrix.  Tdap: Up to date  Flu Vaccine: Up to date  Pneumococcal Vaccine: Up to date   Screening Tests Health Maintenance  Topic Date Due  . TETANUS/TDAP  08/06/2028  . INFLUENZA VACCINE  Completed  . PNA  vac Low Risk Adult  Completed   Cancer Screenings:  Colorectal Screening:  No longer required.   Lung Cancer Screening: (Low Dose CT Chest recommended if Age 56-80 years, 30 pack-year currently smoking OR have quit w/in 15years.) does not qualify.   Additional Screening:  Hepatitis C Screening: no longer required  Vision Screening: Recommended annual ophthalmology exams for early detection of glaucoma and other disorders of the eye. Is the patient up to date with their annual eye exam?  Yes  Who is the provider or what is the name of the office in which the pt attends annual eye exams? Dr. Ellin Mayhew  Dental Screening: Recommended annual dental exams for proper oral hygiene  Community Resource Referral:  CRR required this visit?  No       Plan:    I have personally reviewed and addressed the Medicare Annual Wellness questionnaire and have noted the following in the patient's chart:  A. Medical and social history B. Use of alcohol, tobacco or illicit drugs  C. Current medications and supplements D. Functional ability and status E.  Nutritional status F.  Physical activity G. Advance directives H. List  of other physicians I.  Hospitalizations, surgeries, and ER visits in previous 12 months J.  [place] such as hearing and vision if needed, cognitive and depression L. Referrals and appointments   In addition, I have reviewed and discussed with patient certain preventive protocols, quality metrics, and best practice recommendations. A written personalized care plan for preventive services as well as general preventive health recommendations were provided to patient.   Signed,  Clemetine Marker, LPN Nurse Health Advisor   Nurse Notes: Pt is accompanied to visit today by his wife, Clifford Herrera. He complains of increase in symptoms of restless legs. He currently takes 2 tablets requip 0.25 mg qhs and states he has sometimes had to take an extra tablet in the middle of the  afternoon. He would like a new rx for 90 pills sent to [facility] with directions to add tablet if needed. Advised he may need to discuss with Dr. Army Melia. He does have home health PT coming in once weekly to assist with gait and strength training exercises. His wife also stated that he has a sore on his lower back/buttocks area that may be from sitting too much but their daughter, Clifford Herrera is an Therapist, sports and has been keeping an eye on it. I offered to look at it today but he declined. They are also concerned about his chronic cough and have appt for follow up with pulmonology.

## 2018-10-11 NOTE — Patient Instructions (Signed)
Mr. Clifford Herrera , Thank you for taking time to come for your Medicare Wellness Visit. I appreciate your ongoing commitment to your health goals. Please review the following plan we discussed and let me know if I can assist you in the future.   Screening recommendations/referrals: Colonoscopy: no longer required Recommended yearly ophthalmology/optometry visit for glaucoma screening and checkup Recommended yearly dental visit for hygiene and checkup  Vaccinations: Influenza vaccine: done 08/17/18 Pneumococcal vaccine: done 08/17/17 Tdap vaccine: done 08/06/18 Shingles vaccine: Shingrix discussed. Please contact your pharmacy for coverage information.     Advanced directives: Please bring a copy of your health care power of attorney and living will to the office at your convenience.  Conditions/risks identified: Recommend recording daily weight for CHF  Next appointment: Please follow up in one year for your Medicare Annual Wellness visit.    Preventive Care 1 Years and Older, Male Preventive care refers to lifestyle choices and visits with your health care provider that can promote health and wellness. What does preventive care include?  A yearly physical exam. This is also called an annual well check.  Dental exams once or twice a year.  Routine eye exams. Ask your health care provider how often you should have your eyes checked.  Personal lifestyle choices, including:  Daily care of your teeth and gums.  Regular physical activity.  Eating a healthy diet.  Avoiding tobacco and drug use.  Limiting alcohol use.  Practicing safe sex.  Taking low doses of aspirin every day.  Taking vitamin and mineral supplements as recommended by your health care provider. What happens during an annual well check? The services and screenings done by your health care provider during your annual well check will depend on your age, overall health, lifestyle risk factors, and family history of  disease. Counseling  Your health care provider may ask you questions about your:  Alcohol use.  Tobacco use.  Drug use.  Emotional well-being.  Home and relationship well-being.  Sexual activity.  Eating habits.  History of falls.  Memory and ability to understand (cognition).  Work and work Statistician. Screening  You may have the following tests or measurements:  Height, weight, and BMI.  Blood pressure.  Lipid and cholesterol levels. These may be checked every 5 years, or more frequently if you are over 25 years old.  Skin check.  Lung cancer screening. You may have this screening every year starting at age 73 if you have a 30-pack-year history of smoking and currently smoke or have quit within the past 15 years.  Fecal occult blood test (FOBT) of the stool. You may have this test every year starting at age 30.  Flexible sigmoidoscopy or colonoscopy. You may have a sigmoidoscopy every 5 years or a colonoscopy every 10 years starting at age 29.  Prostate cancer screening. Recommendations will vary depending on your family history and other risks.  Hepatitis C blood test.  Hepatitis B blood test.  Sexually transmitted disease (STD) testing.  Diabetes screening. This is done by checking your blood sugar (glucose) after you have not eaten for a while (fasting). You may have this done every 1-3 years.  Abdominal aortic aneurysm (AAA) screening. You may need this if you are a current or former smoker.  Osteoporosis. You may be screened starting at age 14 if you are at high risk. Talk with your health care provider about your test results, treatment options, and if necessary, the need for more tests. Vaccines  Your health care  provider may recommend certain vaccines, such as:  Influenza vaccine. This is recommended every year.  Tetanus, diphtheria, and acellular pertussis (Tdap, Td) vaccine. You may need a Td booster every 10 years.  Zoster vaccine. You may  need this after age 60.  Pneumococcal 13-valent conjugate (PCV13) vaccine. One dose is recommended after age 1.  Pneumococcal polysaccharide (PPSV23) vaccine. One dose is recommended after age 37. Talk to your health care provider about which screenings and vaccines you need and how often you need them. This information is not intended to replace advice given to you by your health care provider. Make sure you discuss any questions you have with your health care provider. Document Released: 10/12/2015 Document Revised: 06/04/2016 Document Reviewed: 07/17/2015 Elsevier Interactive Patient Education  2017 [place] Prevention in the Home Falls can cause injuries. They can happen to people of all ages. There are many things you can do to make your home safe and to help prevent falls. What can I do on the outside of my home?  Regularly fix the edges of walkways and driveways and fix any cracks.  Remove anything that might make you trip as you walk through a door, such as a raised step or threshold.  Trim any bushes or trees on the path to your home.  Use bright outdoor lighting.  Clear any walking paths of anything that might make someone trip, such as rocks or tools.  Regularly check to see if handrails are loose or broken. Make sure that both sides of any steps have handrails.  Any raised decks and porches should have guardrails on the edges.  Have any leaves, snow, or ice cleared regularly.  Use sand or salt on walking paths during winter.  Clean up any spills in your garage right away. This includes oil or grease spills. What can I do in the bathroom?  Use night lights.  Install grab bars by the toilet and in the tub and shower. Do not use towel bars as grab bars.  Use non-skid mats or decals in the tub or shower.  If you need to sit down in the shower, use a plastic, non-slip stool.  Keep the floor dry. Clean up any water that spills on the floor as soon as it  happens.  Remove soap buildup in the tub or shower regularly.  Attach bath mats securely with double-sided non-slip rug tape.  Do not have throw rugs and other things on the floor that can make you trip. What can I do in the bedroom?  Use night lights.  Make sure that you have a light by your bed that is easy to reach.  Do not use any sheets or blankets that are too big for your bed. They should not hang down onto the floor.  Have a firm chair that has side arms. You can use this for support while you get dressed.  Do not have throw rugs and other things on the floor that can make you trip. What can I do in the kitchen?  Clean up any spills right away.  Avoid walking on wet floors.  Keep items that you use a lot in easy-to-reach places.  If you need to reach something above you, use a strong step stool that has a grab bar.  Keep electrical cords out of the way.  Do not use floor polish or wax that makes floors slippery. If you must use wax, use non-skid floor wax.  Do not have throw  rugs and other things on the floor that can make you trip. What can I do with my stairs?  Do not leave any items on the stairs.  Make sure that there are handrails on both sides of the stairs and use them. Fix handrails that are broken or loose. Make sure that handrails are as long as the stairways.  Check any carpeting to make sure that it is firmly attached to the stairs. Fix any carpet that is loose or worn.  Avoid having throw rugs at the top or bottom of the stairs. If you do have throw rugs, attach them to the floor with carpet tape.  Make sure that you have a light switch at the top of the stairs and the bottom of the stairs. If you do not have them, ask someone to add them for you. What else can I do to help prevent falls?  Wear shoes that:  Do not have high heels.  Have rubber bottoms.  Are comfortable and fit you well.  Are closed at the toe. Do not wear sandals.  If you  use a stepladder:  Make sure that it is fully opened. Do not climb a closed stepladder.  Make sure that both sides of the stepladder are locked into place.  Ask someone to hold it for you, if possible.  Clearly mark and make sure that you can see:  Any grab bars or handrails.  First and last steps.  Where the edge of each step is.  Use tools that help you move around (mobility aids) if they are needed. These include:  Canes.  Walkers.  Scooters.  Crutches.  Turn on the lights when you go into a dark area. Replace any light bulbs as soon as they burn out.  Set up your furniture so you have a clear path. Avoid moving your furniture around.  If any of your floors are uneven, fix them.  If there are any pets around you, be aware of where they are.  Review your medicines with your doctor. Some medicines can make you feel dizzy. This can increase your chance of falling. Ask your doctor what other things that you can do to help prevent falls. This information is not intended to replace advice given to you by your health care provider. Make sure you discuss any questions you have with your health care provider. Document Released: 07/12/2009 Document Revised: 02/21/2016 Document Reviewed: 10/20/2014 Elsevier Interactive Patient Education  2017 [employer].

## 2018-10-14 ENCOUNTER — Other Ambulatory Visit: Payer: Self-pay | Admitting: Internal Medicine

## 2018-10-14 DIAGNOSIS — F321 Major depressive disorder, single episode, moderate: Secondary | ICD-10-CM

## 2018-10-15 ENCOUNTER — Other Ambulatory Visit: Payer: Self-pay | Admitting: Pulmonary Disease

## 2018-10-15 ENCOUNTER — Telehealth: Payer: Self-pay

## 2018-10-15 MED ORDER — REVEFENACIN 175 MCG/3ML IN SOLN: 1.0000 | Freq: Every day | RESPIRATORY_TRACT | 0 refills | Status: AC

## 2018-10-15 NOTE — Telephone Encounter (Signed)
Spoke to pt's daughter, Clifford Herrera (DPR), who is requesting sample of yupelri. Clifford Herrera also stated during our conversation that pt's symptoms have not improved since last OV.  Pt seen cardiology this week and a bronch was mentioned.  Pt is experiencing chest congestion, cough with tan mucus & wheezing.   Pt is scheduled for OV with Dr. [name] on 10/27/18. Clifford Herrera has requested sooner appt, due to pt's sx. Pt has been scheduled for sooner f/u on 10/18/18. One sample of yupelri has been placed up front for pickup per request. I have offered to send Rx for yupelri. Heather wished to hold off on yupelri Rx until pt is seen. Nothing further is needed.

## 2018-10-15 NOTE — Telephone Encounter (Signed)
lmtcb x1 for pt's daughter, Heather(DPR).

## 2018-10-15 NOTE — Telephone Encounter (Signed)
Patient daughter Nira Conn called stating patient is prescribed to take ropinirole two tablets at night. He is now having to take extra during the day. Received the OK from Dr [name] to take extra during the day. Reviewed patient chart and seen that Dr [name] is the one who has prescribed this medication the whole time. Told patient daughter that Dr [name] needs to up the dose since he has been the prescriber all along.  Patient daughter said she tried contacting their office and they said he does not normally prescribed this and it needs to come from PCP.   Spoke with Dr Army Melia. Spoke back to daughter Nira Conn and informed her Dr Army Melia said she cannot increase a dose on a medication she has never prescribed without seeing the patient first for documentation. Realized patient has a follow up coming up next week. Told her we can address this then.  Daughter verbalized understanding.

## 2018-10-18 ENCOUNTER — Encounter: Payer: Self-pay | Admitting: Pulmonary Disease

## 2018-10-18 ENCOUNTER — Ambulatory Visit (INDEPENDENT_AMBULATORY_CARE_PROVIDER_SITE_OTHER): Payer: Medicare Other | Admitting: Pulmonary Disease

## 2018-10-18 VITALS — BP 108/68 | HR 124 | Ht 73.0 in | Wt 197.4 lb

## 2018-10-18 DIAGNOSIS — R0600 Dyspnea, unspecified: Secondary | ICD-10-CM

## 2018-10-18 DIAGNOSIS — J449 Chronic obstructive pulmonary disease, unspecified: Secondary | ICD-10-CM

## 2018-10-18 DIAGNOSIS — K219 Gastro-esophageal reflux disease without esophagitis: Secondary | ICD-10-CM

## 2018-10-18 DIAGNOSIS — I428 Other cardiomyopathies: Secondary | ICD-10-CM

## 2018-10-18 DIAGNOSIS — J439 Emphysema, unspecified: Secondary | ICD-10-CM

## 2018-10-18 DIAGNOSIS — J8489 Other specified interstitial pulmonary diseases: Secondary | ICD-10-CM

## 2018-10-18 DIAGNOSIS — C9110 Chronic lymphocytic leukemia of B-cell type not having achieved remission: Secondary | ICD-10-CM

## 2018-10-18 DIAGNOSIS — R05 Cough: Secondary | ICD-10-CM

## 2018-10-18 DIAGNOSIS — R059 Cough, unspecified: Secondary | ICD-10-CM

## 2018-10-18 MED ORDER — PANTOPRAZOLE SODIUM 40 MG PO TBEC: DELAYED_RELEASE_TABLET | ORAL | 1 refills | Status: DC

## 2018-10-18 MED ORDER — HYDROCOD POLST-CPM POLST ER 10-8 MG/5ML PO SUER: 5.0000 mL | Freq: Every evening | ORAL | 0 refills | Status: AC | PRN

## 2018-10-18 NOTE — Progress Notes (Signed)
Subjective:    Patient ID: Clifford Herrera, male    DOB: 26-Apr-1941, 78 y.o.   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  He has also had abnormal CT scan consistent with organizing pneumonia after a bout of pneumonia somewhere around October.  Patient has been on salvage therapy with [name] but this has not been effective.  He developed worsening of his dyspnea and cough and was admitted to [facility] in November and it was noted at that time that he had an ejection fraction of 20% and had decompensation of congestive heart failure.  He had his diuretics increased which have alleviated his dyspnea considerably.  He does have mild to moderate COPD and is currently on Brovana/Pulmicort and Yupelri.  He has noted improvement on his dyspnea with these interventions.  The patient's wife presents with him today she continues to complain bitterly that he is coughing particularly at nighttime.  Of note I have not noted the patient coughing at all over the last 2 visits.  Not had any fevers, chills or sweats.  He has chronic atrial fibrillation and is on anticoagulation for this.   Review of Systems  Constitutional: Positive for appetite change and fatigue. Negative for chills, diaphoresis and fever.  HENT: Negative.   Eyes: Negative.   Respiratory: Positive for cough and shortness of breath.   Cardiovascular: Positive for palpitations and leg swelling.  Gastrointestinal: Negative.   Endocrine: Negative.   Genitourinary: Negative.   Musculoskeletal: Negative.   Skin: Negative.   Allergic/Immunologic: Negative.   Neurological: Positive for weakness.  Hematological: Bruises/bleeds easily.  Psychiatric/Behavioral: Negative.   All other systems reviewed and are negative.      Objective:   Physical Exam Physical Exam Vitals signs reviewed.  Constitutional:    General: He is not in acute distress.    Appearance: He is well-developed. He is ill-appearing (Chronically ill-appearing).  HENT:     Head: Normocephalic and atraumatic.     Mouth/Throat:     Mouth: Mucous membranes are moist.     Pharynx: Oropharynx is clear.  Eyes:     Extraocular Movements: Extraocular movements intact.     Pupils: Pupils are equal, round, and reactive to light.  Neck:     Musculoskeletal: Neck supple.     Thyroid: No thyromegaly.     Vascular: No JVD.     Trachea: No tracheal deviation.  Cardiovascular:     Rate and Rhythm: Normal rate. Rhythm irregular.  No extrasystoles are present. Pulmonary:     Effort: Pulmonary effort is normal. No accessory muscle usage.     Breath sounds: Examination reveals coarse breath sounds present.  Good air entry bilaterally.         No wheezing or rales.  Abdominal:     General: Abdomen is flat. There is no distension.  Musculoskeletal: Normal range of motion.  Skin:    General: Skin is warm and dry.     Comments: Multiple ecchymoses  Neurological:     General: No focal deficit present.     Mental Status: He is alert and oriented to person, place, and time.  Psychiatric:        Mood and Affect: Mood normal.        Behavior: Behavior normal.        Assessment & Plan:  1) Cough: He continues  to complain of cough, this may be related to upper airway cough syndrome or LPR.  His lung exam is actually markedly improved today.  And I do not believe that airway obstruction is causing his cough.  Nevertheless we will obtain full pulmonary function testing to evaluate this issue.  For potential LPR we will treat as below.  We will also provide him with Tussionex to take at bedtime to see if this helps with his nocturnal bouts of cough.  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  He continues to be plagued with fatigue could be  due to his cardiac issues and/or CLL.    I do not believe that this is due to his pulmonary disease.  This issue adds complexity to his management.  3) Chronic Obstructive Pulmonary Disease:by prior spirometry he is at least GOLD class 2(moderate), he did get some benefit from Symbicort but even with a spacer he was having difficulties dosing the medication. He did well with the nebulizers in the hospital.He is now on Brovana15g twice a day via nebulizer and Pulmicort 0.25 mg twice a day via nebulizer followed by the use of a flutter valve hefeels that this is helping some with his pulmonary toilet.    Continue Yupelri as well.  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 He receivedGazyvawith a goal of control/palliation.For now he appears to be controlled after his setback with pneumonia.  5)Atrial fibrillation:Issue adds complexity to his management.  6) LPR: We will give him a trial of pantoprazole to see if this helps with his cough which actually occurs mostly after his evening meal.

## 2018-10-18 NOTE — Patient Instructions (Signed)
1.  We are going to order full breathing test.  2.  We will start you on medication for potential reflux.  Protonix 40 mg before the evening meal.  3.  I will give you a medication that he can take at bedtime to reduce cough.  This is Tussionex.  1 teaspoon before bedtime for cough.  4.  We will see him in follow-up in 2 to 3 weeks time.

## 2018-10-19 ENCOUNTER — Ambulatory Visit: Payer: Medicare Other | Admitting: Internal Medicine

## 2018-10-19 ENCOUNTER — Encounter: Payer: Self-pay | Admitting: Internal Medicine

## 2018-10-19 VITALS — BP 94/58 | HR 97 | Ht 73.0 in | Wt 196.4 lb

## 2018-10-19 DIAGNOSIS — G2581 Restless legs syndrome: Secondary | ICD-10-CM | POA: Diagnosis not present

## 2018-10-19 DIAGNOSIS — F321 Major depressive disorder, single episode, moderate: Secondary | ICD-10-CM

## 2018-10-19 MED ORDER — ROPINIROLE HCL 0.25 MG PO TABS: 0.7500 mg | ORAL_TABLET | Freq: Every day | ORAL | 5 refills | Status: AC

## 2018-10-19 MED ORDER — SERTRALINE HCL 50 MG PO TABS: 50.0000 mg | ORAL_TABLET | Freq: Every day | ORAL | 5 refills | Status: AC

## 2018-10-19 NOTE — Progress Notes (Signed)
Date:  10/19/2018   Name:  Clifford Herrera   DOB:  [birth year]   MRN:  [id number]   Chief Complaint: Depression (Follow up. PGQ9- 17) and Restless leg (Wants to increase Ropinirole. )  Depression         This is a new problem.  The current episode started more than 1 month ago.   The problem has been gradually improving since onset.  Associated symptoms include fatigue.  Associated symptoms include no headaches and no suicidal ideas.( His anxiety and irritability have improved with sertraline)     Exacerbated by: health issues.  Past treatments include SSRIs - Selective serotonin reuptake inhibitors (started 2 months ago - sertraline). RLS - he has had sx for years and has been prescribed 2 requip every evening.  Lately he has noticed some sx during the day of leg twitching and jerking and would like to be able to take a dose at that time.   Chronic cough/SOB -seeing pulmonary, started tussionex last night to help with sleep.  PFTs pending.  Review of Systems  Constitutional: Positive for fatigue. Negative for chills and fever.  Respiratory: Positive for cough, chest tightness and shortness of breath. Negative for wheezing.   Cardiovascular: Positive for leg swelling. Negative for chest pain and palpitations.  Gastrointestinal: Negative for abdominal pain.  Neurological: Negative for dizziness and headaches.  Psychiatric/Behavioral: Positive for depression, dysphoric mood and sleep disturbance. Negative for confusion and suicidal ideas. The patient is not nervous/anxious.     Patient Active Problem List   Diagnosis Date Noted  . Acute on chronic systolic CHF (congestive heart failure), NYHA class 3 ([place]) 09/14/2018  . 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 Current moderate episode of major depressive disorder without prior episode ([place]) 08/17/2018  . Generalized anxiety disorder 08/17/2018  . SOB (shortness of breath) 08/06/2018  . Atherosclerosis of aorta ([place]) 07/21/2018  .  Pneumonia of left lower lobe due to infectious organism ([place]) 06/29/2018  . Hyponatremia 06/29/2018  . Goals of care, counseling/discussion 04/11/2018  . Automatic implantable cardioverter-defibrillator in situ 11/16/2017  . Vitamin D deficiency 09/16/2017  . Hypothyroidism 09/16/2017  . BPH associated with nocturia 08/20/2017  . Allergic rhinitis 08/17/2017  . Gait instability 08/17/2017  . Hypocalcemia 08/17/2017  . CKD (chronic kidney disease) stage 3, GFR 30-59 ml/min (HCC) 08/17/2017  . Gout 08/17/2017  . RLS (restless legs syndrome) 10/23/2016  . 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 Moderate mitral insufficiency 02/21/2015  . Coronary artery disease involving native coronary artery of native heart without angina pectoris 09/18/2014  . Paroxysmal A-fib ([place]) 09/01/2014  . Chronic systolic heart failure ([place]) 07/13/2014  . CLL (chronic lymphocytic leukemia) ([place]) 02/07/2014  . COPD, mild ([place]) 02/07/2014  . OSA on CPAP 02/07/2014  . Polycythemia 02/07/2014  . DJD (degenerative joint disease) of hip 06/24/2011    No Known Allergies  Past Surgical History:  Procedure Laterality Date  . CARDIAC DEFIBRILLATOR PLACEMENT  2015    Social History   Tobacco Use  . Smoking status: Former Smoker    Packs/day: 2.00    Years: 42.00    Pack years: 84.00    Types: Cigarettes    Last attempt to quit: 1991    Years since quitting: 29.0  . Smokeless tobacco: Current User    Types: Chew  Substance Use Topics  . Alcohol use: Yes    Comment: 6 beers per month  . Drug use: No  Medication list has been reviewed and updated.  Current Meds  Medication Sig  . acetaminophen (TYLENOL) 500 MG tablet Take 1,000 mg by mouth every 6 (six) hours as needed for mild pain, moderate pain, fever or headache.  Marland Kitchen acyclovir (ZOVIRAX) 400 MG tablet TAKE 1 TABLET BY MOUTH ONCE DAILY TO PREVENT SHINGLES (Patient taking differently: Take 400 mg by mouth  daily. )  . albuterol (PROVENTIL HFA;VENTOLIN HFA) 108 (90 Base) MCG/ACT inhaler Inhale 2 puffs into the lungs every 6 (six) hours as needed for wheezing or shortness of breath.  . allopurinol (ZYLOPRIM) 100 MG tablet TAKE 1 TABLET BY MOUTH ONCE EVERY EVENING (Patient taking differently: Take 100 mg by mouth every evening. )  . ALPRAZolam (XANAX) 0.25 MG tablet Take 1 tablet (0.25 mg total) by mouth 2 (two) times daily as needed for anxiety.  Marland Kitchen arformoterol (BROVANA) 15 MCG/2ML NEBU Take 2 mLs (15 mcg total) by nebulization 2 (two) times daily.  . budesonide (PULMICORT) 0.25 MG/2ML nebulizer solution Take 2 mLs (0.25 mg total) by nebulization 2 (two) times daily.  . chlorpheniramine-HYDROcodone (TUSSIONEX PENNKINETIC ER) 10-8 MG/5ML SUER Take 5 mLs by mouth at bedtime as needed for cough.  . dexamethasone (DECADRON) 4 MG tablet Take 1 tablet (4 mg total) by mouth daily. Take 1 tablet starting 2 days prior to infusion. Do not take on day of infusion  . ELIQUIS 5 MG TABS tablet   . levothyroxine (SYNTHROID, LEVOTHROID) 75 MCG tablet TAKE 1 TABLET BY MOUTH ONCE DAILY BEFOREBREAKFAST. (Patient taking differently: Take 75 mcg by mouth daily before breakfast. )  . metoprolol succinate (TOPROL-XL) 25 MG 24 hr tablet TAKE 1 TABLET BY MOUTH TWICE DAILY  . montelukast (SINGULAIR) 10 MG tablet TAKE 1 TABLET BY MOUTH AT BEDTIME START 2 DAYS PRIOR TO INFUSION FOR 4 DAYS  . Respiratory Therapy Supplies (FLUTTER) DEVI 1 Device by Does not apply route 4 (four) times daily.  . Revefenacin (YUPELRI) 175 MCG/3ML SOLN Inhale 1 ampule into the lungs daily. (Patient taking differently: Inhale 1 ampule into the lungs 2 (two) times daily. )  . rOPINIRole (REQUIP) 0.25 MG tablet TAKE 2 TABLETS BY MOUTH AT BEDTIME  . sertraline (ZOLOFT) 50 MG tablet TAKE 1 TABLET BY MOUTH ONCE DAILY  . Spacer/Aero-Holding Chambers DEVI 1 Device by Does not apply route 2 (two) times daily.  Marland Kitchen spironolactone (ALDACTONE) 25 MG tablet TAKE 1/2  TABLET BY MOUTH ONCE DAILY (Patient taking differently: Take 12.5 mg by mouth daily. )  . tamsulosin (FLOMAX) 0.4 MG CAPS capsule Take 1 capsule (0.4 mg total) by mouth daily after supper.  . torsemide (DEMADEX) 20 MG tablet Take 1 tablet (20 mg total) by mouth daily. Take 40 mg po bid for 3days and resume home dose of  20 mg po BID after 3days (Patient taking differently: Take 20 mg by mouth 2 (two) times daily. )  . Vitamin D, Cholecalciferol, 1000 units CAPS Take 2 capsules by mouth daily. (Patient taking differently: Take 2,000 Units by mouth daily. )    PHQ 2/9 Scores 10/19/2018 10/11/2018 08/17/2018 07/22/2018  PHQ - 2 Score 3 0 2 0  PHQ- 9 Score 17 - 16 -   Wt Readings from Last 3 Encounters:  10/19/18 196 lb 6.4 oz (89.1 kg)  10/18/18 197 lb 6.4 oz (89.5 kg)  10/11/18 203 lb 9.6 oz (92.4 kg)    Physical Exam Vitals signs and nursing note reviewed.  Constitutional:      General: He is not in  acute distress.    Appearance: He is well-developed.  HENT:     Head: Normocephalic and atraumatic.  Neck:     Musculoskeletal: Normal range of motion and neck supple.  Cardiovascular:     Rate and Rhythm: Normal rate and regular rhythm.  No extrasystoles are present. Pulmonary:     Effort: Pulmonary effort is normal. No respiratory distress.     Breath sounds: Normal breath sounds.     Comments: Frequent loose cough Musculoskeletal: Normal range of motion.     Right lower leg: 1+ Pitting Edema present.     Left lower leg: 1+ Pitting Edema present.  Lymphadenopathy:     Cervical: No cervical adenopathy.  Skin:    General: Skin is warm and dry.     Comments: Chronic skin changes both LEs  Neurological:     Mental Status: He is alert and oriented to person, place, and time.  Psychiatric:        Behavior: Behavior normal.        Thought Content: Thought content normal.     BP (!) 94/58   Pulse 97   Ht 6\' 1"  (1.854 m)   Wt 196 lb 6.4 oz (89.1 kg)   SpO2 97%   BMI 25.91 kg/m     Assessment and Plan: 1. Current moderate episode of major depressive disorder without prior episode (HCC) Improved sx despite no change in PHQ-9 Continue sertraline - sertraline (ZOLOFT) 50 MG tablet; Take 1 tablet (50 mg total) by mouth daily.  Dispense: 30 tablet; Refill: 5  2. RLS (restless legs syndrome) Will add a dose mid-day PRN - rOPINIRole (REQUIP) 0.25 MG tablet; Take 3 tablets (0.75 mg total) by mouth daily.  Dispense: 90 tablet; Refill: 5   Partially dictated using Editor, commissioning. Any errors are unintentional.  Halina Maidens, MD [facility]  10/19/2018

## 2018-10-21 ENCOUNTER — Ambulatory Visit: Payer: Medicare Other | Attending: Pulmonary Disease

## 2018-10-21 DIAGNOSIS — J449 Chronic obstructive pulmonary disease, unspecified: Secondary | ICD-10-CM | POA: Diagnosis not present

## 2018-10-21 MED ORDER — ALBUTEROL SULFATE (2.5 MG/3ML) 0.083% IN NEBU
2.5000 mg | INHALATION_SOLUTION | Freq: Once | RESPIRATORY_TRACT | Status: AC
  Administered 2018-10-21: 2.5 mg via RESPIRATORY_TRACT
  Filled 2018-10-21: qty 3

## 2018-10-22 ENCOUNTER — Telehealth: Payer: Self-pay | Admitting: Pulmonary Disease

## 2018-10-22 NOTE — Telephone Encounter (Signed)
NP is recommending hospice and would like and order to proceed. Also want to know if you would serve as attending?

## 2018-10-25 ENCOUNTER — Encounter: Payer: Self-pay | Admitting: Pulmonary Disease

## 2018-10-25 NOTE — Telephone Encounter (Signed)
I spoke to [place] at [facility] and Palliative Care, let her know of Dr. [name] note. Clifford Herrera stated she has spoken with patient's wife and they have agreed to proceed with Hospice as well as using the Hospice attending. There is nothing more they are needing from [place] at this time.

## 2018-10-25 NOTE — Telephone Encounter (Signed)
Can initiate Hospice. I am available for questions/issues with regards to his COPD but his main issues are severe CM and CLL therefore, I will not be his attending for hospice purposes.

## 2018-10-27 ENCOUNTER — Ambulatory Visit: Payer: Medicare Other | Admitting: Pulmonary Disease

## 2018-10-27 ENCOUNTER — Inpatient Hospital Stay: Payer: Medicare Other

## 2018-10-27 ENCOUNTER — Inpatient Hospital Stay: Payer: Medicare Other | Admitting: Internal Medicine

## 2018-10-27 NOTE — Assessment & Plan Note (Signed)
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  # Continue to HOLD gazyva at this time- given other acute issues [see below].  CLL stable  # BOOP- s/p prednisone- improving; continue follow up with pulmonary.  Appreciate pulmonary recommendations/follow-up  # Leucocytosis-18 today/lymphocyotosis- neutrophilia- Monitor for now.  No signs of infection.   #Bilateral low back pain- 5/10; no radiation. Heat compresses; ? Positional; hold off x-rays.  Take Tylenol as needed  #Sacral wound-pressure ulcer; addressed by the nursing; continue wound care.  Also awaiting evaluation with dietitian today.  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  We will get one tomorrow.  # History of A. fib on Eliquis-stable  #CHF on Demadex stable  # continue Infectious prophylaxis: Acyclovir ; continue allopurinol.   # Hyponatremia- 128; likely secondary to diuretics.  Improved- at 134.   # DISPOSITION: # infusion tomorrow as planned. # follow up in 1  Months-MD/labs-  CBC/CMP/LDH/infusion- Dr.B

## 2018-10-27 NOTE — Progress Notes (Unsigned)
[place] OFFICE PROGRESS NOTE  Patient Care Team: [name], MD as PCP - General (Internal Medicine) Corey Skains, MD as Consulting Physician (Cardiology) Cammie Sickle, MD as Medical Oncologist (Hematology and Oncology)  Cancer Staging No matching staging information was found for the patient.   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 Surveillance; [facility]- UNMUTATED [dec 2017]  # AUG 2018- FISH panel analysis was positive for loss of one 13q14 signal. NEGATIVE for CCND1/IGH, ATM, chromosome 12, and TP53 were normal. MAY 2019- : 85% OF NUCLEI POSITIVE FOR A 13Q DELETION  #CHF/CAD s/p Defib- on Eliquis  [Dr.Kowalski]; Orthostatic hypotension   -------------------------------------------------------------   DIAGNOSIS: CLL  STAGE:  IV ;GOALS: CONTROL/palliative  CURRENT/MOST RECENT THERAPY;: July 29th, 2019 GAZYVA      CLL (chronic lymphocytic leukemia) ([place])   04/26/2018 -  Chemotherapy    The patient had obinutuzumab (GAZYVA) 100 mg in sodium chloride 0.9 % 100 mL (0.9615 mg/mL) chemo infusion, 100 mg, Intravenous, Once, 2 of 6 cycles Administration: 100 mg (04/26/2018), 900 mg (04/27/2018), 1,000 mg (06/14/2018), 1,000 mg (06/02/2018)  for chemotherapy treatment.        INTERVAL HISTORY:  Clifford Herrera 78 y.o.  male pleasant patient [name] most recently on [name] is here for follow-up.  Patient currently status post cycle #2.; discontinued secondary to "pneumonia/boop."   Patient states his cough is slightly improved.  Shortness of breath slightly improved.  He was evaluated by pulmonary yesterday.  He is currently status post prednisone.  Appetite fair at best.  He continues to be fatigued.  He has been resting most of his day as per his wife.  Mild swelling in the  legs.  Review of Systems  Constitutional: Positive for malaise/fatigue. Negative for chills, diaphoresis, fever and weight loss.  HENT: Negative for nosebleeds and sore throat.   Eyes: Negative for double vision.  Respiratory: Positive for cough and shortness of breath. Negative for hemoptysis, sputum production and wheezing.   Cardiovascular: Negative for chest pain, palpitations, orthopnea and leg swelling.  Gastrointestinal: Negative for abdominal pain, blood in stool, constipation, diarrhea, heartburn, melena, nausea and vomiting.  Genitourinary: Negative for dysuria, frequency and urgency.  Musculoskeletal: Negative for joint pain.  Skin: Negative.  Negative for itching and rash.  Neurological: Negative for dizziness, tingling, focal weakness, weakness and headaches.  Endo/Heme/Allergies: Bruises/bleeds easily (improving).  Psychiatric/Behavioral: Positive for memory loss. Negative for depression. The patient is not nervous/anxious and does not have insomnia.       PAST MEDICAL HISTORY :  Past Medical History:  Diagnosis Date  . Anxiety   . Chronic systolic CHF (congestive heart failure) (H[facility])    25%  . CLL (chronic lymphocytic leukemia) ([place])   . COPD (chronic obstructive pulmonary disease) ([place])   . CPAP (continuous positive airway pressure) dependence   . Heart failure ([place])   . HOH (hard of hearing)   . Presence of automatic (implantable) cardiac defibrillator   . Sleep apnea   . Thyroid disease   . Upper respiratory infection    chronic    PAST SURGICAL HISTORY :   Past Surgical History:  Procedure Laterality Date  . CARDIAC DEFIBRILLATOR PLACEMENT  2015    FAMILY HISTORY :   Family History  Problem Relation Age of Onset  . Diabetes Mother   . CAD Mother   .  Diabetes Father   . CAD Father     SOCIAL HISTORY:   Social History   Tobacco Use  . Smoking status: Former Smoker    Packs/day: 2.00    Years: 42.00    Pack years: 84.00    Types: Cigarettes     Last attempt to quit: 1991    Years since quitting: 29.0  . Smokeless tobacco: Current User    Types: Chew  Substance Use Topics  . Alcohol use: Yes    Comment: 6 beers per month  . Drug use: No    ALLERGIES:  has No Known Allergies.  MEDICATIONS:  Current Outpatient Medications  Medication Sig Dispense Refill  . acetaminophen (TYLENOL) 500 MG tablet Take 1,000 mg by mouth every 6 (six) hours as needed for mild pain, moderate pain, fever or headache.    Marland Kitchen acyclovir (ZOVIRAX) 400 MG tablet TAKE 1 TABLET BY MOUTH ONCE DAILY TO PREVENT SHINGLES (Patient taking differently: Take 400 mg by mouth daily. ) 30 tablet 3  . albuterol (PROVENTIL HFA;VENTOLIN HFA) 108 (90 Base) MCG/ACT inhaler Inhale 2 puffs into the lungs every 6 (six) hours as needed for wheezing or shortness of breath. 1 Inhaler 2  . allopurinol (ZYLOPRIM) 100 MG tablet TAKE 1 TABLET BY MOUTH ONCE EVERY EVENING (Patient taking differently: Take 100 mg by mouth every evening. ) 30 tablet 5  . ALPRAZolam (XANAX) 0.25 MG tablet Take 1 tablet (0.25 mg total) by mouth 2 (two) times daily as needed for anxiety. 60 tablet 1  . arformoterol (BROVANA) 15 MCG/2ML NEBU Take 2 mLs (15 mcg total) by nebulization 2 (two) times daily. 120 mL 2  . budesonide (PULMICORT) 0.25 MG/2ML nebulizer solution Take 2 mLs (0.25 mg total) by nebulization 2 (two) times daily. 60 mL 12  . chlorpheniramine-HYDROcodone (TUSSIONEX PENNKINETIC ER) 10-8 MG/5ML SUER Take 5 mLs by mouth at bedtime as needed for cough. 140 mL 0  . dexamethasone (DECADRON) 4 MG tablet Take 1 tablet (4 mg total) by mouth daily. Take 1 tablet starting 2 days prior to infusion. Do not take on day of infusion 30 tablet 1  . ELIQUIS 5 MG TABS tablet     . levothyroxine (SYNTHROID, LEVOTHROID) 75 MCG tablet TAKE 1 TABLET BY MOUTH ONCE DAILY BEFOREBREAKFAST. (Patient taking differently: Take 75 mcg by mouth daily before breakfast. ) 90 tablet 3  . metoprolol succinate (TOPROL-XL) 25 MG 24  hr tablet TAKE 1 TABLET BY MOUTH TWICE DAILY    . montelukast (SINGULAIR) 10 MG tablet TAKE 1 TABLET BY MOUTH AT BEDTIME START 2 DAYS PRIOR TO INFUSION FOR 4 DAYS 30 tablet 2  . Respiratory Therapy Supplies (FLUTTER) DEVI 1 Device by Does not apply route 4 (four) times daily. 1 each 0  . Revefenacin (YUPELRI) 175 MCG/3ML SOLN Inhale 1 ampule into the lungs daily. (Patient taking differently: Inhale 1 ampule into the lungs 2 (two) times daily. ) 7 vial 0  . rOPINIRole (REQUIP) 0.25 MG tablet Take 3 tablets (0.75 mg total) by mouth daily. 90 tablet 5  . sertraline (ZOLOFT) 50 MG tablet Take 1 tablet (50 mg total) by mouth daily. 30 tablet 5  . Spacer/Aero-Holding Chambers DEVI 1 Device by Does not apply route 2 (two) times daily. 1 each 0  . spironolactone (ALDACTONE) 25 MG tablet TAKE 1/2 TABLET BY MOUTH ONCE DAILY (Patient taking differently: Take 12.5 mg by mouth daily. ) 45 tablet 3  . tamsulosin (FLOMAX) 0.4 MG CAPS capsule Take 1 capsule (0.4  mg total) by mouth daily after supper. 90 capsule 3  . torsemide (DEMADEX) 20 MG tablet Take 1 tablet (20 mg total) by mouth daily. Take 40 mg po bid for 3days and resume home dose of  20 mg po BID after 3days (Patient taking differently: Take 20 mg by mouth 2 (two) times daily. ) 30 tablet 0  . Vitamin D, Cholecalciferol, 1000 units CAPS Take 2 capsules by mouth daily. (Patient taking differently: Take 2,000 Units by mouth daily. ) 60 capsule    No current facility-administered medications for this visit.     PHYSICAL EXAMINATION: ECOG PERFORMANCE STATUS: 1 - Symptomatic but completely ambulatory  There were no vitals taken for this visit.  There were no vitals filed for this visit.  Physical Exam  Constitutional: He is oriented to person, place, and time and well-developed, well-nourished, and in no distress.  Accompanied by his wife.  Is walking himself.  HENT:  Head: Normocephalic and atraumatic.  Mouth/Throat: Oropharynx is clear and moist.  No oropharyngeal exudate.  Eyes: Pupils are equal, round, and reactive to light.  Neck: Normal range of motion. Neck supple.  Cardiovascular: Normal rate and regular rhythm.  Pulmonary/Chest: No respiratory distress. He has no wheezes.  Decreased air entry left more than right at the bases.  Abdominal: Soft. Bowel sounds are normal. He exhibits no distension and no mass. There is no abdominal tenderness. There is no rebound and no guarding.  Positive for splenomegaly.  Musculoskeletal: Normal range of motion.        General: No tenderness or edema.  Neurological: He is alert and oriented to person, place, and time.  Skin: Skin is warm.  Multiple bruises chronic-upper lower extremities.  Psychiatric: Affect normal.       LABORATORY DATA:  I have reviewed the data as listed    Component Value Date/Time   NA 134 (L) 09/16/2018 0951   NA 137 12/04/2017 1050   NA 136 07/25/2014 0414   K 3.7 09/16/2018 0951   K 5.6 (H) 07/25/2014 0414   CL 93 (L) 09/16/2018 0951   CL 101 07/25/2014 0414   CO2 31 09/16/2018 0951   CO2 24 07/25/2014 0414   GLUCOSE 92 09/16/2018 0951   GLUCOSE 143 (H) 07/25/2014 0414   BUN 29 (H) 09/16/2018 0951   BUN 15 12/04/2017 1050   BUN 33 (H) 07/25/2014 0414   CREATININE 1.37 (H) 09/16/2018 0951   CREATININE 1.34 (H) 01/15/2015 0956   CALCIUM 9.0 09/16/2018 0951   CALCIUM 8.3 (L) 07/25/2014 0414   PROT 6.2 (L) 09/16/2018 0951   PROT 5.8 (L) 08/17/2017 1551   PROT 6.4 07/24/2014 0937   ALBUMIN 4.1 09/16/2018 0951   ALBUMIN 4.3 08/17/2017 1551   ALBUMIN 4.0 07/24/2014 0937   AST 21 09/16/2018 0951   AST 31 07/24/2014 0937   ALT 15 09/16/2018 0951   ALT 23 07/24/2014 0937   ALKPHOS 65 09/16/2018 0951   ALKPHOS 53 07/24/2014 0937   BILITOT 1.3 (H) 09/16/2018 0951   BILITOT 0.4 08/17/2017 1551   BILITOT 1.1 (H) 07/24/2014 0937   GFRNONAA 49 (L) 09/16/2018 0951   GFRNONAA 52 (L) 01/15/2015 0956   GFRAA 57 (L) 09/16/2018 0951   GFRAA >60 01/15/2015  0956    No results found for: SPEP, UPEP  Lab Results  Component Value Date   WBC 18.3 (H) 09/16/2018   NEUTROABS 9.2 (H) 09/16/2018   HGB 14.9 09/16/2018   HCT 44.7 09/16/2018   MCV 85.5  09/16/2018   PLT 129 (L) 09/16/2018      Chemistry      Component Value Date/Time   NA 134 (L) 09/16/2018 0951   NA 137 12/04/2017 1050   NA 136 07/25/2014 0414   K 3.7 09/16/2018 0951   K 5.6 (H) 07/25/2014 0414   CL 93 (L) 09/16/2018 0951   CL 101 07/25/2014 0414   CO2 31 09/16/2018 0951   CO2 24 07/25/2014 0414   BUN 29 (H) 09/16/2018 0951   BUN 15 12/04/2017 1050   BUN 33 (H) 07/25/2014 0414   CREATININE 1.37 (H) 09/16/2018 0951   CREATININE 1.34 (H) 01/15/2015 0956      Component Value Date/Time   CALCIUM 9.0 09/16/2018 0951   CALCIUM 8.3 (L) 07/25/2014 0414   ALKPHOS 65 09/16/2018 0951   ALKPHOS 53 07/24/2014 0937   AST 21 09/16/2018 0951   AST 31 07/24/2014 0937   ALT 15 09/16/2018 0951   ALT 23 07/24/2014 0937   BILITOT 1.3 (H) 09/16/2018 0951   BILITOT 0.4 08/17/2017 1551   BILITOT 1.1 (H) 07/24/2014 1219       RADIOGRAPHIC STUDIES: I have personally reviewed the radiological images as listed and agreed with the findings in the report. No results found.   ASSESSMENT & PLAN:  No problem-specific Assessment & Plan notes found for this encounter.   No orders of the defined types were placed in this encounter.  All questions were answered. The patient knows to call the clinic with any problems, questions or concerns.      Cammie Sickle, MD 10/27/2018 8:21 AM

## 2018-10-28 ENCOUNTER — Inpatient Hospital Stay: Payer: Medicare Other

## 2018-11-04 ENCOUNTER — Ambulatory Visit: Payer: Medicare Other | Admitting: Pulmonary Disease

## 2018-11-09 ENCOUNTER — Encounter: Payer: Self-pay | Admitting: Internal Medicine

## 2018-11-09 ENCOUNTER — Telehealth: Payer: Self-pay | Admitting: *Deleted

## 2018-11-09 NOTE — Telephone Encounter (Signed)
Rise Paganini called to report that patient expired 11/23/2018

## 2018-11-28 DEATH — deceased

## 2018-12-29 IMAGING — CT CT CHEST W/O CM
2 of 4 series · 15 of 36 positions shown, 18 images · non-contrast
Comparison: 12/30/2017

CLINICAL DATA: History of CLL.  Followup left lung base pneumonia.

EXAM:
CT CHEST WITHOUT CONTRAST
TECHNIQUE: Multidetector CT imaging of the chest was performed following the
standard protocol without IV contrast.

[Series 2: chest · axial · 0.74mm/px · z∈[-1440,-1152]mm · 12 of 172 slices shown, 15 images (1 of 2)]
[im 14/172  mediastinal]
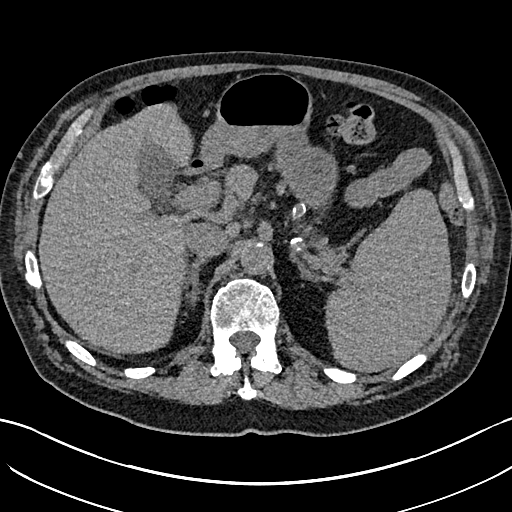
[im 14/172  lung]
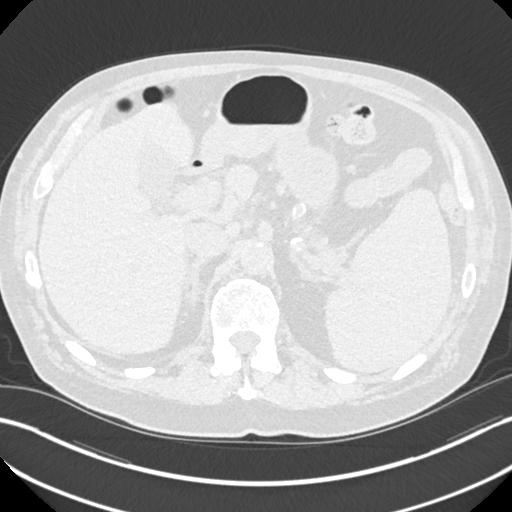
[im 27/172  lung]
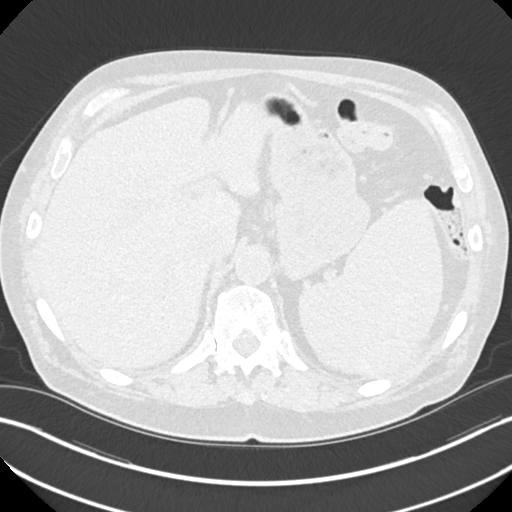
[im 40/172  lung]
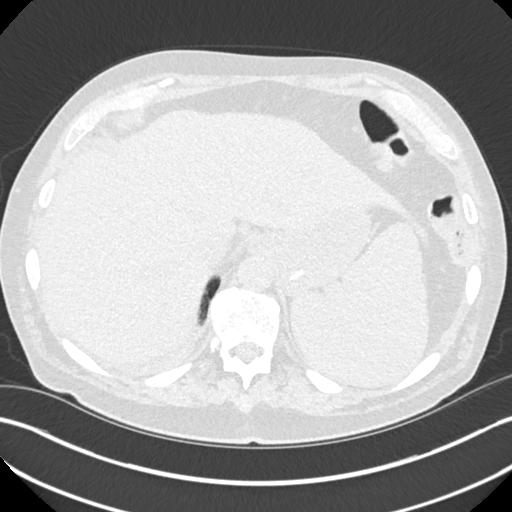
[im 53/172  lung]
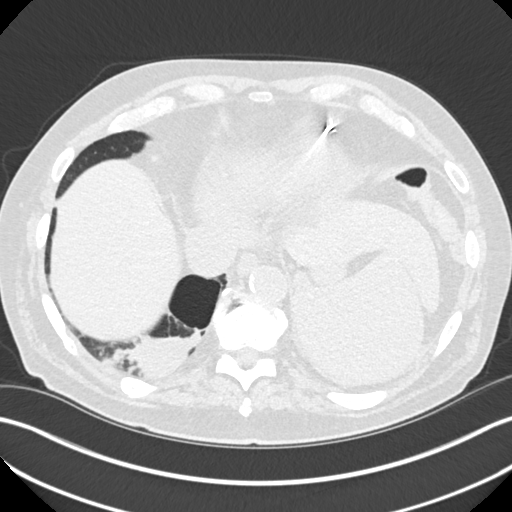
[im 66/172  mediastinal]
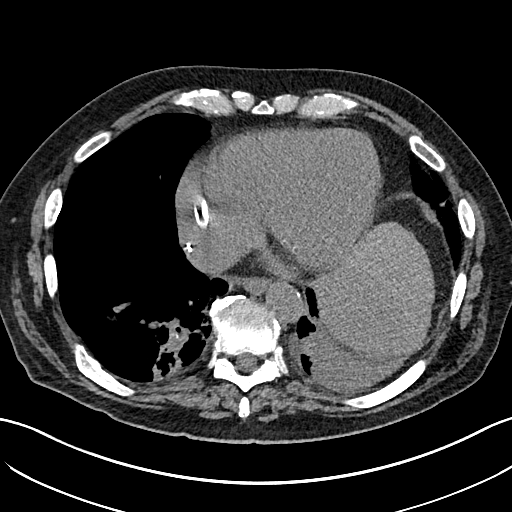
[im 66/172  lung]
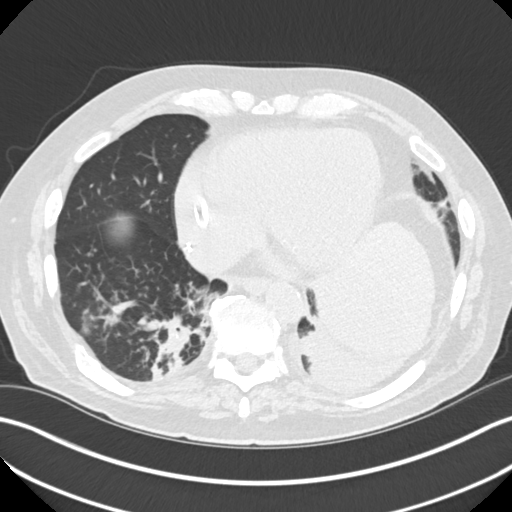
[im 79/172  lung]
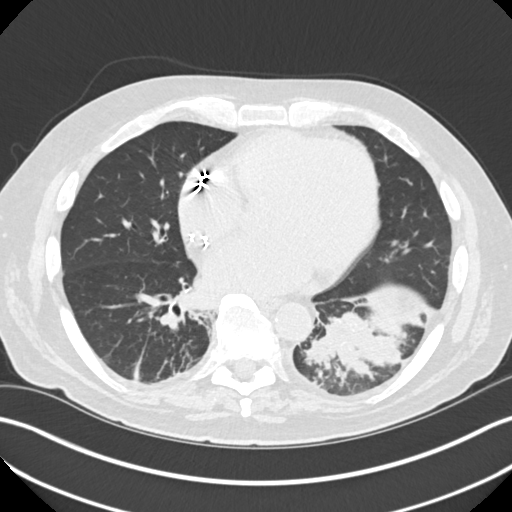
[im 93/172  lung]
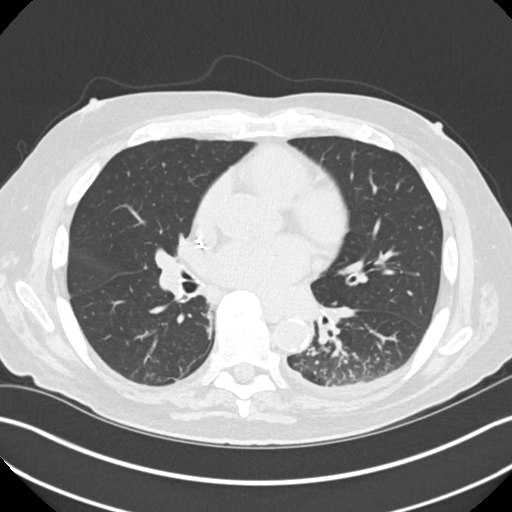
[im 106/172  lung]
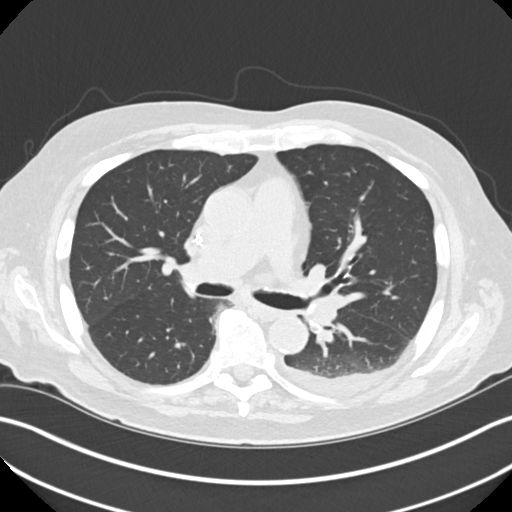
[im 119/172  mediastinal]
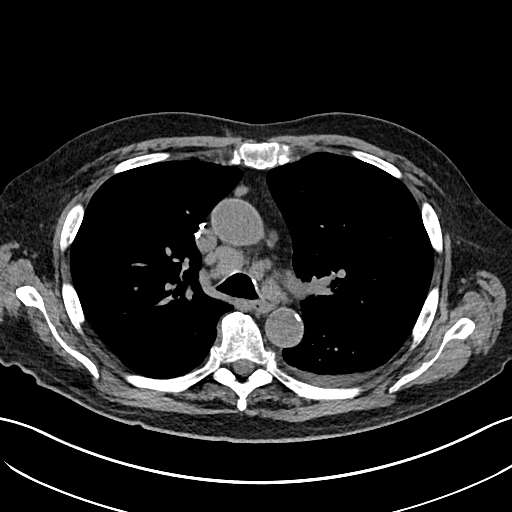
[im 119/172  lung]
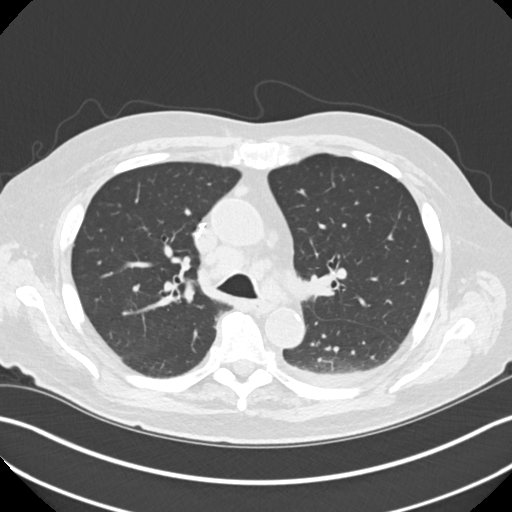
[im 132/172  lung]
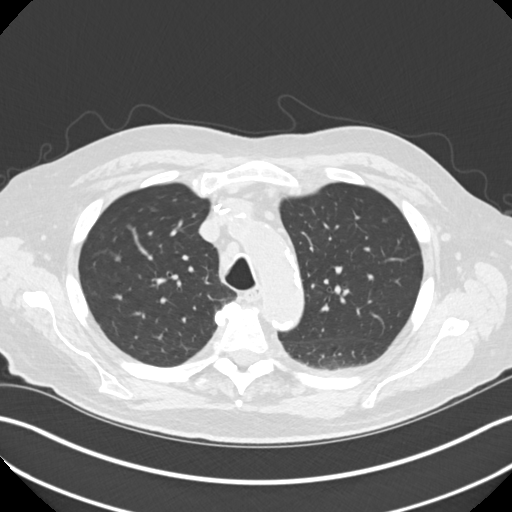
[im 145/172  lung]
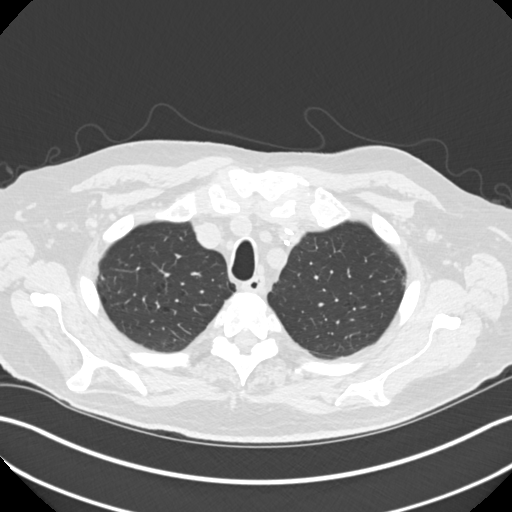
[im 158/172  lung]
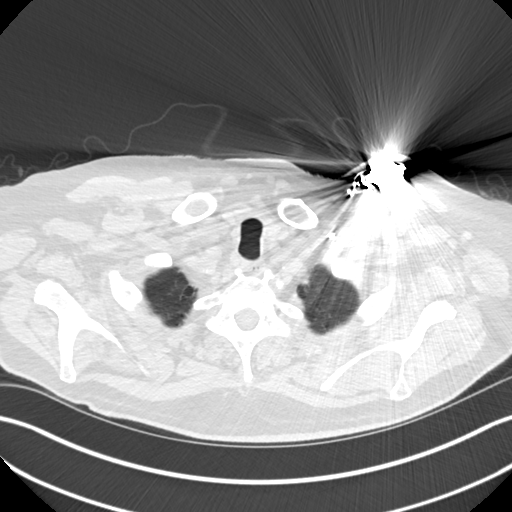

[Series 5: chest · coronal · 0.67mm/px · 3 of 149 slices shown (2 of 2)]
[im 30/149  lung]
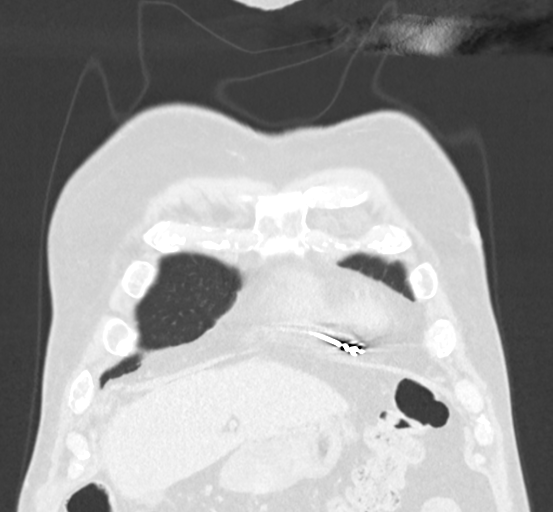
[im 60/149  lung]
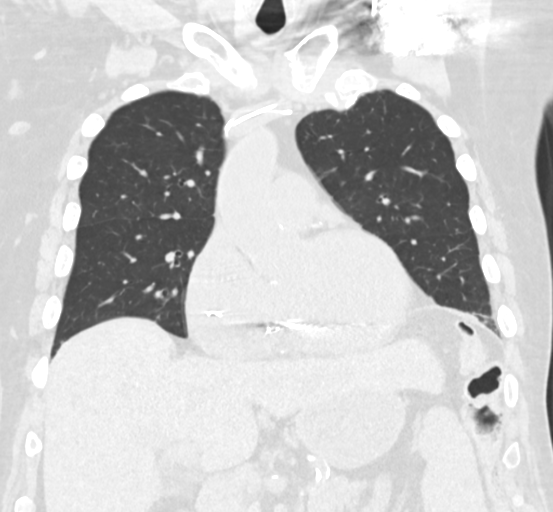
[im 89/149  lung]
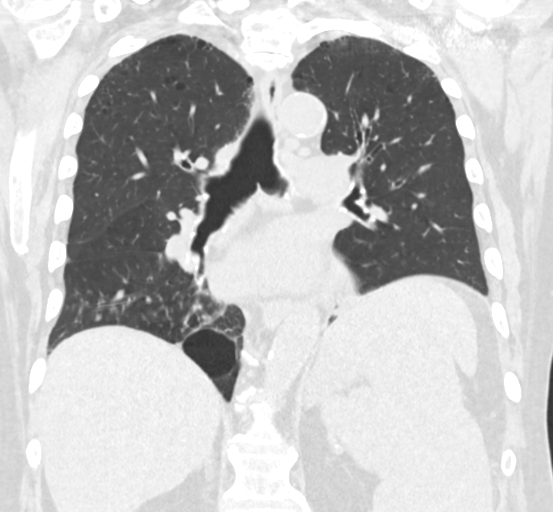

[15 of 36 positions shown; findings below may reference images not displayed]

FINDINGS: Cardiovascular: Moderate cardiac enlargement. Aortic
atherosclerosis. Calcifications in the left main, lad, left
circumflex and RCA coronary arteries noted.

Mediastinum/Nodes: Normal appearance of the thyroid gland. The
trachea appears patent and is midline. Me mediastinal adenopathy is
again noted. Index right paratracheal lymph node measures 2.2 cm,
image 51/2. Previously 2.3 cm. Mild bilateral axillary adenopathy,
index right axillary node measures 1.1 cm. Previously 1.3 cm

Lungs/Pleura: Mild to moderate changes of emphysema. Small left
pleural effusion identified. Large area of masslike architectural
distortion involving much of the left lower lobe is identified with
multiple areas of distal bronchial mucoid impaction and tree-in-bud
nodularity. A similar finding is identified within the posteromedial
right lower lobe. Imaging findings are favored to represent sequelae
of multifocal pneumonia and/or aspiration.

Upper Abdomen: No acute abnormality. Splenomegaly is again noted.
Aortic atherosclerosis.

Musculoskeletal: Spondylosis within the thoracic spine. Unchanged
right 8th, 9th, 10th posttraumatic rib deformities.
IMPRESSION: 1. Persistent bilateral lower lobe opacities, left greater than
right. Findings are favored to represent either multifocal pneumonia
and/or aspiration. Follow-up imaging is recommended to ensure
resolution. If these do not resolve then further investigation with
bronchoscopy may be considered in this patient who is at increased
risk for lung cancer.
2. Stable appearance of mediastinal and axillary adenopathy
compatible with the clinical history of CLL.
3. Aortic Atherosclerosis (YD51O-5N8.8) and Emphysema (YD51O-9O1.P).
4. Coronary artery atherosclerotic calcifications.

## 2019-07-26 ENCOUNTER — Encounter: Payer: Medicare Other | Admitting: Internal Medicine
